# Patient Record
Sex: Female | Born: 1955
Health system: Southern US, Community
[De-identification: ages and names within clinical notes are randomized; demographics above are authoritative.]

## PROBLEM LIST (undated history)

## (undated) DIAGNOSIS — C801 Malignant (primary) neoplasm, unspecified: Secondary | ICD-10-CM

## (undated) DIAGNOSIS — M1990 Unspecified osteoarthritis, unspecified site: Secondary | ICD-10-CM

## (undated) DIAGNOSIS — Z3400 Encounter for supervision of normal first pregnancy, unspecified trimester: Secondary | ICD-10-CM

## (undated) DIAGNOSIS — Z8041 Family history of malignant neoplasm of ovary: Secondary | ICD-10-CM

## (undated) DIAGNOSIS — N189 Chronic kidney disease, unspecified: Secondary | ICD-10-CM

## (undated) DIAGNOSIS — E785 Hyperlipidemia, unspecified: Secondary | ICD-10-CM

## (undated) DIAGNOSIS — Z905 Acquired absence of kidney: Secondary | ICD-10-CM

## (undated) DIAGNOSIS — R002 Palpitations: Secondary | ICD-10-CM

## (undated) DIAGNOSIS — J45909 Unspecified asthma, uncomplicated: Secondary | ICD-10-CM

## (undated) DIAGNOSIS — Z8619 Personal history of other infectious and parasitic diseases: Secondary | ICD-10-CM

## (undated) DIAGNOSIS — Z8719 Personal history of other diseases of the digestive system: Secondary | ICD-10-CM

## (undated) DIAGNOSIS — T7840XA Allergy, unspecified, initial encounter: Secondary | ICD-10-CM

## (undated) DIAGNOSIS — E348 Other specified endocrine disorders: Secondary | ICD-10-CM

## (undated) DIAGNOSIS — M8580 Other specified disorders of bone density and structure, unspecified site: Secondary | ICD-10-CM

## (undated) DIAGNOSIS — Z803 Family history of malignant neoplasm of breast: Secondary | ICD-10-CM

## (undated) DIAGNOSIS — Z808 Family history of malignant neoplasm of other organs or systems: Secondary | ICD-10-CM

## (undated) DIAGNOSIS — H269 Unspecified cataract: Secondary | ICD-10-CM

## (undated) DIAGNOSIS — Z800 Family history of malignant neoplasm of digestive organs: Secondary | ICD-10-CM

## (undated) HISTORY — DX: Allergy, unspecified, initial encounter: T78.40XA

## (undated) HISTORY — DX: Personal history of other diseases of the digestive system: Z87.19

## (undated) HISTORY — DX: Unspecified cataract: H26.9

## (undated) HISTORY — DX: Family history of malignant neoplasm of other organs or systems: Z80.8

## (undated) HISTORY — DX: Malignant (primary) neoplasm, unspecified: C80.1

## (undated) HISTORY — DX: Hyperlipidemia, unspecified: E78.5

## (undated) HISTORY — DX: Unspecified osteoarthritis, unspecified site: M19.90

## (undated) HISTORY — DX: Encounter for supervision of normal first pregnancy, unspecified trimester: Z34.00

## (undated) HISTORY — DX: Family history of malignant neoplasm of ovary: Z80.41

## (undated) HISTORY — DX: Family history of malignant neoplasm of breast: Z80.3

## (undated) HISTORY — DX: Family history of malignant neoplasm of digestive organs: Z80.0

## (undated) HISTORY — DX: Other specified endocrine disorders: E34.8

## (undated) HISTORY — DX: Personal history of other infectious and parasitic diseases: Z86.19

## (undated) HISTORY — DX: Chronic kidney disease, unspecified: N18.9

## (undated) HISTORY — DX: Unspecified asthma, uncomplicated: J45.909

## (undated) HISTORY — DX: Other specified disorders of bone density and structure, unspecified site: M85.80

## (undated) HISTORY — DX: Acquired absence of kidney: Z90.5

---
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
## 1994-02-20 HISTORY — PX: ABDOMINAL HYSTERECTOMY: SHX81

## 1997-09-24 ENCOUNTER — Ambulatory Visit (HOSPITAL_COMMUNITY): Admission: RE | Admit: 1997-09-24 | Discharge: 1997-09-24 | Payer: Self-pay | Admitting: *Deleted

## 1999-02-21 HISTORY — PX: CHOLECYSTECTOMY: SHX55

## 1999-09-27 ENCOUNTER — Encounter: Payer: Self-pay | Admitting: Obstetrics and Gynecology

## 1999-09-27 ENCOUNTER — Encounter: Admission: RE | Admit: 1999-09-27 | Discharge: 1999-09-27 | Payer: Self-pay | Admitting: Obstetrics and Gynecology

## 1999-11-25 ENCOUNTER — Ambulatory Visit (HOSPITAL_COMMUNITY): Admission: RE | Admit: 1999-11-25 | Discharge: 1999-11-25 | Payer: Self-pay

## 1999-11-25 ENCOUNTER — Encounter (INDEPENDENT_AMBULATORY_CARE_PROVIDER_SITE_OTHER): Payer: Self-pay | Admitting: *Deleted

## 1999-12-21 ENCOUNTER — Encounter: Payer: Self-pay | Admitting: Urology

## 1999-12-27 ENCOUNTER — Encounter: Payer: Self-pay | Admitting: Urology

## 1999-12-27 ENCOUNTER — Ambulatory Visit (HOSPITAL_COMMUNITY): Admission: RE | Admit: 1999-12-27 | Discharge: 1999-12-27 | Payer: Self-pay | Admitting: Urology

## 2000-01-23 ENCOUNTER — Encounter: Payer: Self-pay | Admitting: Urology

## 2000-01-23 ENCOUNTER — Ambulatory Visit (HOSPITAL_COMMUNITY): Admission: RE | Admit: 2000-01-23 | Discharge: 2000-01-23 | Payer: Self-pay | Admitting: Urology

## 2000-02-21 HISTORY — PX: OTHER SURGICAL HISTORY: SHX169

## 2000-03-05 ENCOUNTER — Encounter: Payer: Self-pay | Admitting: Urology

## 2000-03-05 ENCOUNTER — Ambulatory Visit (HOSPITAL_COMMUNITY): Admission: RE | Admit: 2000-03-05 | Discharge: 2000-03-05 | Payer: Self-pay | Admitting: Urology

## 2000-04-24 ENCOUNTER — Other Ambulatory Visit: Admission: RE | Admit: 2000-04-24 | Discharge: 2000-04-24 | Payer: Self-pay | Admitting: Obstetrics and Gynecology

## 2002-07-29 ENCOUNTER — Other Ambulatory Visit: Admission: RE | Admit: 2002-07-29 | Discharge: 2002-07-29 | Payer: Self-pay | Admitting: Obstetrics and Gynecology

## 2004-02-21 HISTORY — PX: APPENDECTOMY: SHX54

## 2004-04-01 ENCOUNTER — Encounter (INDEPENDENT_AMBULATORY_CARE_PROVIDER_SITE_OTHER): Payer: Self-pay | Admitting: Specialist

## 2004-04-01 ENCOUNTER — Inpatient Hospital Stay (HOSPITAL_COMMUNITY): Admission: EM | Admit: 2004-04-01 | Discharge: 2004-04-05 | Payer: Self-pay | Admitting: Emergency Medicine

## 2005-05-29 ENCOUNTER — Ambulatory Visit: Payer: Self-pay | Admitting: Internal Medicine

## 2005-06-16 ENCOUNTER — Ambulatory Visit: Payer: Self-pay

## 2005-12-28 ENCOUNTER — Ambulatory Visit: Payer: Self-pay | Admitting: Internal Medicine

## 2005-12-28 LAB — CONVERTED CEMR LAB
ALT: 25 units/L (ref 0–40)
Albumin: 4.1 g/dL (ref 3.5–5.2)
BUN: 21 mg/dL (ref 6–23)
Basophils Absolute: 0 10*3/uL (ref 0.0–0.1)
Basophils Relative: 0.7 % (ref 0.0–1.0)
CO2: 29 meq/L (ref 19–32)
Calcium: 9.5 mg/dL (ref 8.4–10.5)
Chloride: 107 meq/L (ref 96–112)
Chol/HDL Ratio, serum: 3.3
Cholesterol: 173 mg/dL (ref 0–200)
Creatinine, Ser: 0.9 mg/dL (ref 0.4–1.2)
Eosinophil percent: 4.2 % (ref 0.0–5.0)
Free T4: 0.6 ng/dL — ABNORMAL LOW (ref 0.9–1.8)
Glomerular Filtration Rate, Af Am: 86 mL/min/{1.73_m2}
HDL: 52.7 mg/dL (ref >39.0)
Monocytes Relative: 9 % (ref 3.0–11.0)
Neutro Abs: 3.9 10*3/uL (ref 1.4–7.7)
Potassium: 4.2 meq/L (ref 3.5–5.1)
RBC: 4.05 M/uL (ref 3.87–5.11)
Sodium: 142 meq/L (ref 135–145)
Total Protein: 6.9 g/dL (ref 6.0–8.3)
Triglyceride fasting, serum: 88 mg/dL (ref 0–149)

## 2006-02-05 ENCOUNTER — Ambulatory Visit: Payer: Self-pay | Admitting: Internal Medicine

## 2007-01-01 ENCOUNTER — Ambulatory Visit: Payer: Self-pay | Admitting: Internal Medicine

## 2007-09-26 ENCOUNTER — Telehealth (INDEPENDENT_AMBULATORY_CARE_PROVIDER_SITE_OTHER): Payer: Self-pay | Admitting: *Deleted

## 2007-10-23 ENCOUNTER — Telehealth (INDEPENDENT_AMBULATORY_CARE_PROVIDER_SITE_OTHER): Payer: Self-pay | Admitting: *Deleted

## 2007-11-15 ENCOUNTER — Encounter: Payer: Self-pay | Admitting: Internal Medicine

## 2007-12-31 ENCOUNTER — Encounter: Payer: Self-pay | Admitting: Internal Medicine

## 2008-01-02 ENCOUNTER — Encounter: Payer: Self-pay | Admitting: Internal Medicine

## 2008-01-28 ENCOUNTER — Ambulatory Visit: Payer: Self-pay | Admitting: Internal Medicine

## 2008-01-28 DIAGNOSIS — J309 Allergic rhinitis, unspecified: Secondary | ICD-10-CM | POA: Insufficient documentation

## 2008-01-28 DIAGNOSIS — J029 Acute pharyngitis, unspecified: Secondary | ICD-10-CM | POA: Insufficient documentation

## 2008-01-28 DIAGNOSIS — M79609 Pain in unspecified limb: Secondary | ICD-10-CM

## 2008-01-28 DIAGNOSIS — M8580 Other specified disorders of bone density and structure, unspecified site: Secondary | ICD-10-CM

## 2008-02-05 ENCOUNTER — Ambulatory Visit: Payer: Self-pay | Admitting: Internal Medicine

## 2008-02-10 ENCOUNTER — Encounter: Payer: Self-pay | Admitting: Internal Medicine

## 2008-02-10 LAB — CONVERTED CEMR LAB
ALT: 25 units/L (ref 0–35)
AST: 25 units/L (ref 0–37)
Albumin: 4 g/dL (ref 3.5–5.2)
Alkaline Phosphatase: 54 units/L (ref 39–117)
BUN: 15 mg/dL (ref 6–23)
Basophils Absolute: 0 10*3/uL (ref 0.0–0.1)
Chloride: 109 meq/L (ref 96–112)
Eosinophils Relative: 5.1 % — ABNORMAL HIGH (ref 0.0–5.0)
GFR calc Af Amer: 114 mL/min
GFR calc non Af Amer: 94 mL/min
Glucose, Bld: 98 mg/dL (ref 70–99)
HCT: 35.9 % — ABNORMAL LOW (ref 36.0–46.0)
Lymphocytes Relative: 42.8 % (ref 12.0–46.0)
MCHC: 35.1 g/dL (ref 30.0–36.0)
MCV: 99.1 fL (ref 78.0–100.0)
Monocytes Relative: 8.1 % (ref 3.0–12.0)
Platelets: 206 10*3/uL (ref 150–400)
RBC: 3.62 M/uL — ABNORMAL LOW (ref 3.87–5.11)
RDW: 12.6 % (ref 11.5–14.6)
Sodium: 145 meq/L (ref 135–145)
Total CHOL/HDL Ratio: 2.6
Total Protein: 6.8 g/dL (ref 6.0–8.3)

## 2008-03-18 ENCOUNTER — Ambulatory Visit: Payer: Self-pay | Admitting: Gastroenterology

## 2008-03-19 ENCOUNTER — Ambulatory Visit: Payer: Self-pay | Admitting: Sports Medicine

## 2008-03-19 DIAGNOSIS — M216X9 Other acquired deformities of unspecified foot: Secondary | ICD-10-CM

## 2008-03-19 DIAGNOSIS — M722 Plantar fascial fibromatosis: Secondary | ICD-10-CM

## 2008-04-01 ENCOUNTER — Ambulatory Visit: Payer: Self-pay | Admitting: Gastroenterology

## 2008-04-01 LAB — HM COLONOSCOPY

## 2008-06-18 ENCOUNTER — Telehealth (INDEPENDENT_AMBULATORY_CARE_PROVIDER_SITE_OTHER): Payer: Self-pay | Admitting: *Deleted

## 2008-12-21 LAB — HM MAMMOGRAPHY: HM Mammogram: NORMAL

## 2009-08-24 ENCOUNTER — Emergency Department (HOSPITAL_COMMUNITY): Admission: EM | Admit: 2009-08-24 | Discharge: 2009-08-24 | Payer: Self-pay | Admitting: Family Medicine

## 2009-08-30 ENCOUNTER — Telehealth: Payer: Self-pay | Admitting: *Deleted

## 2009-10-13 ENCOUNTER — Encounter: Payer: Self-pay | Admitting: Internal Medicine

## 2009-12-29 ENCOUNTER — Telehealth: Payer: Self-pay | Admitting: *Deleted

## 2009-12-29 ENCOUNTER — Ambulatory Visit: Payer: Self-pay | Admitting: Internal Medicine

## 2009-12-29 LAB — CONVERTED CEMR LAB
ALT: 22 units/L (ref 0–35)
AST: 22 units/L (ref 0–37)
Albumin: 4.2 g/dL (ref 3.5–5.2)
Alkaline Phosphatase: 62 units/L (ref 39–117)
BUN: 21 mg/dL (ref 6–23)
Basophils Relative: 0.5 % (ref 0.0–3.0)
Calcium: 9.9 mg/dL (ref 8.4–10.5)
Cholesterol: 170 mg/dL (ref 0–200)
GFR calc non Af Amer: 71.5 mL/min (ref >60)
HCT: 41 % (ref 36.0–46.0)
HDL: 66.2 mg/dL (ref >39.00)
Lymphs Abs: 2.7 10*3/uL (ref 0.7–4.0)
MCHC: 33.9 g/dL (ref 30.0–36.0)
Monocytes Relative: 6.7 % (ref 3.0–12.0)
Neutro Abs: 5.6 10*3/uL (ref 1.4–7.7)
RBC: 4.11 M/uL (ref 3.87–5.11)
RDW: 13.8 % (ref 11.5–14.6)
Specific Gravity, Urine: 1.02
Total Bilirubin: 0.4 mg/dL (ref 0.3–1.2)
Total Protein: 6.6 g/dL (ref 6.0–8.3)
Triglycerides: 82 mg/dL (ref 0.0–149.0)
Urobilinogen, UA: 0.2
VLDL: 16.4 mg/dL (ref 0.0–40.0)
WBC: 9.2 10*3/uL (ref 4.5–10.5)
pH: 5

## 2010-01-05 ENCOUNTER — Encounter: Payer: Self-pay | Admitting: Internal Medicine

## 2010-01-05 ENCOUNTER — Ambulatory Visit: Payer: Self-pay | Admitting: Internal Medicine

## 2010-01-05 DIAGNOSIS — R209 Unspecified disturbances of skin sensation: Secondary | ICD-10-CM

## 2010-01-05 DIAGNOSIS — R6882 Decreased libido: Secondary | ICD-10-CM

## 2010-01-12 LAB — CONVERTED CEMR LAB: Folate: >20 ng/mL

## 2010-02-03 ENCOUNTER — Encounter: Payer: Self-pay | Admitting: Internal Medicine

## 2010-03-04 ENCOUNTER — Ambulatory Visit
Admission: RE | Admit: 2010-03-04 | Discharge: 2010-03-04 | Payer: Self-pay | Source: Home / Self Care | Attending: Family Medicine | Admitting: Family Medicine

## 2010-03-24 NOTE — Assessment & Plan Note (Signed)
Summary: cpx/no pap/njr   Vital Signs:  Patient profile:   55 year old female Menstrual status:  hysterectomy Height:      64.25 inches Weight:      134 pounds BMI:     22.90 Pulse rate:   72 / minute BP sitting:   100 / 70  (right arm) Cuff size:   regular  Vitals Entered By: Romualdo Bolk, CMA (AAMA) (January 05, 2010 11:28 AM) CC: CPX     Menstrual Status hysterectomy   History of Present Illness: Maria Kane comes in for a preventive visit today. She has a list of concerns and questions. 1. She has decrease libido question about her hormone replacement. She had  complete      hysterectomy    age 17 .   and since that time has been on  low dose hrt   two times a week  for years  . when she tookhigher dose   got vertigo.  she's been tolerating Vivelle dot for a while.  Wondered if different formants would help libido. She denies any physical pain does have some depression that she treats with over-the-counter CME does not want to take prescription medicine for this. Does [place] exercise recently.  Bone healtth: Due for  bone  density in a few weeks.  Left foot:  has had some pain in her left lateral foot with numbness along. Outside for a while did see Dr. Darrick Penna in the past who gave her an insert but it isn't better. She did not go back for follow-up Solitary kidney:  assess funcxtion. Numbness in left hand palm at times. She takes a sublingual B vitamin and it gets better for a while. There is no weakness balance problems. Current vertigo. She asks if B12 shots would be helpful. Hx of pineal cyst : incidental finding on scan per Dr. Jule Ser. She asks if it could be causing a sleep problem. Review of her supplements include fish oil multivitamins CMA B12 to baby aspirins that she takes because she read it would be good for her and melatonin 18 mg at night.  Preventive Care Screening  Mammogram:    Date:  12/21/2008    Results:  normal   Last Tetanus Booster:  Date:  02/21/2003    Results:  Historical    Preventive Screening-Counseling & Management  Alcohol-Tobacco     Alcohol drinks/day: 1     Alcohol type: wine     Smoking Status: never  Caffeine-Diet-Exercise     Caffeine use/day: 2     Does Patient Exercise: yes     Type of exercise: Yoga. wts and cardio     Times/week: 4  Hep-HIV-STD-Contraception     Dental Visit-last 6 months yes     Sun Exposure-Excessive: no  Safety-Violence-Falls     Seat Belt Use: yes     Firearms in the Home: no firearms in the home     Smoke Detectors: yes     Fall Risk: no      Blood Transfusions:  no.    Current Medications (verified): 1)  Vivelle-Dot 0.025 Mg/24hr Pttw (Estradiol) 2)  Claritin 10 Mg Tabs (Loratadine)  Allergies (verified): No Known Drug Allergies  Past History:  Past medical, surgical, family and social histories (including risk factors) reviewed, and no changes noted (except as noted below).  Past Medical History: Allergic rhinitis   dogs and dust  tree ragweek.  Osteopenia    dexa 11/09 -1.4 spine , -1.2 hip  Primiparous Chicken pox as a child Last PAP 2008 Mammo 11/09 Single  kidney   Cyst on pineal gland  incidental finding.   CONSULTANTS  Kimb[facility] Joycie Peek  Past Surgical History: Reviewed history from 01/28/2008 and no changes required. Appendectomy 2006 Cholecystectomy2001 Hysterectomy   Total severe endometriosis  39 1996 Tonsillectomy1965 Left Kidney removed Congential UPJ obstruction 2002.   Past History:  Care Management: Gastroenterology: [facility] Dermatology: Yetta Barre Gynecology: Rosalio Macadamia Allergist : sharma   Family History: Reviewed history from 01/28/2008 and no changes required. Family History Breast cancer 1st degree relative <50-Mother Family History Diabetes 1st degree relative- Brother,sister, maternal aunts and grandfather Family History Hypertension-brother, sister and father Family History Lung cancer-  father Family History Psychiatric care- 1/2 brother- schizopherenia Family History of Stroke M 1st degree relative <50- father, maternal grandfather Family History of Cardiovascular disorder- father-pacemaker Family History of Osteoarthritis-mother and maternal aunts Family History of Polymyositis-sister Father: Lung cancer died 51 Mother:  MS   died at 30 from pneumonia Siblings:  10 sibs   see above   Social History: Reviewed history from 01/28/2008 and no changes required. Occupation: Ph.D- Location manager Married HH of 4   Never Smoked Alcohol use-yes  3  2 x per week.  Drug use-no Regular exercise-yes 2 adopted kids  2 dog s  originally from [place] Caffeine use/day:  2 Seat Belt Use:  yes Sun Exposure-Excessive:  no Dental Care w/in 6 mos.:  yes Fall Risk:  no Blood Transfusions:  no  Review of Systems  The patient denies anorexia, fever, weight loss, vision loss, decreased hearing, hoarseness, chest pain, syncope, dyspnea on exertion, peripheral edema, prolonged cough, headaches, abdominal pain, severe indigestion/heartburn, hematuria, muscle weakness, transient blindness, enlarged lymph nodes, and angioedema.         numbness   left hand    off an on through out life  palm area. better with sublinqual b  helps.   see HPI  Physical Exam  General:  Well-developed,well-nourished,in no acute distress; alert,appropriate and cooperative throughout examination Head:  normocephalic, atraumatic, and no abnormalities observed.   Eyes:  PERRL, EOMs full, conjunctiva clear  Ears:  R ear normal, L ear normal, and no external deformities.   Nose:  no external deformity, no external erythema, and no nasal discharge.   Mouth:  good dentition and pharynx pink and moist.   Neck:  No deformities, masses, or tenderness noted. Chest Wall:  No deformities, masses, or tenderness noted. Breasts:  No mass, nodules, thickening, tenderness, bulging, retraction, inflamation, nipple  discharge or skin changes noted.   Lungs:  Normal respiratory effort, chest expands symmetrically. Lungs are clear to auscultation, no crackles or wheezes.no dullness.   Heart:  Normal rate and regular rhythm. S1 and S2 normal without gallop, murmur, click, rub or other extra sounds.no lifts.   Abdomen:  Bowel sounds positive,abdomen soft and non-tender without masses, organomegaly or hernias noted.  well healed scars. Genitalia:  per gyne Msk:  no joint swelling, no joint warmth, and no joint deformities.   Pulses:  pulses intact without delay   Extremities:  no clubbing cyanosis or edema  Neurologic:  alert & oriented X3, strength normal in all extremities, and gait normal.  decreased vibratory sense along the left lateral foot and toe. No atrophy or weakness. Skin:  turgor normal, color normal, no ecchymoses, and no petechiae.   Cervical Nodes:  No lymphadenopathy noted Axillary Nodes:  No palpable lymphadenopathy Inguinal Nodes:  No significant adenopathy  Psych:  Oriented X3, normally interactive, good eye contact, not anxious appearing, and not depressed appearing.     Impression & Recommendations:  Problem # 1:  PREVENTIVE HEALTH CARE (ICD-V70.0) continue     healthy lifestyle .   counseled   pineal cysts not  significant   Problem # 2:  LIBIDO, DECREASED (ICD-799.81) disc  complexity of problem   and usually not a simple  fix.  and risk of testosterone  now that she  is menopausal .     consider increase aerobi exercise  and other   can get gyne to evaluated if wishes.   Problem # 3:  MENOPAUSE, SURGICAL AGE 67 (ICD-627.4) to stay on same dose  . gets vertigo with higher doses  Her updated medication list for this problem includes:    Vivelle-dot 0.025 Mg/24hr Pttw (Estradiol)  Problem # 4:  HYPERPOTASSEMIA (ICD-276.7) probably lab drawing effect  Orders: TLB-Potassium (K+) (84132-K) Venipuncture (13086) Specimen Handling (57846)  Problem # 5:  NUMBNESS (ICD-782.0) seems  to be a local phenomenon on the left although there is decrease vibratory sense on her feet is reasonable to check a b12 level and MMA MMA Orders: TLB-B12 + Folate Pnl ([id number]-K44/WNU) T- * Misc. Laboratory test [id number]) Venipuncture [phone]) Specimen Handling (34742)  Problem # 6:  OSTEOPENIA (ICD-733.90) counseled   Problem # 7:  SOLITARY KIDNEY RIGHT (ICD-753.0) normal renal function reviewed avoiding nephrotoxic agents.  Problem # 8:  counseling supplement used discussed at length   minimalk evidence based clinical outcomes  of  data or lack of data behind fish oil vitamins B12  melatonin  etc.   Discussed risk benefit  .    she can continue as she feels appropriate.  It is much better to get nutrients in foods and in pills.   Complete Medication List: 1)  Vivelle-dot 0.025 Mg/24hr Pttw (Estradiol) 2)  Claritin 10 Mg Tabs (Loratadine) 3)  Melatonin 3 Mg Tabs (Melatonin) .... ? dose 4)  B12  5)  Fish Oil  6)  Aspirin 81 Mg Chew (Aspirin) .... 2 per day 7)  Herbal For Depression   Patient Instructions: 1)  increase exercise ( cross train because of foot) . 2)  Consider seeing Dr Darrick Penna again  . 3)  kidney function.   is good. 4)  Get your bone density. 5)   check B12 level and MMA   and let ou know results.   Orders Added: 1)  TLB-B12 + Folate Pnl [[id number]-B12/FOL] 2)  TLB-Potassium (K+) [84132-K] 3)  T- * Misc. Laboratory test [99999] 4)  Venipuncture [36415] 5)  Specimen Handling [99000] 6)  Est. Patient 40-64 years [99396] 7)  Est. Patient Level III [99213]   counseling prolonged  regarding above problems and questions.    Appended Document: Orders Update    Clinical Lists Changes  Orders: Added new Service order of EKG w/ Interpretation (93000) - Signed

## 2010-03-24 NOTE — Assessment & Plan Note (Signed)
Summary: ?eye inf/red, puffy and runny/cjr   Vital Signs:  Patient profile:   55 year old female Menstrual status:  hysterectomy Temp:     97.4 degrees F oral BP sitting:   122 / 74  (left arm) Cuff size:   regular  Vitals Entered By: Sid Falcon LPN (March 04, 2010 11:49 AM)  History of Present Illness: 2 week hx of some intermittent swelling and mostly pruritic rash below L eyelid.  No blurred vision, eye drainage, vesicles, hx of herpes, or any other rashes.  She has not tried anything topical.   Had some nonpainful soft tissue  swelling several days ago and that has resolved.  Takes claritan one daily.  Also some swelling R upper lip past couple of days.  No tongue edema. No dyspnea.  No hx of angioedema.  No swelling of hands or feet.  Allergies (verified): No Known Drug Allergies  Past History:  Past Medical History: Last updated: 01/05/2010 Allergic rhinitis   dogs and dust  tree ragweek.  Osteopenia    dexa 11/09 -1.4 spine , -1.2 hip  Primiparous Chicken pox as a child Last PAP 2008 Mammo 11/09 Single  kidney   Cyst on pineal gland  incidental finding.   CONSULTANTS  Kimb[facility] Joycie Peek PMH reviewed for relevance  Review of Systems  The patient denies anorexia, fever, weight loss, vision loss, chest pain, dyspnea on exertion, peripheral edema, and headaches.    Physical Exam  General:  Well-developed,well-nourished,in no acute distress; alert,appropriate and cooperative throughout examination Head:  Normocephalic and atraumatic without obvious abnormalities. No apparent alopecia or balding. Eyes:  pupils equal, pupils round, pupils reactive to light, pupils react to accomodation, corneas and lenses clear, no injection, no iris abnormalities, and no retinal abnormalitiies.   Ears:  External ear exam shows no significant lesions or deformities.  Otoscopic examination reveals clear canals, tympanic membranes are intact bilaterally without  bulging, retraction, inflammation or discharge. Hearing is grossly normal bilaterally. Mouth:  Oral mucosa and oropharynx without lesions or exudates.  Teeth in good repair. Neck:  No deformities, masses, or tenderness noted. Lungs:  Normal respiratory effort, chest expands symmetrically. Lungs are clear to auscultation, no crackles or wheezes. Heart:  normal rate and regular rhythm.   Skin:  under L eye mildly scaly rash with mild erythema. covers area about 1 by 1 cm  Conjunctiva normal. No vesicles or pustules.   Impression & Recommendations:  Problem # 1:  SKIN RASH (ICD-782.1) appears more contact allergy related.  Triamcinolone cream with care to avoid eyes.  She will consider change to Allegra. Her updated medication list for this problem includes:    Triamcinolone Acetonide 0.1 % Crea (Triamcinolone acetonide) .Marland Kitchen... Apply to affected rash two times a day as needed  Complete Medication List: 1)  Vivelle-dot 0.025 Mg/24hr Pttw (Estradiol) 2)  Claritin 10 Mg Tabs (Loratadine) 3)  Melatonin 3 Mg Tabs (Melatonin) .... ? dose 4)  B12  5)  Fish Oil  6)  Aspirin 81 Mg Chew (Aspirin) .... 2 per day 7)  Herbal For Depression  8)  Triamcinolone Acetonide 0.1 % Crea (Triamcinolone acetonide) .... Apply to affected rash two times a day as needed  Patient Instructions: 1)  Touch base in 1-2 weeks if no better Prescriptions: TRIAMCINOLONE ACETONIDE 0.1 % CREA (TRIAMCINOLONE ACETONIDE) apply to affected rash two times a day as needed  #15 gm x 0   Entered and Authorized by:   Evelena Peat MD  Signed by:   Evelena Peat MD on 03/04/2010   Method used:   Electronically to        [facility] [id number] (retail)       [address] [place], [place]  [address]       Ph: [phone]       Fax: [phone]   RxID:   [id number]    Orders Added: 1)  Est. Patient Level III [16606]

## 2010-03-24 NOTE — Consult Note (Signed)
Summary: [facility]  [facility]   Imported By: Sherian Rein 10/27/2009 12:20:13  _____________________________________________________________________  External Attachment:    Type:   Image     Comment:   External Document

## 2010-03-24 NOTE — Progress Notes (Signed)
Summary: Wants a hormone level checked  Phone Note Call from Patient Call back at Home Phone [phone]   Caller: Patient Summary of Call: Pt is wanting a hormone panel done. Pt had a hysterectomy and is having problems. She thinks that her hormone needs to be adjusted. Pt had labs done today. Initial call taken by: Romualdo Bolk, CMA Duncan Dull),  December 29, 2009 10:39 AM  Follow-up for Phone Call        what are her signs   if hot flushes  etc  we can  check [facility] and estradiol level on to her labs and discuss at follow up .  Follow-up by: Madelin Headings MD,  December 29, 2009 2:06 PM  Additional Follow-up for Phone Call Additional follow up Details #1::        Spoke to pt and she is having increased hot flashed and no sex drive. Labs ordered.  Additional Follow-up by: Romualdo Bolk, CMA Duncan Dull),  December 29, 2009 2:41 PM

## 2010-03-24 NOTE — Progress Notes (Signed)
Summary: referral  Phone Note Call from Patient Call back at [phone]   Summary of Call: rEFERRAL TO ALLERGIST for seasonal & environmental, severe.  Does not want to come here first.   Initial call taken by: Rudy Jew, RN,  August 30, 2009 11:18 AM  Follow-up for Phone Call        ok to refer   to allergist   but she may be able to make her own appt.  see ed visit  also  Follow-up by: Madelin Headings MD,  August 30, 2009 5:08 PM  Additional Follow-up for Phone Call Additional follow up Details #1::        Referral sent to [facility]. Left message on machine about this. Additional Follow-up by: Romualdo Bolk, CMA Duncan Dull),  August 30, 2009 5:31 PM
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
## 2010-07-08 NOTE — Op Note (Signed)
Maria Kane, Maria Kane                 ACCOUNT NO.:  [id number]   MEDICAL RECORD NO.:  [id number]          PATIENT TYPE:  INP   LOCATION:  0102                         FACILITY[facility]  [facility]   PHYSICIAN:  Anselm Pancoast. Weatherly, M.D.DATE OF BIRTH:  [birth year]   DATE OF PROCEDURE:  04/01/2004  DATE OF DISCHARGE:                                 OPERATIVE REPORT   PREOPERATIVE DIAGNOSIS:  Probably ruptured appendicitis.   POSTOPERATIVE DIAGNOSIS:  Ruptured gangrenous appendicitis.   OPERATION:  Open appendectomy and laparotomy.   ANESTHESIA:  General.   SURGEON:  Anselm Pancoast. Zachery Dakins, M.D.   ASSISTANT:  Nurse.   HISTORY:  Gwynneth Fabio is a 55 year old female who was referred from [facility] today with the following history:  She said for about 3-4 days,  she has had been kind of vaguely feeling poor, just thought she was run  down.  Then yesterday started having more intense pain.  Awoke this morning  with generalized, very severe lower abdominal pain.  Her past history is  extensive in that she has had problems with extensive endometriosis and had  an abdominal hysterectomy and bilateral salpingo-oophorectomy.  She had a  cholecystectomy shortly before and then developed a left kidney obstruction  problem that required a nephrectomy.  She is sent over to the emergency room  and on examination, she was definitely tender, all in the lower right  abdomen, but really did not give a history of nausea and vomiting and then  shift into the right lower quadrant.  She has multiple scars from her  multiple abdominal surgeries, mostly laparoscopic but a midline incision.  I  take that it is necessary to obtain a CAT scan.  I was certainly concerned  this could be perforated diverticulitis.  We talked to the radiologist and  did a Gastrografin CAT scan, and it showed a gangrenous appendix, probably  ruptured, really in the pelvis anterior to the rectum.  She had been started  on Unasyn and  permission obtained for an open appendectomy.  I take with the  previous multiple surgeries and her position of the appendix, it would be  next to impossible to try to do her laparoscopically.   The patient has PAS stockings.  Positioned on the OR table.  Induction of  general anesthesia.  A Foley catheter was inserted, and then the abdomen was  prepped with Betadine surgical scrub and solution.  She did not have a mass  when we were prepping her.   The small midline incision was made in the old incision from her  hysterectomy, about half the size of her hysterectomy incision, and then  down through the two layers of muscles, opening very carefully into the  peritoneal cavity, there was a draining fluid within the abdomen.  Working  from the left side, I could see down into the pelvis, and she had a very  long, gangrenous appendix that looked like she had blown out the tip.  I  first kind of pulled the cecum up and then actually ligated the base of the  appendix  with a 2-0 Vicryl and then a purse-string of 3-0 silk and the stump  inverted.  Next, the mesentery was then divided  between [facility].  These were  ligated with either 2-0 Vicryl and 3-0 silk sutures with good hemostasis.  After the gangrenous appendix removed, I then cultured the fluid aerobically  and anaerobically, and then with about four liters of irrigated fluid, just  washed and washed until all of the returns are clear.  I then reinserted and  looked at the cecal appendix base, and there was good hemostasis.  Since  this is a generalized peritonitis and not really a localized abscess, I did  not place any drains.  There was a little omentum down under the incision,  and then I closed the peritoneum and posterior rectus with a running 2-0 and  0 PDS, and then the anterior fascia, I closed with interrupted sutures of 0  Prolene with knots inverted.  I loosely approximated the skin with staples.  The patient tolerated the  procedure nicely.  We have continued her on the  Unasyn and Flagyl, awaiting results of the culture.  I am going to try to  use no NG tube and keep her n.p.o.  I will plan on removing the Foley in the  morning.  Sponge and needle counts were correct.  Estimated blood loss was  minimal.  Cultures are pending.      WJW/MEDQ  D:  04/01/2004  T:  04/01/2004  Job:  [id number]   cc:   Sharlet Salina, M.D.  [address]  [place]  [place] [address]  Fax: [phone]

## 2010-07-08 NOTE — Discharge Summary (Signed)
[name], Kane                 ACCOUNT NO.:  [id number]   MEDICAL RECORD NO.:  [id number]          PATIENT TYPE:  INP   LOCATION:  0472                         FACILITY:  [facility]   PHYSICIAN:  Anselm Pancoast. Weatherly, M.D.DATE OF BIRTH:  [birth year]   DATE OF ADMISSION:  04/01/2004  DATE OF DISCHARGE:  04/05/2004                                 DISCHARGE SUMMARY   DISCHARGE DIAGNOSIS:  Gangrenous appendicitis, probably an early  perforation.   OPERATION:  Open appendectomy.   HISTORY AND HOSPITAL COURSE:  Maria Kane is a 55 year old Caucasian female  who gives the following history:  She says for 2-3 days, she has been  feeling poorly, not really able to localize the pain in any particular  place, maybe some bowel irregularity, and then she had significantly  increasing pain on the morning of her surgery and was seen at the [facility] [place], which she described as having a very rigid lower abdomen,  very tender, very high fever, and an elevated white count.  I was called.  She was sent to the emergency room.  She really did not give a real good  history of epigastric pain shifting to the lower abdomen but had been  feeling poorly for a few days.  Her past history is significant in that she  had a previous hysterectomy and bilateral salpingo-oophorectomy for  endometriosis back in 1997.  She had a cholecystectomy by Dr. Orson Slick in 2002  and then a left nephrectomy in the spring of 2002 for chronic UVJ  obstruction.  Meds are trazodone p.r.n. sleep, estrogen replacement,  __________ 150 mg.  Dr. Marny Lowenstein had seen her in the office.  In the emergency  room here, she did have a fever of about 102.  White count at 22,000.  I  discussed with her about letting us do a CAT scan with rectal contrast  because I was really concerned that this was possibly a diverticulitis  perforation with her degree of tenderness and how low it was; however, the  CT looked like the appendix is basically  right anterior to the rectum where  the uterus used to be.  It did appear to be inflamed with a lot of fluid  around it.  I felt that most likely she had a ruptured appendicitis.  I  recommended that we do an open appendectomy through a lower midline  incision, and she was in agreement.  She was started on Unasyn and taken to  surgery.  The patient did have a lot of fluid in the peri-appendiceal area  that was cultured.  The appendix was definitely gangrenous with what looked  like to me it had ruptured at the tip, but the fluid did not grow bacteria,  and the path report is that of a markedly acutely inflamed suppurative  appendix.  She did nicely, and over about 24 hours later, her white count  was down to 11,200.  She did have a nasogastric tube initially, but this was  removed.  Then she was started on a liquid diet, advanced, and had a  lot of  GI-type complaints but at no time was she distended or in a partially  obstructed type of area.  I was ready to discharge her on the 14th on oral  Augmentin 875 b.i.d. for approximately 5 days and Darvocet for pain.  Her  urine and kidney function remained normal during this hospitalization.  She  will see [place] at the office for removal of the staples in 4-5 days.  She is in  remarkably improved condition at the time of discharge.      WJW/MEDQ  D:  04/19/2004  T:  04/19/2004  Job:  [id number]   cc:   Deboraha Sprang at [facility]

## 2010-07-08 NOTE — Op Note (Signed)
[place]  Patient:    MEKENNA, FINAU                          MRN: [id number] Proc. Date: 11/25/99 Adm. Date:  [id number] Disc. Date: [id number] Attending:  Meredith Leeds                           Operative Report  PREOPERATIVE DIAGNOSIS:  Symptomatic gallstones.  POSTOPERATIVE DIAGNOSIS:  Symptomatic gallstones.  OPERATION:  Laparoscopic cholecystectomy.  SURGEON:  Zigmund Daniel, M.D.  ASSISTANT:  Catalina Lunger, M.D.  ANESTHESIA:  General.  DESCRIPTION OF PROCEDURE:  After adequate monitoring and general anesthesia and routine preparation and draping of the abdomen, I made a short transverse incision just below the umbilicus at the site of a previous laparoscopy.  I dissected down through the scar and fat until I encountered fascia, and I opened it longitudinally and opened the peritoneum sharply and dilated the hole with my finger.  There were no adhesions in the region.  I put in a purse-string 0 Vicryl suture and secured a Hasson cannula and inflated the abdomen with CO2.  No abnormalities were evident on laparoscopy except for some adhesions from previous surgery in the pelvis and a few adhesions of fat to the undersurface of the liver.  There was a large stone evident in the fundus of the gallbladder with a small area of a waist of the gallbladder just below that, and the gallbladder was slightly distended.  The liver looked normal.  I put in three additional ports and retracted the fundus of the gallbladder upward and the infundibulum of the gallbladder laterally.  The biliary anatomy was quite evident, as the patient was very thin.  I dissected out the cystic duct and the cystic artery.  I securely clipped the cystic duct with four clips and cut between the two closest to the gallbladder, and I clipped the cystic artery with three clips and cut between the two closest to the gallbladder.  I then used the hook and  spatula cautery instruments to dissect the gallbladder from the liver, utilizing the cautery for hemostasis as well.  Hemostasis was excellent.  There was no evident leakage of bile.  I briefly irrigated the area and removed the irrigant.  I detached the gallbladder from the liver and removed it from the body through the umbilical incision then tied the purse-string suture.  It came out intact with a single palpable gallstone.  I then checked the area again for hemostasis and anesthetized all the incisions and removed the ports under direct vision and saw that there was no bleeding.  I released the CO2 from the abdomen and removed the last port and then closed the skin of all incisions with intracuticular 4-0 Vicryl and Steri-Strips.  The patient was stable through the operation. DD:  11/25/99 TD:  11/25/99 Job: [id number]

## 2010-07-08 NOTE — Op Note (Signed)
[facility]  Patient:    Maria Kane, Maria Kane                      MRN: [id number] Proc. Date: 01/23/00 Adm. Date:  [id number] Attending:  Thermon Leyland CC:         [name], M.D.  Stacie Glaze, M.D. [facility]   Operative Report  PREOPERATIVE DIAGNOSES: 1. Left recurrent flank pain. 2. Left hydronephrosis. 3. Ureteral pelvic junction obstruction.  POSTOPERATIVE DIAGNOSES: 1. Left recurrent flank pain. 2. Left hydronephrosis. 3. Ureteral pelvic junction obstruction.  PROCEDURE:  Cystoscopy, left retrograde pyelography, flexible ureteroscopy, double J stent placement.  SURGEON:  Dr. Isabel Caprice.  ANESTHESIA:  General.  INDICATIONS FOR PROCEDURE:  Maria Kane is a 55 year old female. She has had a fairly extensive evaluation because of some intermittent left flank discomfort. She has been noted to have a hydronephrosis of the left kidney. Initially, she was noted to have some mild renal atrophy and some parenchymal thinning although it did not look particularly severe. An intravenous pyelogram showed what appeared to be a dilated renal pelvis and caliceal system with findings consistent with UPJ obstruction. A nuclear Lasix renal scan did show evidence of obstruction. Differential perfusion suggested poor function of the left kidney with 80/20 split, right to left. We performed a special CT scan with the UPJ protocol and it was felt that she did have an accessory or secondary renal artery crossing right at the area of the ureteral pelvic junction. We recommended to the patient that she have retrograde pyelography and double J stent placement. We felt it would then be indicated to repeat a renal scan in 6-8 weeks to see if she had any recoverable function. in the interim, she had seen Dr. Aldean Ast for a second opinion who apparently gave fairly similar advice to the patient although I did not obtain any notes from his office. The  patient presents now for that evaluation and stent placement.  TECHNIQUE AND FINDINGS:  The patient was brought to the operating room where she had the successful induction of general endotracheal anesthesia. She was placed in lithotomy position and prepped and draped in the usual manner. Cystoscopy revealed an unremarkable urethra and bladder. Retrograde pyelogram on the left showed a fairly normal caliber ureter. Initially, we thought we saw a jet of contrast going through the ureteral pelvic junction but with some additional pressure that area did distend reasonably well. We did not see any obvious narrowing. The renal pelvis and caliceal system were fairly dilated. As we continued with the retrograde pyelogram, the renal pelvis and caliceal system really became markedly distended. Again at times, there was a suggestion of some mild extrinsic compression in that area but with additional contrast, we could not define a definite area of narrowing. I was able to place a guidewire up to the left renal pelvis without difficulty. I perfused a 12/14 French access sheath which was placed in the ureter without difficulty. A microflexible ureteroscope was then inserted and able to be put right through the ureteral pelvic junction into the renal pelvis. Upon withdrawing the flexible ureteroscope, we were able to carefully examine the ureteral pelvic junction area. I did not appreciate any obvious stenotic area and certainly there was not a tight UPJ. There did not appear to be any evidence of a high insertion. Its quite conceivable, however, that there could be some extrinsic compression from the vessel that would be difficult to appreciate on  ureteroscopy and certainly may change depending on the patients positioning. I did not see any other evidence of any other ureteral pathology. Over the guidewire, a 7 French 24 cm double J stent was placed without difficulty. This was confirmed to be in good  position with fluoroscopic imaging. The patient appeared to tolerated the procedure well with no obvious complications. She was brought to the recovery room in stable condition. DD:  01/23/00 TD:  01/23/00 Job: [id number]

## 2010-07-25 ENCOUNTER — Telehealth: Payer: Self-pay | Admitting: *Deleted

## 2010-07-25 MED ORDER — ESTRADIOL 0.025 MG/24HR TD PTTW: 1.0000 | MEDICATED_PATCH | TRANSDERMAL | Status: DC

## 2010-07-25 NOTE — Telephone Encounter (Signed)
Pt is changing all her GYN to Dr. Fabian Sharp and needs a [place] Patch  025 mg. calle to [facility] [phone]

## 2010-09-20 ENCOUNTER — Encounter: Payer: Self-pay | Admitting: Internal Medicine

## 2010-09-20 DIAGNOSIS — J309 Allergic rhinitis, unspecified: Secondary | ICD-10-CM | POA: Insufficient documentation

## 2010-09-21 ENCOUNTER — Encounter: Payer: Self-pay | Admitting: Internal Medicine

## 2010-09-21 ENCOUNTER — Ambulatory Visit (INDEPENDENT_AMBULATORY_CARE_PROVIDER_SITE_OTHER): Payer: Self-pay | Admitting: Internal Medicine

## 2010-09-21 VITALS — BP 100/60 | HR 66 | Wt 137.0 lb

## 2010-09-21 DIAGNOSIS — E8940 Asymptomatic postprocedural ovarian failure: Secondary | ICD-10-CM

## 2010-09-21 DIAGNOSIS — N959 Unspecified menopausal and perimenopausal disorder: Secondary | ICD-10-CM

## 2010-09-21 DIAGNOSIS — J309 Allergic rhinitis, unspecified: Secondary | ICD-10-CM

## 2010-09-21 DIAGNOSIS — Z8742 Personal history of other diseases of the female genital tract: Secondary | ICD-10-CM

## 2010-09-21 MED ORDER — ESTRADIOL 0.0375 MG/24HR TD PTTW: 1.0000 | MEDICATED_PATCH | TRANSDERMAL | Status: DC

## 2010-09-21 NOTE — Patient Instructions (Signed)
Continue lifestyle intervention healthy eating and exercise . Try increase dose of estrogen replacement. Call if  Needed about dosing.

## 2010-09-21 NOTE — Progress Notes (Signed)
  Subjective:    Patient ID: Maria Kane, female    DOB: 1955/07/17, 55 y.o.   MRN: [id number]  HPI Comes in today  For a gyne exam and med evaluation. Her GYne has retired and she is on HRT. For hx of surgical menopause from  Endometriosis rx. Still has some  Hot flushes   Come before change patches .   ?  If dosing should be changed.   Otherwise no major changes in health status . Still takes supplements that she thinks help her. Allergies stable  Review of Systems ROS:  GEN/ HEENTNo fever,  headaches vision problems hearing changes, CV/ PULM; No chest pain shortness of breath cough, syncope,edema  change in exercise tolerance. GI /GU: No adominal pain, vomiting, change in bowel habits. No blood in the stool. No significant GU symptoms. SKIN/HEME: ,no acute skin rashes suspicious lesions or bleeding. No lymphadenopathy, nodules, masses.  NEURO/ PSYCH:  No neurologic signs such as weakness numbness No depression anxiety. IMM/ Allergy: No unusual infections.  Allergy .   Ok  REST of 12 system review negative  Past history family history social history reviewed in the electronic medical record.     Objective:   Physical Exam Physical Exam: Vital signs reviewed WUJ:WJXB is a well-developed well-nourished alert cooperative  white female who appears her stated age in no acute distress.  HEENT: normocephalic  traumatic , Eyes: PERRL EOM's full, conjunctiva clear, Nares: paten,t no deformity discharge or tenderness., Ears: no deformity EAC's clear TMs with normal landmarks. Mouth: clear OP, no lesions, edema.  Moist mucous membranes. Dentition in adequate repair. NECK: supple without masses, thyromegaly or bruits. CHEST/PULM:  Clear to auscultation and percussion breath sounds equal no wheeze , rales or rhonchi. No chest wall deformities or tenderness. Breast: normal by inspection . No dimpling, discharge, masses, tenderness or discharge . LN: no cervical axillary inguinal adenopathy CV:  PMI is nondisplaced, S1 S2 no gallops, murmurs, rubs. Peripheral pulses are full without delay.No JVD .  ABDOMEN: Bowel sounds normal nontender  No guard or rebound, no hepato splenomegal no CVA tenderness.  Pelvic: NL ext GU, labia clear without lesions or rash . Vagina no lesions .Cervix:  UTERUS: absent no masses .rectal no masses  Extremtities:  No clubbing cyanosis or edema, no acute joint swelling or redness no focal atrophy NEURO:  Oriented x3, cranial nerves 3-12 appear to be intact, no obvious focal weakness,gait within normal limits no abnormal reflexes or asymmetrical SKIN: No acute rashes normal turgor, color, no bruising or petechiae. PSYCH: Oriented, good eye contact, no obvious depression anxiety, cognition and judgment appear normal.     Assessment & Plan:  HRT  With break through hot flushes and sx .  Hx of endometriosis and ? Surgical menopause  Well women exam is normal UTD on PV parameters  Counseled.    Risk benefit of medication discussed.   Agree that increasing dosing is reasonable and not a sig increase in risk  Will follow .   Total visit > 50% spent counseling and coordinating care

## 2010-09-28 DIAGNOSIS — N959 Unspecified menopausal and perimenopausal disorder: Secondary | ICD-10-CM | POA: Insufficient documentation

## 2010-09-28 DIAGNOSIS — Z8742 Personal history of other diseases of the female genital tract: Secondary | ICD-10-CM | POA: Insufficient documentation

## 2010-09-28 DIAGNOSIS — N952 Postmenopausal atrophic vaginitis: Secondary | ICD-10-CM | POA: Insufficient documentation

## 2010-10-18 ENCOUNTER — Telehealth: Payer: Self-pay | Admitting: Internal Medicine

## 2010-10-18 NOTE — Telephone Encounter (Signed)
ERROR

## 2010-11-16 ENCOUNTER — Ambulatory Visit: Payer: Self-pay

## 2010-11-21 ENCOUNTER — Ambulatory Visit: Payer: Self-pay

## 2010-11-21 ENCOUNTER — Ambulatory Visit (INDEPENDENT_AMBULATORY_CARE_PROVIDER_SITE_OTHER): Payer: 59 | Admitting: *Deleted

## 2010-11-21 DIAGNOSIS — J309 Allergic rhinitis, unspecified: Secondary | ICD-10-CM

## 2010-11-21 MED ORDER — NON FORMULARY
0.2000 mg | Freq: Once | Status: AC
  Administered 2010-11-21: 0.2 mg via INTRAMUSCULAR

## 2010-11-24 ENCOUNTER — Ambulatory Visit: Payer: 59

## 2010-11-24 DIAGNOSIS — J309 Allergic rhinitis, unspecified: Secondary | ICD-10-CM

## 2010-11-24 MED ORDER — NON FORMULARY
0.2000 mL | Status: DC
  Administered 2010-12-09 – 2011-01-31 (×7): 0.2 mL via SUBCUTANEOUS
  Administered 2011-02-02: 0.3 mL via SUBCUTANEOUS

## 2010-11-24 MED ORDER — NON FORMULARY
0.2000 mL | Status: DC
  Administered 2010-12-09 – 2011-01-31 (×7): 0.2 mL via SUBCUTANEOUS
  Administered 2011-02-02: 1 mL via SUBCUTANEOUS

## 2010-11-29 ENCOUNTER — Ambulatory Visit (INDEPENDENT_AMBULATORY_CARE_PROVIDER_SITE_OTHER): Payer: 59 | Admitting: *Deleted

## 2010-11-29 DIAGNOSIS — J309 Allergic rhinitis, unspecified: Secondary | ICD-10-CM

## 2010-11-29 MED ORDER — NON FORMULARY
0.3000 mL | Freq: Once | Status: AC
  Administered 2010-11-29: 0.3 mL via INTRAMUSCULAR

## 2010-12-05 ENCOUNTER — Ambulatory Visit: Payer: 59 | Admitting: *Deleted

## 2010-12-09 ENCOUNTER — Ambulatory Visit: Payer: 59 | Admitting: *Deleted

## 2010-12-09 DIAGNOSIS — J309 Allergic rhinitis, unspecified: Secondary | ICD-10-CM

## 2010-12-16 ENCOUNTER — Ambulatory Visit (INDEPENDENT_AMBULATORY_CARE_PROVIDER_SITE_OTHER): Payer: 59 | Admitting: *Deleted

## 2010-12-16 DIAGNOSIS — J309 Allergic rhinitis, unspecified: Secondary | ICD-10-CM

## 2010-12-28 ENCOUNTER — Ambulatory Visit (INDEPENDENT_AMBULATORY_CARE_PROVIDER_SITE_OTHER): Payer: 59 | Admitting: *Deleted

## 2010-12-28 DIAGNOSIS — J309 Allergic rhinitis, unspecified: Secondary | ICD-10-CM

## 2010-12-30 ENCOUNTER — Ambulatory Visit (INDEPENDENT_AMBULATORY_CARE_PROVIDER_SITE_OTHER): Payer: 59

## 2010-12-30 DIAGNOSIS — J309 Allergic rhinitis, unspecified: Secondary | ICD-10-CM

## 2011-01-04 ENCOUNTER — Ambulatory Visit (INDEPENDENT_AMBULATORY_CARE_PROVIDER_SITE_OTHER): Payer: 59

## 2011-01-04 DIAGNOSIS — J309 Allergic rhinitis, unspecified: Secondary | ICD-10-CM

## 2011-01-06 ENCOUNTER — Ambulatory Visit: Payer: 59 | Admitting: *Deleted

## 2011-01-06 DIAGNOSIS — J309 Allergic rhinitis, unspecified: Secondary | ICD-10-CM

## 2011-01-06 MED ORDER — NON FORMULARY
0.2000 mL | Freq: Once | Status: AC
  Administered 2011-01-06: 0.2 mL via INTRAMUSCULAR

## 2011-01-10 ENCOUNTER — Ambulatory Visit (INDEPENDENT_AMBULATORY_CARE_PROVIDER_SITE_OTHER): Payer: 59 | Admitting: *Deleted

## 2011-01-10 DIAGNOSIS — J309 Allergic rhinitis, unspecified: Secondary | ICD-10-CM

## 2011-01-10 MED ORDER — NON FORMULARY
0.3000 mL | Freq: Once | Status: AC
  Administered 2011-01-10: 0.3 mL via INTRAMUSCULAR

## 2011-01-13 ENCOUNTER — Ambulatory Visit: Payer: 59

## 2011-01-17 ENCOUNTER — Ambulatory Visit: Payer: 59

## 2011-01-19 ENCOUNTER — Ambulatory Visit: Payer: 59 | Admitting: *Deleted

## 2011-01-19 DIAGNOSIS — J309 Allergic rhinitis, unspecified: Secondary | ICD-10-CM

## 2011-01-19 MED ORDER — NON FORMULARY: 0.3000 mL | Freq: Once | Status: DC

## 2011-01-27 ENCOUNTER — Telehealth: Payer: Self-pay | Admitting: Internal Medicine

## 2011-01-27 NOTE — Telephone Encounter (Signed)
Patient scheduled her screening mammogram. But now she is having a weird pain at the bottom of her left breast. No lump. Patient is requesting to have a diagnostic mammogram. Patient is requesting a return call from the nurse. Her screening mammogram is at [place]. Thanks.

## 2011-01-27 NOTE — Telephone Encounter (Signed)
This was in Debbie's box - was unanswered. Please read and advise.

## 2011-01-30 NOTE — Telephone Encounter (Signed)
Spoke to pt- around thanksgiving breast tenderness with elbow pain. Pain radiation up and down her left arm. No hot spots or dimpling. Just tenderness under her left breast. Pt was lifting weights. Now has a dullness under left arm.

## 2011-01-30 NOTE — Telephone Encounter (Signed)
I did not examine her for this problem . Would need office visit to decide on what path to take.  Can see her the week of x mas or can try to see her as a work in on [employer] day but she  May have to wait . Agree this could be MS cause and not breast.

## 2011-01-31 ENCOUNTER — Ambulatory Visit (INDEPENDENT_AMBULATORY_CARE_PROVIDER_SITE_OTHER): Payer: 59 | Admitting: *Deleted

## 2011-01-31 DIAGNOSIS — J309 Allergic rhinitis, unspecified: Secondary | ICD-10-CM

## 2011-01-31 NOTE — Telephone Encounter (Signed)
Pt aware that we are going to work her in at 9:40

## 2011-02-01 ENCOUNTER — Encounter: Payer: Self-pay | Admitting: Internal Medicine

## 2011-02-01 ENCOUNTER — Ambulatory Visit (INDEPENDENT_AMBULATORY_CARE_PROVIDER_SITE_OTHER): Payer: 59 | Admitting: Internal Medicine

## 2011-02-01 VITALS — BP 120/80 | HR 86 | Wt 142.0 lb

## 2011-02-01 DIAGNOSIS — M79602 Pain in left arm: Secondary | ICD-10-CM | POA: Insufficient documentation

## 2011-02-01 DIAGNOSIS — M79609 Pain in unspecified limb: Secondary | ICD-10-CM

## 2011-02-01 DIAGNOSIS — Q602 Renal agenesis, unspecified: Secondary | ICD-10-CM

## 2011-02-01 DIAGNOSIS — E8940 Asymptomatic postprocedural ovarian failure: Secondary | ICD-10-CM

## 2011-02-01 DIAGNOSIS — J309 Allergic rhinitis, unspecified: Secondary | ICD-10-CM

## 2011-02-01 DIAGNOSIS — Q605 Renal hypoplasia, unspecified: Secondary | ICD-10-CM

## 2011-02-01 DIAGNOSIS — Z905 Acquired absence of kidney: Secondary | ICD-10-CM | POA: Insufficient documentation

## 2011-02-01 DIAGNOSIS — N63 Unspecified lump in unspecified breast: Secondary | ICD-10-CM

## 2011-02-01 DIAGNOSIS — N644 Mastodynia: Secondary | ICD-10-CM

## 2011-02-01 NOTE — Progress Notes (Signed)
  Subjective:    Patient ID: Maria Kane, female    DOB: 24-Jan-1956, 55 y.o.   MRN: [id number]  HPI Patient comes in today for SDA  For acute problem evaluation. See phone notes. Onset at Thanksgiving of left breast pain that was not radiating it got some better she noticed some thickening in the area and then it came back more recently had breast pain to touch and radiating down the left arm. The radiation is now gone although she has had some pain in her left elbow. No injury but she is left handed there is no numbness or weakness at present and no unusual rashes. No history of breast biopsy. No significant neck pain.   Review of Systems ROS:  GEN/ HEENTNo fever, significant weight changes sweats headaches vision problems hearing changes, CV/ PULM; No chest pain shortness of breath cough, syncope,edema  change in exercise tolerance. GI /GU: No adominal pain, vomiting, change in bowel habits. No blood in the stool. No significant GU symptoms. SKIN/HEME: ,no acute skin rashes suspicious lesions or bleeding. No lymphadenopathy, nodules, masses.  NEURO/ PSYCH:  No neurologic signs such as weakness numbness No depression anxiety. IMM/ Allergy: No unusual infections.  Allergy .   Getting shots twice a week from Dr. Lyla Son direction however getting this at the [facility] site. This is because of distance of travel to her allergist office REST of 12 system review negative     Objective:   Physical Exam WDWN in nad  Neck: Supple without adenopathy or masses or bruits Chest:  Clear to A&P without wheezes rales or rhonchi CV:  S1-S2 no gallops or murmurs peripheral perfusion is normal BREast: left no dimpling or color change  Tender area lump vs  thickening 6 pm below areola axilla is clear and right breast is normal. Abdomen:  Sof,t normal bowel sounds without hepatosplenomegaly, no guarding rebound or masses no CVA tenderness LN: no cervical axillary inguinal adenopathy NEURO: oriented x 3  CN 3-12 appear intact. No focal muscle weakness  Good grip strengthor atrophy. DTRs symmetrical. Gait WNL.  Grossly non focal. No tremor or abnormal movement. Left arm looks normal possibly tenderness over the epicondyles no swelling noted Skin shows no unusual rash.      Assessment & Plan:  New onset left breast pain with thickening versus nodule Left arm pain although pain since resolved sounds more radicular versus musculoskeletal.  She has a solitary kidney so it is cautious with renal toxic meds  Doss options for pain she might take Tylenol we'll follow consider other evaluation if pain is returning in the meantime get diagnostic mammogram and ultrasound to characterize area of concern. Hormone replacement therapy for surgical menopause when younger  .  He is a perimenopausal age.  Allergies  directed by Dr. [place] Callas on getting desensitization shots at the [facility] site. This is done because of travel distance  from her home and is difficult for her to come here twice a week.

## 2011-02-01 NOTE — Assessment & Plan Note (Signed)
Feels more like a thickening that could be a lump center diagnostic mammogram and ultrasound.

## 2011-02-01 NOTE — Patient Instructions (Addendum)
Will arrange imaging  of breast.  Fu if  persistent or progressive arm pain etc .

## 2011-02-02 ENCOUNTER — Ambulatory Visit (INDEPENDENT_AMBULATORY_CARE_PROVIDER_SITE_OTHER): Payer: 59 | Admitting: *Deleted

## 2011-02-02 DIAGNOSIS — J309 Allergic rhinitis, unspecified: Secondary | ICD-10-CM

## 2011-02-03 ENCOUNTER — Telehealth: Payer: Self-pay | Admitting: *Deleted

## 2011-02-03 NOTE — Telephone Encounter (Signed)
Called pt to advise per nursing supervisor she would need to seek her injections from [facility] office going forward from this date 02-03-11, advised that pt could pick up her medication at her conveince, pt requested that we cancel all her appts for our office,I advised we will cancel all upcoming appts and that she will have to call [facility] to get her shots there from here on out.

## 2011-02-07 ENCOUNTER — Ambulatory Visit: Payer: 59

## 2011-02-09 ENCOUNTER — Ambulatory Visit: Payer: 59

## 2011-02-13 ENCOUNTER — Encounter: Payer: Self-pay | Admitting: Internal Medicine

## 2011-02-13 ENCOUNTER — Ambulatory Visit: Payer: 59

## 2011-02-15 ENCOUNTER — Ambulatory Visit: Payer: 59

## 2011-02-20 ENCOUNTER — Ambulatory Visit: Payer: 59

## 2011-02-23 ENCOUNTER — Ambulatory Visit: Payer: 59

## 2011-02-28 ENCOUNTER — Ambulatory Visit: Payer: 59

## 2011-03-02 ENCOUNTER — Telehealth: Payer: Self-pay | Admitting: Internal Medicine

## 2011-03-02 ENCOUNTER — Ambulatory Visit: Payer: 59

## 2011-03-02 NOTE — Telephone Encounter (Signed)
Pt has written Dr Fabian Sharp a letter concerning the reason she is requesting to switch PCP to Dr Beverely Low at [facility]. Pt is aware MD out of office until 03-13-2011.

## 2011-03-07 ENCOUNTER — Ambulatory Visit: Payer: 59

## 2011-03-09 ENCOUNTER — Ambulatory Visit: Payer: 59

## 2011-03-14 ENCOUNTER — Ambulatory Visit: Payer: 59

## 2011-03-16 ENCOUNTER — Telehealth: Payer: Self-pay | Admitting: Internal Medicine

## 2011-03-16 ENCOUNTER — Ambulatory Visit: Payer: 59

## 2011-03-16 NOTE — Telephone Encounter (Signed)
Patient is calling today, to check the status of her request to change from Dr. Fabian Sharp, to Dr. Beverely Low.  The original phone note was taken 03-02-11, but for some reason it was Closed instead of routed to Dr. Fabian Sharp for her approval.  Dr. Fabian Sharp, do you approve?

## 2011-03-16 NOTE — Telephone Encounter (Signed)
I approve of her request( though that was already done)  .I was out of the office for 6 days  .    Tell Patient that  I have sent her  letter of  concerns to our office manager to track  what occurred and see if any processes can be improved .

## 2011-03-20 NOTE — Telephone Encounter (Signed)
That's fine

## 2011-03-21 ENCOUNTER — Ambulatory Visit: Payer: 59

## 2011-03-23 ENCOUNTER — Ambulatory Visit: Payer: 59

## 2011-03-24 ENCOUNTER — Ambulatory Visit (INDEPENDENT_AMBULATORY_CARE_PROVIDER_SITE_OTHER): Payer: 59 | Admitting: Family Medicine

## 2011-03-24 ENCOUNTER — Encounter: Payer: Self-pay | Admitting: Family Medicine

## 2011-03-24 DIAGNOSIS — M25529 Pain in unspecified elbow: Secondary | ICD-10-CM

## 2011-03-24 DIAGNOSIS — M25569 Pain in unspecified knee: Secondary | ICD-10-CM | POA: Insufficient documentation

## 2011-03-24 MED ORDER — DICLOFENAC SODIUM 1 % TD GEL: 1.0000 "application " | Freq: Four times a day (QID) | TRANSDERMAL | Status: DC

## 2011-03-24 NOTE — Patient Instructions (Signed)
This appears to be lateral epicondylitis and knee arthritis Use the Voltaren gel for pain relief You can safely take tylenol ICE! Try and walk the difficult dog on the R side Call with any questions or concerns If no improvement in 2 weeks- call and we'll refer to ortho Hang in there!!!

## 2011-03-24 NOTE — Progress Notes (Signed)
  Subjective:    Patient ID: Maria Kane, female    DOB: [birth year], 56 y.o.   MRN: [id number]  HPI New.  Switching from Dr Fabian Sharp.  Arm pain- L elbow pain, 'excrutiating'.  sxs started a 'couple of months ago.  Tumeric powder will improve pain.  Will walk her large dog w/ leash in L hand.  L hand dominant.  Pain centers around lateral epicondyle.  No known injury.  Leg pain- L knee pain, longer than a 'couple of months'.  Nothing improves pain.  Avoids NSAIDs due to solitary kidney.  Knee pain is anterior.  No known injury   Review of Systems For ROS see HPI     Objective:   Physical Exam  Vitals reviewed. Constitutional: She appears well-developed and well-nourished. No distress.  Musculoskeletal: She exhibits no edema.       L arm: + TTP over L lateral epicondyle, pain w/ resisted pronation.  No edema.  Full flexion/extension suppination/pronation.  L knee: no obvious deformity or swelling.  + TTP along medial joint line.  Good flexion/extension w/ some crepitus.  (-) McMurray's            Assessment & Plan:

## 2011-04-04 NOTE — Assessment & Plan Note (Signed)
New.  Pain consistent w/ osteoarthritis.  Use Voltaren gel and tylenol for pain relief.  Talked about importance of remaining active.  If no relief in 2 weeks may require joint injxn.  Reviewed supportive care and red flags that should prompt return.  Pt expressed understanding and is in agreement w/ plan.

## 2011-04-04 NOTE — Assessment & Plan Note (Addendum)
New. Pt's pain consistent w/ lateral epicondylitis.  Due to solitary kidney will avoid systemic NSAIDs but will start Voltaren gel and tylenol prn.  Discussed importance of icing and limiting aggravating factors such as dog walking.  If no improvement in 2 weeks will need sports med appt.  Pt expressed understanding and is in agreement w/ plan.

## 2011-04-25 ENCOUNTER — Ambulatory Visit: Payer: 59 | Admitting: Family Medicine

## 2011-04-26 ENCOUNTER — Telehealth: Payer: Self-pay | Admitting: Family Medicine

## 2011-04-26 NOTE — Telephone Encounter (Signed)
Refill: Vivelle-Dot .025 mg patch. Apply one patch twice weekly as directed "need to schedule annual exam per md office" Qty 24. Last fill 12.7.12

## 2011-04-27 MED ORDER — ESTRADIOL 0.0375 MG/24HR TD PTTW: 1.0000 | MEDICATED_PATCH | TRANSDERMAL | Status: DC

## 2011-04-27 NOTE — Telephone Encounter (Signed)
rx sent to pharmacy by e-script  

## 2011-07-11 ENCOUNTER — Telehealth: Payer: Self-pay | Admitting: Family Medicine

## 2011-07-11 MED ORDER — ESTRADIOL 0.0375 MG/24HR TD PTTW: 1.0000 | MEDICATED_PATCH | TRANSDERMAL | Status: DC

## 2011-07-11 NOTE — Telephone Encounter (Signed)
rx sent to pharmacy by e-script  

## 2011-07-11 NOTE — Telephone Encounter (Signed)
Pt states she needs new rx for vivelle dot 0.025. She called the pharmacy and they told her to call us. [facility] pharmacy.

## 2011-07-21 ENCOUNTER — Other Ambulatory Visit: Payer: Self-pay | Admitting: *Deleted

## 2011-07-21 NOTE — Telephone Encounter (Signed)
Verbal order given to Pam to give pt 0.025mg /hr, left message to advise she needs to set up CPE asap Letter has been mailed to pt address noted in the chart to advise they are overdue for cpe/ov/labs and the pt needs to contact office to set up appt

## 2011-07-21 NOTE — Telephone Encounter (Signed)
We have been prescribing the 0.025 dose.  Should continue w/ this.  Needs to set up physical at her convenience

## 2011-07-21 NOTE — Telephone Encounter (Signed)
Please note prior note in chart

## 2011-07-21 NOTE — Telephone Encounter (Signed)
Received call from [facility] with [facility] outpatient pharmacy advising that the pt states that her retired MD changed her Vivelle Dot to 0.037mg /hr in march and that it was noted as 0.025mg /hr previously, pt called pharmacy concerning the medication being changed back to 0.025mg /hr per notes side effects from the 0.037mg /hr dosage, pt did not specify the side effects with the pharmacist, please advise if ok to change

## 2011-08-18 ENCOUNTER — Other Ambulatory Visit: Payer: Self-pay | Admitting: Family Medicine

## 2011-08-18 MED ORDER — ESTRADIOL 0.0375 MG/24HR TD PTTW: 1.0000 | MEDICATED_PATCH | TRANSDERMAL | Status: DC

## 2011-08-18 NOTE — Telephone Encounter (Signed)
rx sent to pharmacy by e-script Per upcoming CPE 10-17-11 and MD Beverely Low verbal instructions

## 2011-08-18 NOTE — Telephone Encounter (Signed)
refill vivelle-dot 0.025mg  patch #8, apply one patch to clean, dry skin twice weekly, last fill 5.31.13, last appt 2.4.13

## 2011-10-17 ENCOUNTER — Encounter: Payer: Self-pay | Admitting: Family Medicine

## 2011-10-17 ENCOUNTER — Ambulatory Visit (INDEPENDENT_AMBULATORY_CARE_PROVIDER_SITE_OTHER): Payer: 59 | Admitting: Family Medicine

## 2011-10-17 VITALS — BP 115/77 | HR 68 | Temp 97.8°F | Ht 64.5 in | Wt 140.2 lb

## 2011-10-17 DIAGNOSIS — M949 Disorder of cartilage, unspecified: Secondary | ICD-10-CM

## 2011-10-17 DIAGNOSIS — M899 Disorder of bone, unspecified: Secondary | ICD-10-CM

## 2011-10-17 DIAGNOSIS — Z0000 Encounter for general adult medical examination without abnormal findings: Secondary | ICD-10-CM | POA: Insufficient documentation

## 2011-10-17 DIAGNOSIS — E8940 Asymptomatic postprocedural ovarian failure: Secondary | ICD-10-CM

## 2011-10-17 DIAGNOSIS — M25529 Pain in unspecified elbow: Secondary | ICD-10-CM

## 2011-10-17 LAB — BASIC METABOLIC PANEL
BUN: 23 mg/dL (ref 6–23)
CO2: 25 mEq/L (ref 19–32)
Chloride: 105 mEq/L (ref 96–112)
Creatinine, Ser: 1 mg/dL (ref 0.4–1.2)
Glucose, Bld: 87 mg/dL (ref 70–99)

## 2011-10-17 LAB — HEPATIC FUNCTION PANEL
ALT: 20 U/L (ref 0–35)
Total Protein: 6.9 g/dL (ref 6.0–8.3)

## 2011-10-17 LAB — LIPID PANEL
Cholesterol: 175 mg/dL (ref 0–200)
LDL Cholesterol: 86 mg/dL (ref 0–99)

## 2011-10-17 LAB — CBC WITH DIFFERENTIAL/PLATELET
Basophils Relative: 0.7 % (ref 0.0–3.0)
Hemoglobin: 12.8 g/dL (ref 12.0–15.0)
Lymphocytes Relative: 35.2 % (ref 12.0–46.0)
Monocytes Relative: 5.6 % (ref 3.0–12.0)
Neutro Abs: 4 10*3/uL (ref 1.4–7.7)
RBC: 3.89 Mil/uL (ref 3.87–5.11)

## 2011-10-17 LAB — TSH: TSH: 1.94 u[IU]/mL (ref 0.35–5.50)

## 2011-10-17 MED ORDER — ESTRADIOL 0.025 MG/24HR TD PTTW: 1.0000 | MEDICATED_PATCH | TRANSDERMAL | Status: DC

## 2011-10-17 MED ORDER — DICLOFENAC SODIUM 1 % TD GEL: 2.0000 g | Freq: Four times a day (QID) | TRANSDERMAL | Status: DC

## 2011-10-17 NOTE — Patient Instructions (Addendum)
Follow up in 1 year or as needed We'll notify you of your lab results and make any changes if needed Consider the [facility] through [facility] for your husband Keep up the good work!  You look great! Call [facility] and re-establish w/ them Schedule your mammo and DEXA at the same time- the order is in [place]!!!

## 2011-10-17 NOTE — Assessment & Plan Note (Signed)
Check Vit D.  Order for DEXA entered.

## 2011-10-17 NOTE — Assessment & Plan Note (Signed)
Ongoing.  sxs consistent w/ lateral epicondylitis.  Ice, counter force strap, voltaren gel (due to solitary kidney and avoidance of systemic NSAIDs due to renal toxicity).  Reviewed supportive care and red flags that should prompt return.  Pt expressed understanding and is in agreement w/ plan.

## 2011-10-17 NOTE — Progress Notes (Signed)
  Subjective:    Patient ID: Maria Kane, female    DOB: 15-Jul-1955, 56 y.o.   MRN: [id number]  HPI CPE- UTD on mammo, overdue on DEXA.  UTD on colonoscopy.  HRT- would like to decrease dose to 0.025mg .  Would like to resume care w/ GYN [facility]).  R elbow pain- pt is L handed, stopped lifting weights last year due to pain.  Reports pain is intermittent and at times 'feels like i can barely lift my arm'.  Denies numbness, intermittent weakness due to pain.  Pain localized between mid forearm and just above elbow.  Has not tried OTC pain meds due to solitary kidney.  Pt does walk large dog and that side and 'he's a puller'.   Review of Systems Patient reports no vision/ hearing changes, adenopathy,fever, weight change,  persistant/recurrent hoarseness , swallowing issues, chest pain, palpitations, edema, persistant/recurrent cough, hemoptysis, dyspnea (rest/exertional/paroxysmal nocturnal), gastrointestinal bleeding (melena, rectal bleeding), abdominal pain, significant heartburn, bowel changes, GU symptoms (dysuria, hematuria, incontinence), Gyn symptoms (abnormal  bleeding, pain),  syncope, focal weakness, memory loss, numbness & tingling, skin/hair/nail changes, abnormal bruising or bleeding, anxiety, or depression.     Objective:   Physical Exam  General Appearance:    Alert, cooperative, no distress, appears stated age  Head:    Normocephalic, without obvious abnormality, atraumatic  Eyes:    PERRL, conjunctiva/corneas clear, EOM's intact, fundi    benign, both eyes  Ears:    Normal TM's and external ear canals, both ears  Nose:   Nares normal, septum midline, mucosa normal, no drainage    or sinus tenderness  Throat:   Lips, mucosa, and tongue normal; teeth and gums normal  Neck:   Supple, symmetrical, trachea midline, no adenopathy;    Thyroid: no enlargement/tenderness/nodules  Back:     Symmetric, no curvature, ROM normal, no CVA tenderness  Lungs:     Clear to  auscultation bilaterally, respirations unlabored  Chest Wall:    No tenderness or deformity   Heart:    Regular rate and rhythm, S1 and S2 normal, no murmur, rub   or gallop  Breast Exam:    No tenderness, masses, or nipple abnormality  Abdomen:     Soft, non-tender, bowel sounds active all four quadrants,    no masses, no organomegaly  Genitalia:    External genitalia normal, cervix normal in appearance, no CMT, uterus in normal size and position, adnexa w/out mass or tenderness, mucosa pink and moist, no lesions or discharge present  Rectal:    Normal external appearance  Extremities:   Extremities normal, atraumatic, no cyanosis or edema.  + TTP over R lateral epicondyle, pain w/ resisted pronation.  Pulses:   2+ and symmetric all extremities  Skin:   Skin color, texture, turgor normal, no rashes or lesions  Lymph nodes:   Cervical, supraclavicular, and axillary nodes normal  Neurologic:   CNII-XII intact, normal strength, sensation and reflexes    throughout          Assessment & Plan:

## 2011-10-17 NOTE — Assessment & Plan Note (Signed)
Pt's PE WNL.  UTD on mammo and colonoscopy.  Order for DEXA entered.  Check labs.  Anticipatory guidance provided.

## 2011-10-17 NOTE — Assessment & Plan Note (Signed)
Decrease Vivelle Dot to 0.025 patch and script sent.  Pt plans on re-establishing w/ GYN.

## 2011-10-18 ENCOUNTER — Encounter: Payer: Self-pay | Admitting: *Deleted

## 2011-10-19 LAB — VITAMIN D 1,25 DIHYDROXY
Vitamin D2 1, 25 (OH)2: <8 pg/mL
Vitamin D3 1, 25 (OH)2: 63 pg/mL

## 2011-10-20 ENCOUNTER — Encounter: Payer: Self-pay | Admitting: *Deleted

## 2011-10-31 ENCOUNTER — Inpatient Hospital Stay: Admission: RE | Admit: 2011-10-31 | Payer: 59 | Source: Ambulatory Visit

## 2011-11-03 ENCOUNTER — Other Ambulatory Visit: Payer: Self-pay | Admitting: *Deleted

## 2011-11-03 MED ORDER — ESTRADIOL 0.025 MG/24HR TD PTTW: 1.0000 | MEDICATED_PATCH | TRANSDERMAL | Status: DC

## 2011-11-03 NOTE — Telephone Encounter (Signed)
Pt usually gets 90 day supply, but only got 30 day supply at last fill.  90 day supply sent.

## 2012-02-05 ENCOUNTER — Telehealth: Payer: Self-pay | Admitting: Family Medicine

## 2012-02-05 DIAGNOSIS — Z1231 Encounter for screening mammogram for malignant neoplasm of breast: Secondary | ICD-10-CM

## 2012-02-05 NOTE — Telephone Encounter (Signed)
Bone density was ordered in August and pt has an open order for mammo in the chart from when she was seeing Dr Fabian Sharp

## 2012-02-05 NOTE — Telephone Encounter (Addendum)
Left message to call office. Orders have been placed.

## 2012-02-05 NOTE — Telephone Encounter (Signed)
Patient is scheduled for her mammogram and bone density scan tomorrow. She would like orders placed for these. Call mobile # when completed.

## 2012-02-08 ENCOUNTER — Encounter: Payer: Self-pay | Admitting: Internal Medicine

## 2012-02-08 ENCOUNTER — Ambulatory Visit: Payer: 59 | Admitting: Internal Medicine

## 2012-02-08 ENCOUNTER — Ambulatory Visit (INDEPENDENT_AMBULATORY_CARE_PROVIDER_SITE_OTHER): Payer: 59 | Admitting: Internal Medicine

## 2012-02-08 VITALS — BP 114/78 | HR 62 | Temp 97.0°F | Resp 18 | Ht 64.0 in | Wt 141.0 lb

## 2012-02-08 DIAGNOSIS — G4700 Insomnia, unspecified: Secondary | ICD-10-CM

## 2012-02-08 DIAGNOSIS — Z808 Family history of malignant neoplasm of other organs or systems: Secondary | ICD-10-CM

## 2012-02-08 DIAGNOSIS — Z803 Family history of malignant neoplasm of breast: Secondary | ICD-10-CM

## 2012-02-08 MED ORDER — ESTRADIOL 0.025 MG/24HR TD PTTW: 1.0000 | MEDICATED_PATCH | TRANSDERMAL | Status: DC

## 2012-02-08 MED ORDER — LORAZEPAM 0.5 MG PO TABS: ORAL_TABLET | ORAL | Status: DC

## 2012-02-08 NOTE — Patient Instructions (Addendum)
See me in 8 weeks  

## 2012-02-08 NOTE — Progress Notes (Signed)
Subjective:    Patient ID: Maria Kane, female    DOB: 1955/06/25, 56 y.o.   MRN: [id number]  HPI  New pt here for first visit to establish care.  Former care Dr. Beverely Low.  PMH allergic rhinitis on immunotherapy, osteopenia,  Peri-menopause.  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  She reports she has a solitary kidney S/P L nephrectomy due to UPJ reflux.  She is also S/P Hyst BSO due to enodometriosis.  FH pos for breast cancer in her mother at age 24.   She has been on very low dose vivelle dot .025 since her oopherectomy.  She does not report any other family members with breast cancer.  She has never had a BRCA test.   She is concerned over insomnia.  She has a very stressful job and has two teenagers at home.  She can fall asleep but wakes in the middle of the night and can't shut her mind down. She reports she is intolerant of ambien which she has tried in the past   No Known Allergies Past Medical History  Diagnosis Date  . Allergic rhinitis     dogs, dust, tree and ragweed  . Osteopenia     dexa 11/09 -1.4 spine, -1.2 hip  . Primiparous   . History of chickenpox   . Single kidney     left removed cong UPJ obstruction  . Pineal gland cyst     incidental finding   Past Surgical History  Procedure Date  . Appendectomy 2006  . Cholecystectomy 2001  . Abdominal hysterectomy 1996    age 75 total severe endometriosis  . Tonsillectomy 1965  . Left kidney 2002    removed congential UPJ obstruction    History   Social History  . Marital Status: Married    Spouse Name: N/A    Number of Children: N/A  . Years of Education: N/A   Occupational History  . Not on file.   Social History Main Topics  . Smoking status: Never Smoker   . Smokeless tobacco: Not on file  . Alcohol Use: 0.0 oz/week    2-3 Glasses of wine per week  . Drug Use: No  . Sexually Active: Yes    Birth Control/ Protection: Surgical   Other Topics Concern  . Not on file   Social History  Narrative   1/2 brother- schizophereniaOccupation: Ph.D- Diplomatic Services operational officer PsychologistMarriedHH of 4Regular exercise- yes2 adopted kids 2 dogsOriginally from [place]   Family History  Problem Relation Age of Onset  . Breast cancer Mother   . Osteoarthritis Mother   . Multiple sclerosis Mother   . Hypertension Father   . Lung cancer Father   . Stroke Father   . Heart disease Father     pacemaker  . Diabetes Sister   . Hypertension Sister   . Polymyositis Sister   . Diabetes Brother   . Hypertension Brother    Patient Active Problem List  Diagnosis  . HYPERPOTASSEMIA  . ALLERGIC RHINITIS  . PLANTAR FASCIITIS, LEFT  . Pain in Soft Tissues of Limb  . OSTEOPENIA  . CAVUS DEFORMITY OF FOOT, ACQUIRED  . NUMBNESS  . LIBIDO, DECREASED  . Allergic rhinitis  . Menopausal and perimenopausal disorder  . Premature surgical menopause on hormone replacement therapy  . Personal history of endometriosis  . Breast lump on left side at 6 o'clock position  . Breast pain in female  . Single kidney  . Left arm pain  .  Knee pain  . Elbow pain  . Physical exam, annual   Current Outpatient Prescriptions on File Prior to Visit  Medication Sig Dispense Refill  . aspirin 81 MG chewable tablet Chew 81 mg by mouth daily.        Marland Kitchen estradiol (VIVELLE-DOT) 0.025 MG/24HR Place 1 patch onto the skin 2 (two) times a week.  24 patch  0  . fish oil-omega-3 fatty acids 1000 MG capsule Take 2 g by mouth daily.        . Melatonin 3 MG CAPS Take by mouth.        . S-Adenosylmethionine (SAM-E PO) Take by mouth. 1600 mg daily        Current Facility-Administered Medications on File Prior to Visit  Medication Dose Route Frequency Provider Last Rate Last Dose  . NON FORMULARY 0.2 mL  0.2 mL Subcutaneous 2 times weekly Sheliah Hatch, MD   0.3 mL at 02/02/11 1515  . NON FORMULARY 0.2 mL  0.2 mL Subcutaneous 2 times weekly Sheliah Hatch, MD   1 mL at 02/02/11 1514  . NON FORMULARY 0.2 mL  0.2 mL Subcutaneous 2  times weekly Sheliah Hatch, MD   0.3 mL at 02/02/11 1504  . NON FORMULARY 0.3 mL  0.3 mL Injection Once Sheliah Hatch, MD      . NON FORMULARY 0.3 mL  0.3 mL Injection Once Sheliah Hatch, MD      . NON FORMULARY 0.3 mL  0.3 mL Injection Once Sheliah Hatch, MD          Review of Systems See HPI    Objective:   Physical Exam Physical Exam  Nursing note and vitals reviewed.  Constitutional: She is oriented to person, place, and time. She appears well-developed and well-nourished.  HENT:  Head: Normocephalic and atraumatic.  Cardiovascular: Normal rate and regular rhythm. Exam reveals no gallop and no friction rub.  No murmur heard.  Pulmonary/Chest: Breath sounds normal. She has no wheezes. She has no rales.  Neurological: She is alert and oriented to person, place, and time.  Skin: Skin is warm and dry.  Psychiatric: She has a normal mood and affect. Her behavior is normal.          Assessment & Plan:     Pt had to leave early to pick up her daughter.    INsomnia  OK to try low dose Ativan 0.5 mg 1/2 or one tablet 3 times a week prn .  Likely multifactorial.    FH of breast ca in mother.  Pt had mm at [facility] done 02/06/2012 which reveals no abnormal findings or changes from prior exams.  Long discussion about benefits of BRCA test.  She wishes to check with insurance before testing.  She is on a very low dose of Vivelle dot.  Will likely taper off estrogen within this year/.  See scanned report  HIstory  of enodmetriosis  Solitary kidney

## 2012-02-19 ENCOUNTER — Encounter: Payer: Self-pay | Admitting: *Deleted

## 2012-02-28 ENCOUNTER — Telehealth: Payer: Self-pay | Admitting: *Deleted

## 2012-02-28 NOTE — Telephone Encounter (Signed)
Maria Kane  This is Ok with me once pts are on there maintenance dose with no problems.  Call allergy clinic [facility] and have them tell you the dose and inquire how long has she been on maintenance dose??  Be sure pt knows to bring her Epi pen with her - she should have two pens - we can keep one here if she likes.

## 2012-02-28 NOTE — Telephone Encounter (Signed)
Pt wants [place] to administer allergy shots. I will be willing to do a Rn visit for this on days that pts are not scheduled. Pt states that [facility] would send [place] the medication.

## 2012-02-28 NOTE — Telephone Encounter (Signed)
Called Pt to see if procedure was completed. Pt states that she did get test done but is currently changing PCP so the results will go to them.

## 2012-03-03 ENCOUNTER — Encounter: Payer: Self-pay | Admitting: Internal Medicine

## 2012-03-03 ENCOUNTER — Telehealth: Payer: Self-pay | Admitting: Internal Medicine

## 2012-03-03 NOTE — Telephone Encounter (Signed)
Maria Kane   Call pt and let her know that her bone density tests shows osteopenia (just starting to thin).  She should take calcium 1200-1500 mg and vitamin D 800-10000 units daily.

## 2012-03-04 NOTE — Telephone Encounter (Signed)
Called and left message regarding allergy testing

## 2012-03-11 ENCOUNTER — Encounter: Payer: Self-pay | Admitting: *Deleted

## 2012-04-15 ENCOUNTER — Other Ambulatory Visit: Payer: Self-pay | Admitting: Internal Medicine

## 2012-04-15 NOTE — Telephone Encounter (Signed)
Will call in pending approval 

## 2012-04-16 NOTE — Telephone Encounter (Signed)
Ativan called in to [facility] pharmacy

## 2012-07-25 ENCOUNTER — Encounter: Payer: Self-pay | Admitting: Internal Medicine

## 2012-07-25 ENCOUNTER — Other Ambulatory Visit: Payer: Self-pay | Admitting: Internal Medicine

## 2012-07-25 ENCOUNTER — Ambulatory Visit (INDEPENDENT_AMBULATORY_CARE_PROVIDER_SITE_OTHER): Payer: 59 | Admitting: Internal Medicine

## 2012-07-25 VITALS — BP 108/69 | HR 72 | Temp 97.8°F | Resp 18 | Wt 143.0 lb

## 2012-07-25 DIAGNOSIS — K649 Unspecified hemorrhoids: Secondary | ICD-10-CM

## 2012-07-25 DIAGNOSIS — G4700 Insomnia, unspecified: Secondary | ICD-10-CM

## 2012-07-25 DIAGNOSIS — N644 Mastodynia: Secondary | ICD-10-CM

## 2012-07-25 MED ORDER — NYSTATIN 100000 UNIT/GM EX CREA: TOPICAL_CREAM | CUTANEOUS | Status: DC

## 2012-07-25 MED ORDER — HYDROCORTISONE ACETATE 25 MG RE SUPP: RECTAL | Status: DC

## 2012-07-25 NOTE — Patient Instructions (Addendum)
Call [facility] and schedule a diagnostic mammogram  Use prescription rectal suppository for 6 days then can buy OTC rectal suppository of choice

## 2012-07-25 NOTE — Progress Notes (Signed)
Subjective:    Patient ID: Maria Kane, female    DOB: [birth year], 57 y.o.   MRN: [id number]  HPI  Maria Kane is here for acute visit    She notes 1 week of left sided chest wall pain near her breast.    Describes pain as a "deep feeling"   No nipple discharge no mass felt.  Mother and cousin have breast cancer.   She has been on low dose Vivelle dot 0.025  Since her hyst and BSO when she was in her 30's  She has been lifting 30-40 lb weights when she exercises  She also has  Hemorrhoids that itch.  She thinks the suppositories she uses are expired No bleeding  Insomnia  Ativan helping   No Known Allergies Past Medical History  Diagnosis Date  . Allergic rhinitis     dogs, dust, tree and ragweed  . Osteopenia     dexa 11/09 -1.4 spine, -1.2 hip  . Primiparous   . History of chickenpox   . Single kidney     left removed cong UPJ obstruction  . Pineal gland cyst     incidental finding   Past Surgical History  Procedure Laterality Date  . Appendectomy  2006  . Cholecystectomy  2001  . Abdominal hysterectomy  1996    age 23 total severe endometriosis  . Tonsillectomy  1965  . Left kidney  2002    removed congential UPJ obstruction    History   Social History  . Marital Status: Married    Spouse Name: N/A    Number of Children: N/A  . Years of Education: N/A   Occupational History  . Not on file.   Social History Main Topics  . Smoking status: Never Smoker   . Smokeless tobacco: Not on file  . Alcohol Use: 0.0 oz/week    2-3 Glasses of wine per week  . Drug Use: No  . Sexually Active: Yes    Birth Control/ Protection: Surgical   Other Topics Concern  . Not on file   Social History Narrative   1/2 brother- schizopherenia   Occupation: Ph.D- Location manager   Married   HH of 4   Regular exercise- yes   2 adopted kids 2 dogs   Originally from [place]   Family History  Problem Relation Age of Onset  . Breast cancer Mother   . Osteoarthritis  Mother   . Multiple sclerosis Mother   . Hypertension Father   . Lung cancer Father   . Stroke Father   . Heart disease Father     pacemaker  . Diabetes Sister   . Hypertension Sister   . Polymyositis Sister   . Diabetes Brother   . Hypertension Brother    Patient Active Problem List   Diagnosis Date Noted  . Breast pain, left 07/25/2012  . Hemorrhoid 07/25/2012  . Insomnia 02/08/2012  . Family history of breast cancer in mother 02/08/2012  . Family history of melanoma 02/08/2012  . Physical exam, annual 10/17/2011  . Knee pain 03/24/2011  . Elbow pain 03/24/2011  . Breast lump on left side at 6 o'clock position 02/01/2011  . Breast pain in female 02/01/2011  . Left arm pain 02/01/2011  . Single kidney   . Menopausal and perimenopausal disorder 09/28/2010  . Premature surgical menopause on hormone replacement therapy 09/28/2010  . Personal history of endometriosis 09/28/2010  . Allergic rhinitis   . HYPERPOTASSEMIA 01/05/2010  . NUMBNESS 01/05/2010  .  LIBIDO, DECREASED 01/05/2010  . PLANTAR FASCIITIS, LEFT 03/19/2008  . CAVUS DEFORMITY OF FOOT, ACQUIRED 03/19/2008  . ALLERGIC RHINITIS 01/28/2008  . Pain in Soft Tissues of Limb 01/28/2008  . OSTEOPENIA 01/28/2008   Current Outpatient Prescriptions on File Prior to Visit  Medication Sig Dispense Refill  . estradiol (VIVELLE-DOT) 0.025 MG/24HR Place 1 patch onto the skin 2 (two) times a week.  24 patch  0  . LORazepam (ATIVAN) 0.5 MG tablet TAKE 1 TABLET BY MOUTH AT BEDTIME AS NEEDED  10 tablet  1  . Melatonin 3 MG CAPS Take by mouth.        . Multiple Vitamins-Minerals (MULTIVITAMIN WITH MINERALS) tablet Take 1 tablet by mouth daily.      . S-Adenosylmethionine (SAM-E PO) Take by mouth. 1600 mg daily        Current Facility-Administered Medications on File Prior to Visit  Medication Dose Route Frequency Provider Last Rate Last Dose  . NON FORMULARY 0.2 mL  0.2 mL Subcutaneous 2 times weekly Sheliah Hatch, MD   0.3  mL at 02/02/11 1515  . NON FORMULARY 0.2 mL  0.2 mL Subcutaneous 2 times weekly Sheliah Hatch, MD   1 mL at 02/02/11 1514  . NON FORMULARY 0.2 mL  0.2 mL Subcutaneous 2 times weekly Sheliah Hatch, MD   0.3 mL at 02/02/11 1504  . NON FORMULARY 0.3 mL  0.3 mL Injection Once Sheliah Hatch, MD      . NON FORMULARY 0.3 mL  0.3 mL Injection Once Sheliah Hatch, MD      . NON FORMULARY 0.3 mL  0.3 mL Injection Once Sheliah Hatch, MD          Review of Systems See HPI    Objective:   Physical Exam Physical Exam  Nursing note and vitals reviewed.  Constitutional: She is oriented to person, place, and time. She appears well-developed and well-nourished.  HENT:  Head: Normocephalic and atraumatic.  Cardiovascular: Normal rate and regular rhythm. Exam reveals no gallop and no friction rub.  No murmur heard.  Pulmonary/Chest: Breath sounds normal. She has no wheezes. She has no rales.   Breast  Careful exam of both breasts reveal no nipple discharge no discrete mass and no axillary adenopathy bilaterally Rectal  Small external hemorrhoidal tag  .  No mass guaiac neg Neurological: She is alert and oriented to person, place, and time.  Skin: Skin is warm and dry.  Psychiatric: She has a normal mood and affect. Her behavior is normal.             Assessment & Plan:  L breast pain  Will get [facility] MM [facility].  Pt wishes to schedule this herself.  May be due muscle strain of chest wall and can take advil for the next week.  ADvised to reduce arm weights by 50%  Hemmorhoids   Will RX Anusol suppositories and nystatin creme externally.  Insomnia continue occasional Ativan use  I spent 25 mins with this pt

## 2012-08-06 ENCOUNTER — Telehealth: Payer: Self-pay | Admitting: *Deleted

## 2012-08-06 NOTE — Telephone Encounter (Signed)
Spoke with pt and advised pt that due to her family history it is Dr Lonell Face recommendation that she have procedure. Explained that it was ultimately up to pt whether or not to have test done. Pt states that she will reschedule

## 2012-08-06 NOTE — Telephone Encounter (Signed)
Has a question about a mammogram with ultrasound. Patient says her breast pain went away 4 days ago; she wants to know if she still needs to have this test done. She prefers not to unless Dr Kathie Rhodes thinks it is necessary.

## 2012-08-07 ENCOUNTER — Telehealth: Payer: Self-pay | Admitting: *Deleted

## 2012-08-07 ENCOUNTER — Other Ambulatory Visit: Payer: Self-pay | Admitting: Internal Medicine

## 2012-08-07 NOTE — Telephone Encounter (Signed)
Maria Kane  Let pt and pharmacy know that she needs her diagnostic mammogram completed before I will refill her vivelle dot  I do not see where mm has been rescheduled

## 2012-08-07 NOTE — Telephone Encounter (Signed)
Pharmacy is requesting  3 month supply of vivelle

## 2012-08-20 ENCOUNTER — Other Ambulatory Visit: Payer: Self-pay | Admitting: Internal Medicine

## 2012-08-20 MED ORDER — ESTRADIOL 0.025 MG/24HR TD PTTW: 1.0000 | MEDICATED_PATCH | TRANSDERMAL | Status: DC

## 2012-08-20 NOTE — Telephone Encounter (Signed)
Spoke with this pt and she reports that she began exercising again and pain returned when she stopped pain left I again stressed the importance of dx mm and that Dr Constance Goltz is hesitant to give a rx for hormones until mm pt wanted me to left Dr Constance Goltz know about the exercise and her pain

## 2012-08-20 NOTE — Telephone Encounter (Signed)
See me in office for EKG  Will reorder one month supply

## 2012-08-21 ENCOUNTER — Telehealth: Payer: Self-pay | Admitting: *Deleted

## 2012-08-26 ENCOUNTER — Encounter: Payer: Self-pay | Admitting: Internal Medicine

## 2012-08-26 ENCOUNTER — Ambulatory Visit (INDEPENDENT_AMBULATORY_CARE_PROVIDER_SITE_OTHER): Payer: 59 | Admitting: Internal Medicine

## 2012-08-26 VITALS — BP 109/67 | HR 87 | Temp 97.4°F | Resp 18 | Wt 142.0 lb

## 2012-08-26 DIAGNOSIS — N644 Mastodynia: Secondary | ICD-10-CM

## 2012-08-26 DIAGNOSIS — J3489 Other specified disorders of nose and nasal sinuses: Secondary | ICD-10-CM

## 2012-08-26 NOTE — Patient Instructions (Addendum)
See me as needed  Keep CPE

## 2012-08-26 NOTE — Progress Notes (Signed)
Subjective:    Patient ID: Maria Kane Hidden, female    DOB: 12/27/1955, 57 y.o.   MRN: [id number]  HPI  Maria Kane is here for follow up.  She had L breast pain at my last visit in June and I scheduled a diagnostic mm at [facility] but she did not go stating she could not afford the test.  Pain went away when she stopped lifting weights.  She lifts anywhere from 20-40 lbs upper arm weights.    When she resumed weight lifting she had L sided breast pain again.  No radiation to left arm or jaw,  No SOB, No diaphoresis, no N/V  Pt has history of L nephrectomy secondary to UPJ stenosis with hydronephrosis.  She reports she had a small pimple on her nose that she squeezed and then placed a facial mask several times.  She has noticed the mask making this area worse and she had a healing ulcer on her nose  No Known Allergies Past Medical History  Diagnosis Date  . Allergic rhinitis     dogs, dust, tree and ragweed  . Osteopenia     dexa 11/09 -1.4 spine, -1.2 hip  . Primiparous   . History of chickenpox   . Single kidney     left removed cong UPJ obstruction  . Pineal gland cyst     incidental finding   Past Surgical History  Procedure Laterality Date  . Appendectomy  2006  . Cholecystectomy  2001  . Abdominal hysterectomy  1996    age 60 total severe endometriosis  . Tonsillectomy  1965  . Left kidney  2002    removed congential UPJ obstruction    History   Social History  . Marital Status: Married    Spouse Name: N/A    Number of Children: N/A  . Years of Education: N/A   Occupational History  . Not on file.   Social History Main Topics  . Smoking status: Never Smoker   . Smokeless tobacco: Not on file  . Alcohol Use: 0.0 oz/week    2-3 Glasses of wine per week  . Drug Use: No  . Sexually Active: Yes    Birth Control/ Protection: Surgical   Other Topics Concern  . Not on file   Social History Narrative   1/2 brother- schizopherenia   Occupation: Ph.D- Physicist, medical   Married   HH of 4   Regular exercise- yes   2 adopted kids 2 dogs   Originally from [place]   Family History  Problem Relation Age of Onset  . Breast cancer Mother   . Osteoarthritis Mother   . Multiple sclerosis Mother   . Hypertension Father   . Lung cancer Father   . Stroke Father   . Heart disease Father     pacemaker  . Diabetes Sister   . Hypertension Sister   . Polymyositis Sister   . Diabetes Brother   . Hypertension Brother    Patient Active Problem List   Diagnosis Date Noted  . Breast pain, left 07/25/2012  . Hemorrhoid 07/25/2012  . Insomnia 02/08/2012  . Family history of breast cancer in mother 02/08/2012  . Family history of melanoma 02/08/2012  . Physical exam, annual 10/17/2011  . Knee pain 03/24/2011  . Elbow pain 03/24/2011  . Breast lump on left side at 6 o'clock position 02/01/2011  . Breast pain in female 02/01/2011  . Left arm pain 02/01/2011  . Single kidney   . Menopausal  and perimenopausal disorder 09/28/2010  . Premature surgical menopause on hormone replacement therapy 09/28/2010  . Personal history of endometriosis 09/28/2010  . Allergic rhinitis   . HYPERPOTASSEMIA 01/05/2010  . NUMBNESS 01/05/2010  . LIBIDO, DECREASED 01/05/2010  . PLANTAR FASCIITIS, LEFT 03/19/2008  . CAVUS DEFORMITY OF FOOT, ACQUIRED 03/19/2008  . ALLERGIC RHINITIS 01/28/2008  . Pain in Soft Tissues of Limb 01/28/2008  . OSTEOPENIA 01/28/2008   Current Outpatient Prescriptions on File Prior to Visit  Medication Sig Dispense Refill  . estradiol (VIVELLE-DOT) 0.025 MG/24HR Place 1 patch onto the skin 2 (two) times a week.  8 patch  0  . hydrocortisone (ANUSOL-HC) 25 MG suppository Use one rectal suppository after each BM daily for 6 days  6 suppository  0  . LORazepam (ATIVAN) 0.5 MG tablet TAKE 1 TABLET BY MOUTH AT BEDTIME AS NEEDED  10 tablet  1  . Multiple Vitamins-Minerals (MULTIVITAMIN WITH MINERALS) tablet Take 1 tablet by mouth daily.      Marland Kitchen  nystatin cream (MYCOSTATIN) Apply externally to rectum after each BM once a day  30 g  0  . S-Adenosylmethionine (SAM-E PO) Take by mouth. 1600 mg daily       . Melatonin 3 MG CAPS Take by mouth.         Current Facility-Administered Medications on File Prior to Visit  Medication Dose Route Frequency Provider Last Rate Last Dose  . NON FORMULARY 0.2 mL  0.2 mL Subcutaneous 2 times weekly Sheliah Hatch, MD   0.3 mL at 02/02/11 1515  . NON FORMULARY 0.2 mL  0.2 mL Subcutaneous 2 times weekly Sheliah Hatch, MD   1 mL at 02/02/11 1514  . NON FORMULARY 0.2 mL  0.2 mL Subcutaneous 2 times weekly Sheliah Hatch, MD   0.3 mL at 02/02/11 1504  . NON FORMULARY 0.3 mL  0.3 mL Injection Once Sheliah Hatch, MD      . NON FORMULARY 0.3 mL  0.3 mL Injection Once Sheliah Hatch, MD      . NON FORMULARY 0.3 mL  0.3 mL Injection Once Sheliah Hatch, MD          Review of Systems See HPI    Objective:   Physical Exam Physical Exam  Nursing note and vitals reviewed.  Constitutional: She is oriented to person, place, and time. She appears well-developed and well-nourished.  HENT:  Head: Normocephalic and atraumatic. FAce she has a scaling dry ulerated area with scab formation on tip of nose .  No signs of cellulitis Cardiovascular: Normal rate and regular rhythm. Exam reveals no gallop and no friction rub.  No murmur heard.  Pulmonary/Chest: Breath sounds normal. She has no wheezes. She has no rales.  Neurological: She is alert and oriented to person, place, and time.  Skin: Skin is warm and dry.  Psychiatric: She has a normal mood and affect. Her behavior is normal.        Assessment & Plan:  L sided chest/breast pain:  EKG today  No acute changes SR  TWI limited ot V1.  Pain clinically likely to MS in nature.  ADvised to no lift greater that 20# upper body.    Nasal lesion  Healing.  Advised to not thoch with hands.  OTC antibiotic creme of choice.  See me if any  worsening

## 2012-09-03 ENCOUNTER — Telehealth: Payer: Self-pay | Admitting: *Deleted

## 2012-09-03 NOTE — Telephone Encounter (Signed)
LVM message regarding having allergy shots administered here in our office. Pt will need to have one more shot at clinic and then will be on maintenance dose. Pt also will need to sign a release form at their office in order for [place] to receive her records and paperwork

## 2012-10-08 NOTE — Telephone Encounter (Signed)
error 

## 2012-10-09 ENCOUNTER — Other Ambulatory Visit: Payer: Self-pay | Admitting: *Deleted

## 2012-10-09 MED ORDER — LORAZEPAM 0.5 MG PO TABS: ORAL_TABLET | ORAL | Status: DC

## 2012-10-09 NOTE — Telephone Encounter (Signed)
Will call in pending approval 

## 2012-12-16 ENCOUNTER — Encounter: Payer: Self-pay | Admitting: *Deleted

## 2012-12-20 ENCOUNTER — Ambulatory Visit (INDEPENDENT_AMBULATORY_CARE_PROVIDER_SITE_OTHER): Payer: 59 | Admitting: *Deleted

## 2012-12-20 DIAGNOSIS — Z23 Encounter for immunization: Secondary | ICD-10-CM

## 2012-12-20 NOTE — Progress Notes (Signed)
Patient ID: Maria Kane, female   DOB: [birth year], 57 y.o.   MRN: [id number] Maria Kane is here today for her annual flu vaccination. She is feeling well with no fevers. VIS given.

## 2012-12-30 ENCOUNTER — Telehealth: Payer: Self-pay | Admitting: *Deleted

## 2012-12-30 NOTE — Telephone Encounter (Signed)
Pt requested a sooner appt for daughter

## 2013-02-26 ENCOUNTER — Other Ambulatory Visit: Payer: Self-pay | Admitting: *Deleted

## 2013-02-26 MED ORDER — LORAZEPAM 0.5 MG PO TABS: ORAL_TABLET | ORAL | Status: DC

## 2013-02-26 NOTE — Telephone Encounter (Signed)
Refill request. Will call in pending approval 

## 2013-02-26 NOTE — Telephone Encounter (Signed)
Ok to phone in.

## 2013-02-27 NOTE — Telephone Encounter (Signed)
Ativan called in to [place]

## 2013-03-21 ENCOUNTER — Telehealth: Payer: Self-pay | Admitting: *Deleted

## 2013-03-26 ENCOUNTER — Ambulatory Visit: Payer: 59

## 2013-04-24 NOTE — Telephone Encounter (Signed)
Called to get daughters appt rescheduled

## 2013-06-11 ENCOUNTER — Other Ambulatory Visit: Payer: Self-pay | Admitting: Internal Medicine

## 2013-06-11 NOTE — Telephone Encounter (Signed)
Will call in pending approval

## 2013-06-11 NOTE — Telephone Encounter (Signed)
Xanax called in 

## 2013-09-11 ENCOUNTER — Encounter: Payer: Self-pay | Admitting: Internal Medicine

## 2013-09-11 ENCOUNTER — Ambulatory Visit (INDEPENDENT_AMBULATORY_CARE_PROVIDER_SITE_OTHER): Payer: 59 | Admitting: Internal Medicine

## 2013-09-11 VITALS — BP 130/71 | HR 66 | Temp 97.1°F | Resp 17 | Ht 64.0 in | Wt 154.0 lb

## 2013-09-11 DIAGNOSIS — R0789 Other chest pain: Secondary | ICD-10-CM

## 2013-09-11 DIAGNOSIS — R002 Palpitations: Secondary | ICD-10-CM

## 2013-09-11 DIAGNOSIS — Z139 Encounter for screening, unspecified: Secondary | ICD-10-CM

## 2013-09-11 NOTE — Patient Instructions (Signed)
Give pt number to Dr. Glynn Octave office    s

## 2013-09-15 NOTE — Progress Notes (Signed)
   Subjective:    Patient ID: Maria Kane, female    DOB: 1955/07/08, 58 y.o.   MRN: [id number]  HPI  Maria Kane is here for acute visit.  She notes exertional chest pressure  With palpitations when exercising outside.  Three weeks ago she felt palpitations and chest pressure when outside exercising and had to stop.  No radiation,  No N /V no diaphoresis  She tells me when she is exercising indoors she has no symptoms.  She has stopped exercising outdoors and has not had a recurrence of chest symptoms .  She is a non-smoker.  FH CVA in father when "older"  She does not wish me to examine her hemmorrhoid  No blood in stool or change in color of stool  She is leaving for [place] in a few days for several weeks with her adopted daughters .    Review of Systems See HPI    Objective:   Physical Exam Physical Exam  Nursing note and vitals reviewed.  Constitutional: She is oriented to person, place, and time. She appears well-developed and well-nourished.  HENT:  Head: Normocephalic and atraumatic.  Cardiovascular: Normal rate and regular rhythm. Exam reveals no gallop and no friction rub.  No murmur heard.  Pulmonary/Chest: Breath sounds normal. She has no wheezes. She has no rales.  Neurological: She is alert and oriented to person, place, and time.  Skin: Skin is warm and dry.  Psychiatric: She has a normal mood and affect. Her behavior is normal.         Assessment & Plan:  Chest pressure with palpitations  EKG today not acute changes /  Artefact present.    She has low risk factors for CAD but did have exertional symptoms.  I offered further work -up with stress testing but pt declines.   She is leaving for 2 weesk to [place].  Advised to see me if any return of symptoms.  She voices understanding  Hemmorrhoid

## 2013-10-07 ENCOUNTER — Telehealth: Payer: Self-pay | Admitting: *Deleted

## 2013-10-07 NOTE — Telephone Encounter (Signed)
Call Peck at 10/07/2013 1:58 PM     Status: Signed        Maria Kane would like a referral to a cardiologist to check out her heart.    Pt was in office on 7/23 complaining of chest pain w/ palpitations .Refused Stress test .

## 2013-10-07 NOTE — Telephone Encounter (Signed)
Request sent to Dr.

## 2013-10-07 NOTE — Telephone Encounter (Signed)
Maria Kane would like a referral to a cardiologist to check out her heart.

## 2013-10-08 ENCOUNTER — Telehealth: Payer: Self-pay | Admitting: Internal Medicine

## 2013-10-08 ENCOUNTER — Other Ambulatory Visit: Payer: Self-pay | Admitting: Internal Medicine

## 2013-10-08 DIAGNOSIS — R0789 Other chest pain: Secondary | ICD-10-CM

## 2013-10-08 NOTE — Telephone Encounter (Signed)
Left message on pts voicemail that if she is having any chest pain or pressure she is to go to ER for evaluation  Also left message that she should take baby asa 81 mg daily  Will se tup referral to cardiology first available appt

## 2013-10-12 ENCOUNTER — Emergency Department (HOSPITAL_BASED_OUTPATIENT_CLINIC_OR_DEPARTMENT_OTHER): Payer: 59

## 2013-10-12 ENCOUNTER — Encounter (HOSPITAL_BASED_OUTPATIENT_CLINIC_OR_DEPARTMENT_OTHER): Payer: Self-pay | Admitting: Emergency Medicine

## 2013-10-12 ENCOUNTER — Inpatient Hospital Stay (HOSPITAL_BASED_OUTPATIENT_CLINIC_OR_DEPARTMENT_OTHER)
Admission: EM | Admit: 2013-10-12 | Discharge: 2013-10-13 | DRG: 313 | Disposition: A | Discharge status: Home / Self Care | Payer: 59 | Attending: Cardiology | Admitting: Cardiology

## 2013-10-12 DIAGNOSIS — Z9089 Acquired absence of other organs: Secondary | ICD-10-CM

## 2013-10-12 DIAGNOSIS — M899 Disorder of bone, unspecified: Secondary | ICD-10-CM | POA: Diagnosis present

## 2013-10-12 DIAGNOSIS — R0602 Shortness of breath: Secondary | ICD-10-CM | POA: Diagnosis present

## 2013-10-12 DIAGNOSIS — Z905 Acquired absence of kidney: Secondary | ICD-10-CM

## 2013-10-12 DIAGNOSIS — R002 Palpitations: Secondary | ICD-10-CM | POA: Diagnosis present

## 2013-10-12 DIAGNOSIS — R079 Chest pain, unspecified: Secondary | ICD-10-CM | POA: Diagnosis not present

## 2013-10-12 DIAGNOSIS — Z90710 Acquired absence of both cervix and uterus: Secondary | ICD-10-CM | POA: Diagnosis not present

## 2013-10-12 DIAGNOSIS — R5381 Other malaise: Secondary | ICD-10-CM | POA: Diagnosis present

## 2013-10-12 DIAGNOSIS — I200 Unstable angina: Secondary | ICD-10-CM

## 2013-10-12 DIAGNOSIS — R5383 Other fatigue: Secondary | ICD-10-CM

## 2013-10-12 DIAGNOSIS — M949 Disorder of cartilage, unspecified: Secondary | ICD-10-CM

## 2013-10-12 HISTORY — DX: Palpitations: R00.2

## 2013-10-12 LAB — CBC
HCT: 41.7 % (ref 36.0–46.0)
Hemoglobin: 14 g/dL (ref 12.0–15.0)
MCH: 32.7 pg (ref 26.0–34.0)
MCHC: 33.6 g/dL (ref 30.0–36.0)
MCV: 97.4 fL (ref 78.0–100.0)
Platelets: 291 10*3/uL (ref 150–400)
RBC: 4.28 MIL/uL (ref 3.87–5.11)
RDW: 12.5 % (ref 11.5–15.5)
WBC: 8.1 10*3/uL (ref 4.0–10.5)

## 2013-10-12 LAB — BASIC METABOLIC PANEL
Anion gap: 16 — ABNORMAL HIGH (ref 5–15)
BUN: 20 mg/dL (ref 6–23)
CO2: 25 mEq/L (ref 19–32)
Calcium: 10.6 mg/dL — ABNORMAL HIGH (ref 8.4–10.5)
Chloride: 102 mEq/L (ref 96–112)
Creatinine, Ser: 0.9 mg/dL (ref 0.50–1.10)
GFR calc Af Amer: 81 mL/min — ABNORMAL LOW (ref >90)
GFR calc non Af Amer: 70 mL/min — ABNORMAL LOW (ref >90)
GLUCOSE: 94 mg/dL (ref 70–99)
POTASSIUM: 4.3 meq/L (ref 3.7–5.3)
Sodium: 143 mEq/L (ref 137–147)

## 2013-10-12 LAB — PRO B NATRIURETIC PEPTIDE: Pro B Natriuretic peptide (BNP): 59.6 pg/mL (ref 0–125)

## 2013-10-12 LAB — D-DIMER, QUANTITATIVE (NOT AT ARMC)

## 2013-10-12 LAB — PROTIME-INR
INR: 0.95 (ref 0.00–1.49)
Prothrombin Time: 12.7 seconds (ref 11.6–15.2)

## 2013-10-12 LAB — TROPONIN I: Troponin I: <0.3 ng/mL (ref <0.30)

## 2013-10-12 MED ORDER — SODIUM CHLORIDE 0.9 % IV SOLN
250.0000 mL | INTRAVENOUS | Status: DC | PRN
Start: 2013-10-12 — End: 2013-10-13

## 2013-10-12 MED ORDER — SODIUM CHLORIDE 0.9 % IV SOLN
Freq: Once | INTRAVENOUS | Status: AC
  Administered 2013-10-12: 17:00:00 via INTRAVENOUS

## 2013-10-12 MED ORDER — ASPIRIN 325 MG PO TABS
325.0000 mg | ORAL_TABLET | ORAL | Status: AC
  Administered 2013-10-12: 325 mg via ORAL
  Filled 2013-10-12: qty 1

## 2013-10-12 MED ORDER — METOPROLOL TARTRATE 1 MG/ML IV SOLN: 5.0000 mg | Freq: Once | INTRAVENOUS | Status: DC

## 2013-10-12 MED ORDER — HEPARIN BOLUS VIA INFUSION
4000.0000 [IU] | Freq: Once | INTRAVENOUS | Status: AC
  Administered 2013-10-12: 4000 [IU] via INTRAVENOUS

## 2013-10-12 MED ORDER — NITROGLYCERIN 2 % TD OINT
0.5000 [in_us] | TOPICAL_OINTMENT | Freq: Four times a day (QID) | TRANSDERMAL | Status: DC
Start: 2013-10-12 — End: 2013-10-12

## 2013-10-12 MED ORDER — ASPIRIN 81 MG PO CHEW: 324.0000 mg | CHEWABLE_TABLET | Freq: Once | ORAL | Status: DC

## 2013-10-12 MED ORDER — ASPIRIN 300 MG RE SUPP: 300.0000 mg | RECTAL | Status: DC

## 2013-10-12 MED ORDER — NITROGLYCERIN 2 % TD OINT: 0.5000 [in_us] | TOPICAL_OINTMENT | Freq: Four times a day (QID) | TRANSDERMAL | Status: DC

## 2013-10-12 MED ORDER — ACETAMINOPHEN 325 MG PO TABS
650.0000 mg | ORAL_TABLET | ORAL | Status: DC | PRN
Start: 2013-10-12 — End: 2013-10-13

## 2013-10-12 MED ORDER — ATORVASTATIN CALCIUM 80 MG PO TABS
80.0000 mg | ORAL_TABLET | Freq: Every day | ORAL | Status: DC
  Filled 2013-10-12: qty 1

## 2013-10-12 MED ORDER — HEPARIN (PORCINE) IN NACL 100-0.45 UNIT/ML-% IJ SOLN
900.0000 [IU]/h | INTRAMUSCULAR | Status: DC
  Administered 2013-10-12: 800 [IU]/h via INTRAVENOUS
  Filled 2013-10-12 (×2): qty 250

## 2013-10-12 MED ORDER — HEPARIN SODIUM (PORCINE) 5000 UNIT/ML IJ SOLN: 60.0000 [IU]/kg | Freq: Once | INTRAMUSCULAR | Status: DC

## 2013-10-12 MED ORDER — EXERCISE FOR HEART AND HEALTH BOOK
Freq: Once | Status: DC
  Filled 2013-10-12: qty 1

## 2013-10-12 MED ORDER — ONDANSETRON HCL 4 MG/2ML IJ SOLN: 4.0000 mg | Freq: Four times a day (QID) | INTRAMUSCULAR | Status: DC | PRN

## 2013-10-12 MED ORDER — ACTIVE PARTNERSHIP FOR HEALTH OF YOUR HEART BOOK
Freq: Once | Status: DC
  Filled 2013-10-12: qty 1

## 2013-10-12 MED ORDER — ASPIRIN 81 MG PO CHEW
81.0000 mg | CHEWABLE_TABLET | ORAL | Status: AC
  Administered 2013-10-13: 81 mg via ORAL
  Filled 2013-10-12: qty 1

## 2013-10-12 MED ORDER — ASPIRIN EC 81 MG PO TBEC
81.0000 mg | DELAYED_RELEASE_TABLET | Freq: Every day | ORAL | Status: DC
  Filled 2013-10-12: qty 1

## 2013-10-12 MED ORDER — SODIUM CHLORIDE 0.9 % IJ SOLN
3.0000 mL | INTRAMUSCULAR | Status: DC | PRN
Start: 2013-10-12 — End: 2013-10-13
  Administered 2013-10-13: 3 mL via INTRAVENOUS

## 2013-10-12 MED ORDER — SODIUM CHLORIDE 0.9 % IV SOLN: 1.0000 mL/kg/h | INTRAVENOUS | Status: DC

## 2013-10-12 MED ORDER — SODIUM CHLORIDE 0.9 % IJ SOLN
3.0000 mL | Freq: Two times a day (BID) | INTRAMUSCULAR | Status: DC
  Administered 2013-10-13: 3 mL via INTRAVENOUS

## 2013-10-12 MED ORDER — METOPROLOL TARTRATE 25 MG PO TABS
25.0000 mg | ORAL_TABLET | Freq: Two times a day (BID) | ORAL | Status: DC
  Filled 2013-10-12 (×3): qty 1

## 2013-10-12 MED ORDER — NITROGLYCERIN 0.4 MG SL SUBL: 0.4000 mg | SUBLINGUAL_TABLET | SUBLINGUAL | Status: DC | PRN

## 2013-10-12 MED ORDER — ASPIRIN 81 MG PO CHEW: 324.0000 mg | CHEWABLE_TABLET | ORAL | Status: DC

## 2013-10-12 NOTE — ED Notes (Signed)
Pt placed on heart monitor.

## 2013-10-12 NOTE — H&P (Addendum)
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   She describes chest pressure and exertional fatigue that has been intermittent over the last 2 months. She notices the discomfort when active, and it resolves with rest. It is associated with shortness of breath and says she gets very winded walking up hills and with other activity. She feels this has gotten more frequent.  She has also noticed periods of palpitations which are usually brief - just lasting seconds - she sometimes terminates these with coughing. These are not related to her CP. The symptoms have become worse over time prompting her doctor to arrange cardiology evaluation this week with Dr. Meda Coffee.   She presented to [facility] today after she awoke from sleep with tachypalpitations associated with CP. She is currently pain free and without any symptoms.   ECG and 1st troponin are normal.   She is s/p hysterectomy and previous gallbladder removal. She is on low-dose HRT.   Review of Systems:     Cardiac Review of Systems: {Y] = yes [ ]  = no  Chest Pain [  y  ]  Resting SOB [   ] Exertional SOB  Blue.Reese  ]  Orthopnea [  ]   Pedal Edema [   ]    Palpitations [  Y] Syncope  [  ]   Presyncope [   ]  General Review of Systems: [Y] = yes [  ]=no Constitional: recent weight change [  ]; anorexia [  ]; fatigue Blue.Reese ]; nausea [  ]; night sweats [  ]; fever [  ]; or chills [  ];                                                                                                                                          Dental: poor dentition[  ];   Eye : blurred vision [  ]; diplopia [   ]; vision changes [  ];  Amaurosis fugax[  ]; Resp: cough [  ];  wheezing[  ];  hemoptysis[  ]; shortness of breath[  ]; paroxysmal nocturnal dyspnea[  ]; dyspnea on exertion[ y ]; or orthopnea[  ];  GI:  gallstones[  ], vomiting[  ];  dysphagia[  ];  melena[  ];  hematochezia [  ]; heartburn[  ];   Hx of  Colonoscopy[  ]; GU: kidney stones [  ]; hematuria[  ];   dysuria [  ];  nocturia[  ];  history of     obstruction [  ];                 Skin: rash, swelling[  ];, hair loss[  ];  peripheral edema[  ];  or itching[  ]; Musculosketetal: myalgias[  ];  joint swelling[  ];  joint erythema[  ];  joint pain[  ];  back pain[  ];  Heme/Lymph: bruising[  ];  bleeding[  ];  anemia[  ];  Neuro: TIA[  ];  headaches[  ];  stroke[  ];  vertigo[  ];  seizures[  ];   paresthesias[  ];  difficulty walking[  ];  Psych:depression[  ]; anxiety[  ];  Endocrine: diabetes[  ];  thyroid dysfunction[  ];  Other:  Past Medical History  Diagnosis Date  . Allergic rhinitis     dogs, dust, tree and ragweed  . Osteopenia     dexa 11/09 -1.4 spine, -1.2 hip  . Primiparous   . History of chickenpox   . Single kidney     left removed cong UPJ obstruction  . Pineal gland cyst     incidental finding    Medications Prior to Admission  Medication Dose Route Frequency Provider Last Rate Last Dose  . NON FORMULARY 0.2 mL  0.2 mL Subcutaneous Once per day on Mon Thu Midge Minium, MD   0.3 mL at 02/02/11 1515  . NON FORMULARY 0.2 mL  0.2 mL Subcutaneous Once per day on Mon Thu Midge Minium, MD   1 mL at 02/02/11 1514  . NON FORMULARY 0.2 mL  0.2 mL Subcutaneous Once per day on Mon Thu Midge Minium, MD   0.3 mL at 02/02/11 1504  . NON FORMULARY 0.3 mL  0.3 mL Injection Once Midge Minium, MD      . NON FORMULARY 0.3 mL  0.3 mL Injection Once Midge Minium, MD      . NON FORMULARY 0.3 mL  0.3 mL Injection Once Midge Minium, MD       Medications Prior to Admission  Medication Sig Dispense Refill  . estradiol (VIVELLE-DOT) 0.025 MG/24HR Place 1 patch onto the skin 2 (two) times a week.  8 patch  0  . hydrocortisone (ANUSOL-HC) 25 MG suppository Use one rectal suppository after each BM daily for 6 days  6 suppository  0  . LORazepam  (ATIVAN) 0.5 MG tablet TAKE 1 TABLET BY MOUTH TWICE A WEEK FOR INSOMNIA  10 tablet  1  . Melatonin 3 MG CAPS Take by mouth.        . Multiple Vitamins-Minerals (MULTIVITAMIN WITH MINERALS) tablet Take 1 tablet by mouth daily.      Marland Kitchen nystatin cream (MYCOSTATIN) Apply externally to rectum after each BM once a day  30 g  0  . S-Adenosylmethionine (SAM-E PO) Take by mouth. 1600 mg daily          No Known Allergies  History   Social History  . Marital Status: Married    Spouse Name: N/A    Number of Children: N/A  . Years of Education: N/A   Occupational History  . Not on file.   Social History Main Topics  . Smoking status: Never Smoker   . Smokeless tobacco: Not on file  . Alcohol Use: 0.0 oz/week    2-3 Glasses of wine per week  . Drug Use: No  . Sexual Activity: Yes    Birth Control/ Protection: Surgical   Other Topics Concern  . Not on file   Social History Narrative   1/2 brother- schizopherenia   Occupation: Ph.D- Publishing rights manager   Married   [place] of 4   Regular exercise- yes   2 adopted kids 2 dogs   Originally from  [place]    Family History  Problem Relation Age of Onset  . Breast cancer Mother   . Osteoarthritis Mother   . Multiple sclerosis Mother   . Hypertension Father   . Lung cancer Father   . Stroke Father   . Heart disease Father     pacemaker  . Diabetes Sister   . Hypertension Sister   . Polymyositis Sister   . Diabetes Brother   . Hypertension Brother     PHYSICAL EXAM: Filed Vitals:   10/12/13 1653  BP: 131/69  Pulse: 59  Temp:   Resp: 20   General:  Well appearing. No respiratory difficulty HEENT: normal Neck: supple. no JVD. Carotids 2+ bilat; no bruits. No lymphadenopathy or thryomegaly appreciated. Cor: PMI nondisplaced. Regular rate & rhythm. No rubs, gallops or murmurs. Lungs: clear Abdomen: soft, nontender, nondistended. No hepatosplenomegaly. No bruits or masses. Good bowel sounds. Extremities: no cyanosis,  clubbing, rash, edema Neuro: alert & oriented x 3, cranial nerves grossly intact. moves all 4 extremities w/o difficulty. Affect pleasant.  ECG: SR .No ST-T wave abnormalities.    Results for orders placed during the hospital encounter of 10/12/13 (from the past 24 hour(s))  CBC     Status: None   Collection Time    10/12/13  2:30 PM      Result Value Ref Range   WBC 8.1  4.0 - 10.5 K/uL   RBC 4.28  3.87 - 5.11 MIL/uL   Hemoglobin 14.0  12.0 - 15.0 g/dL   HCT 41.7  36.0 - 46.0 %   MCV 97.4  78.0 - 100.0 fL   MCH 32.7  26.0 - 34.0 pg   MCHC 33.6  30.0 - 36.0 g/dL   RDW 12.5  11.5 - 15.5 %   Platelets 291  150 - 400 K/uL  PRO B NATRIURETIC PEPTIDE     Status: None   Collection Time    10/12/13  2:30 PM      Result Value Ref Range   Pro B Natriuretic peptide (BNP) 59.6  0 - 125 pg/mL  TROPONIN I     Status: None   Collection Time    10/12/13  2:30 PM      Result Value Ref Range   Troponin I <0.30  <0.30 ng/mL  BASIC METABOLIC PANEL     Status: Abnormal   Collection Time    10/12/13  2:30 PM      Result Value Ref Range   Sodium 143  137 - 147 mEq/L   Potassium 4.3  3.7 - 5.3 mEq/L   Chloride 102  96 - 112 mEq/L   CO2 25  19 - 32 mEq/L   Glucose, Bld 94  70 - 99 mg/dL   BUN 20  6 - 23 mg/dL   Creatinine, Ser 0.90  0.50 - 1.10 mg/dL   Calcium 10.6 (*) 8.4 - 10.5 mg/dL   GFR calc non Af Amer 70 (*) >90 mL/min   GFR calc Af Amer 81 (*) >90 mL/min   Anion gap 16 (*) 5 - 15  PROTIME-INR     Status: None   Collection Time    10/12/13  2:30 PM      Result Value Ref Range   Prothrombin Time 12.7  11.6 - 15.2 seconds   INR 0.95  0.00 - 1.49   Dg Chest 2 View  10/12/2013   CLINICAL DATA:  Irregular heartbeat and shortness of breath for 1 month. Chest pressure  EXAM: CHEST  2 VIEW  COMPARISON:  None.  FINDINGS: The heart size and mediastinal contours are within normal limits. Both lungs are clear. The visualized skeletal structures are unremarkable.  IMPRESSION: No active  cardiopulmonary disease.   Electronically Signed   By: Kerby Moors M.D.   On: 10/12/2013 13:44     ASSESSMENT: 1. Unstable angina  2. Single kidney 3. Palpitations  PLAN/DISCUSSION:  Despite lack of cardiac RFs, her exertional symptoms are concerning for unstable angina. However she also has episodic palpitations which may be SVT but these don't seem directly related to her exertional symptoms. Now pain free. ECG and CEs are normal. Will admit to tele. Place on heparin, ASA, NTG, b-blocker and statin. We discussed cath versus stress testing and have decided to proceed with cath. If cath negative, would d/c with event monitor.  Check d-dimer. Hold estrogen.   Benay Spice 7:21 PM

## 2013-10-12 NOTE — Progress Notes (Signed)
ANTICOAGULATION CONSULT NOTE - Initial Consult  Pharmacy Consult for Heparin Indication: chest pain/ACS  No Known Allergies  Patient Measurements: Height: 5\' 4"  (162.6 cm) Weight: 145 lb (65.772 kg) IBW/kg (Calculated) : 54.7 Heparin Dosing Weight: 65.7 kg  Vital Signs: Temp: 98.9 F (37.2 C) (08/23 1312) Temp src: Oral (08/23 1548) BP: 142/85 mmHg (08/23 1312) Pulse Rate: 82 (08/23 1312)  Labs:  Recent Labs  10/12/13 1430  HGB 14.0  HCT 41.7  PLT 291  CREATININE 0.90  TROPONINI <0.30    Estimated Creatinine Clearance: 65.1 ml/min (by C-G formula based on Cr of 0.9).   Medical History: Past Medical History  Diagnosis Date  . Allergic rhinitis     dogs, dust, tree and ragweed  . Osteopenia     dexa 11/09 -1.4 spine, -1.2 hip  . Primiparous   . History of chickenpox   . Single kidney     left removed cong UPJ obstruction  . Pineal gland cyst     incidental finding   Assessment: 68 YOF in [facility] ED with ACS to start IV heparin therapy. Patient was not on anticoagulants prior to admission. CBC within normal limits. Initial troponin negative. Patient has good renal function.   Goal of Therapy:  Heparin level 0.3-0.7 units/ml Monitor platelets by anticoagulation protocol: Yes   Plan:  1. Heparin bolus of 4000 units.  2. Heparin drip at 800 units/hr.  3. Heparin level in 6 hours.  4. Daily heparin level and CBC while on therapy.   Sloan Leiter, PharmD, BCPS Clinical Pharmacist [phone] 10/12/2013,4:03 PM

## 2013-10-12 NOTE — ED Notes (Signed)
Pt here for intermittent palpitations and chest tightness with activity as well as sob with activity.  Pt reports symptoms began a month ago.  She has seen her PCP and is scheduled to see cardiology on Thursday.  Pt states that she has felt like her heart was skipping beats (relieved by coughing) and also sob and chest tightness with exertion.  Pt reports that she has "no energy" and also feels anxiety.  She states that her symptoms have gotten worse over the past 2 days.  No CP at this time or palpitations.  Pt reports mild sob and anxiety

## 2013-10-12 NOTE — ED Provider Notes (Signed)
CSN: [id number]     Arrival date & time 10/12/13  1304 History   First MD Initiated Contact with Patient 10/12/13 1357     Chief Complaint  Patient presents with  . Chest Pain     (Consider location/radiation/quality/duration/timing/severity/associated sxs/prior Treatment) Patient is a 58 y.o. female presenting with chest pain. The history is provided by the patient.  Chest Pain Pain location:  Substernal area Pain quality: pressure   Associated symptoms: fatigue, palpitations and shortness of breath   Associated symptoms: no abdominal pain, no cough, no fever, no nausea and not vomiting   Associated symptoms comment:  She describes chest pressure that has been intermittent over the last 2 months. She notices the discomfort when active, and it resolves with rest. It is associated with shortness of breath and involvement of the left arm. She has also noticed periods of palpitations which feel like she is skipping beats. The symptoms have become worse over time prompting her doctor to arrange cardiology evaluation this week with Dr. Meda Coffee. She presents today because she had symptoms that woke her from sleep last night, which is the first time this has occurred. She is currently pain free and without any symptoms.    Past Medical History  Diagnosis Date  . Allergic rhinitis     dogs, dust, tree and ragweed  . Osteopenia     dexa 11/09 -1.4 spine, -1.2 hip  . Primiparous   . History of chickenpox   . Single kidney     left removed cong UPJ obstruction  . Pineal gland cyst     incidental finding   Past Surgical History  Procedure Laterality Date  . Appendectomy  2006  . Cholecystectomy  2001  . Abdominal hysterectomy  1996    age 63 total severe endometriosis  . Tonsillectomy  1965  . Left kidney  2002    removed congential UPJ obstruction    Family History  Problem Relation Age of Onset  . Breast cancer Mother   . Osteoarthritis Mother   . Multiple sclerosis Mother   .  Hypertension Father   . Lung cancer Father   . Stroke Father   . Heart disease Father     pacemaker  . Diabetes Sister   . Hypertension Sister   . Polymyositis Sister   . Diabetes Brother   . Hypertension Brother    History  Substance Use Topics  . Smoking status: Never Smoker   . Smokeless tobacco: Not on file  . Alcohol Use: 0.0 oz/week    2-3 Glasses of wine per week   OB History   Grav Para Term Preterm Abortions TAB SAB Ect Mult Living                 Review of Systems  Constitutional: Positive for fatigue. Negative for fever and chills.  HENT: Negative.   Eyes: Negative for visual disturbance.  Respiratory: Positive for shortness of breath. Negative for cough.   Cardiovascular: Positive for chest pain and palpitations.  Gastrointestinal: Negative.  Negative for nausea, vomiting and abdominal pain.  Musculoskeletal: Negative.   Skin: Negative.   Neurological: Negative.   Hematological: Does not bruise/bleed easily.  Psychiatric/Behavioral: Negative for confusion.      Allergies  Review of patient's allergies indicates no known allergies.  Home Medications   Prior to Admission medications   Medication Sig Start Date End Date Taking? Authorizing Provider  estradiol (VIVELLE-DOT) 0.025 MG/24HR Place 1 patch onto the skin 2 (two) times  a week. 08/20/12   Lanice Shirts, MD  hydrocortisone (ANUSOL-HC) 25 MG suppository Use one rectal suppository after each BM daily for 6 days 07/25/12   Lanice Shirts, MD  LORazepam (ATIVAN) 0.5 MG tablet TAKE 1 TABLET BY MOUTH TWICE A WEEK FOR INSOMNIA 06/11/13   Lanice Shirts, MD  Melatonin 3 MG CAPS Take by mouth.      Historical Provider, MD  Multiple Vitamins-Minerals (MULTIVITAMIN WITH MINERALS) tablet Take 1 tablet by mouth daily.    Historical Provider, MD  nystatin cream (MYCOSTATIN) Apply externally to rectum after each BM once a day 07/25/12   Lanice Shirts, MD  S-Adenosylmethionine (SAM-E PO) Take by  mouth. 1600 mg daily     Historical Provider, MD   BP 131/69  Pulse 68  Temp(Src) 98.9 F (37.2 C) (Oral)  Resp 18  Ht 5\' 4"  (1.626 m)  Wt 145 lb (65.772 kg)  BMI 24.88 kg/m2  SpO2 100% Physical Exam  Constitutional: She is oriented to person, place, and time. She appears well-developed and well-nourished.  HENT:  Head: Normocephalic.  Neck: Normal range of motion. Neck supple.  Cardiovascular: Normal rate and regular rhythm.   No murmur heard. Pulmonary/Chest: Effort normal and breath sounds normal. She has no wheezes. She has no rales. She exhibits no tenderness.  Abdominal: Soft. Bowel sounds are normal. There is no tenderness. There is no rebound and no guarding.  Musculoskeletal: Normal range of motion. She exhibits no edema.  Neurological: She is alert and oriented to person, place, and time.  Skin: Skin is warm and dry. No rash noted.  Psychiatric: She has a normal mood and affect.    ED Course  Procedures (including critical care time) Labs Review Labs Reviewed  BASIC METABOLIC PANEL - Abnormal; Notable for the following:    Calcium 10.6 (*)    GFR calc non Af Amer 70 (*)    GFR calc Af Amer 81 (*)    Anion gap 16 (*)    All other components within normal limits  CBC  PRO B NATRIURETIC PEPTIDE  TROPONIN I   Results for orders placed during the hospital encounter of 10/12/13  CBC      Result Value Ref Range   WBC 8.1  4.0 - 10.5 K/uL   RBC 4.28  3.87 - 5.11 MIL/uL   Hemoglobin 14.0  12.0 - 15.0 g/dL   HCT 41.7  36.0 - 46.0 %   MCV 97.4  78.0 - 100.0 fL   MCH 32.7  26.0 - 34.0 pg   MCHC 33.6  30.0 - 36.0 g/dL   RDW 12.5  11.5 - 15.5 %   Platelets 291  150 - 400 K/uL  PRO B NATRIURETIC PEPTIDE      Result Value Ref Range   Pro B Natriuretic peptide (BNP) 59.6  0 - 125 pg/mL  TROPONIN I      Result Value Ref Range   Troponin I <0.30  <0.30 ng/mL  BASIC METABOLIC PANEL      Result Value Ref Range   Sodium 143  137 - 147 mEq/L   Potassium 4.3  3.7 - 5.3  mEq/L   Chloride 102  96 - 112 mEq/L   CO2 25  19 - 32 mEq/L   Glucose, Bld 94  70 - 99 mg/dL   BUN 20  6 - 23 mg/dL   Creatinine, Ser 0.90  0.50 - 1.10 mg/dL   Calcium 10.6 (*) 8.4 - 10.5  mg/dL   GFR calc non Af Amer 70 (*) >90 mL/min   GFR calc Af Amer 81 (*) >90 mL/min   Anion gap 16 (*) 5 - 15     Imaging Review Dg Chest 2 View  10/12/2013   CLINICAL DATA:  Irregular heartbeat and shortness of breath for 1 month. Chest pressure  EXAM: CHEST  2 VIEW  COMPARISON:  None.  FINDINGS: The heart size and mediastinal contours are within normal limits. Both lungs are clear. The visualized skeletal structures are unremarkable.  IMPRESSION: No active cardiopulmonary disease.   Electronically Signed   By: Kerby Moors M.D.   On: 10/12/2013 13:44     EKG Interpretation   Date/Time:  Sunday October 12 2013 13:18:18 EDT Ventricular Rate:  89 PR Interval:  134 QRS Duration: 86 QT Interval:  384 QTC Calculation: 467 R Axis:   73 Text Interpretation:  Normal sinus rhythm Normal ECG No significant change  since last tracing Confirmed by GOLDSTON  MD, SCOTT ([id number]) on 10/12/2013  1:48:20 PM      MDM   Final diagnoses:  Chest pain, unspecified chest pain type    She remains pain free for duration of ED visit. VSS. Neg EKG for acute changes and troponin is normal. Symptoms are concerning for ischemia and this was discussed with Dr. Sung Amabile of cardiology who accepts for admission to [facility]. Patient is on the monitor, heparin/nitro/lopressor ordered per Dr. Sung Amabile. Dr. Regenia Skeeter involved in patient care.    Dewaine Oats, PA-C 10/12/13 1617

## 2013-10-13 ENCOUNTER — Inpatient Hospital Stay (HOSPITAL_COMMUNITY): Payer: 59

## 2013-10-13 ENCOUNTER — Encounter (HOSPITAL_COMMUNITY): Payer: Self-pay | Admitting: Cardiology

## 2013-10-13 ENCOUNTER — Other Ambulatory Visit: Payer: Self-pay | Admitting: Physician Assistant

## 2013-10-13 ENCOUNTER — Encounter (HOSPITAL_COMMUNITY)
Admission: EM | Disposition: A | Discharge status: Home / Self Care | Payer: Self-pay | Source: Home / Self Care | Attending: Cardiology

## 2013-10-13 DIAGNOSIS — R002 Palpitations: Secondary | ICD-10-CM

## 2013-10-13 DIAGNOSIS — R079 Chest pain, unspecified: Principal | ICD-10-CM

## 2013-10-13 HISTORY — DX: Palpitations: R00.2

## 2013-10-13 LAB — LIPID PANEL
Cholesterol: 212 mg/dL — ABNORMAL HIGH (ref 0–200)
HDL: 62 mg/dL (ref >39)
LDL CALC: 107 mg/dL — AB (ref 0–99)
Total CHOL/HDL Ratio: 3.4 RATIO
Triglycerides: 214 mg/dL — ABNORMAL HIGH (ref <150)
VLDL: 43 mg/dL — ABNORMAL HIGH (ref 0–40)

## 2013-10-13 LAB — TROPONIN I: Troponin I: <0.3 ng/mL (ref <0.30)

## 2013-10-13 LAB — HEPARIN LEVEL (UNFRACTIONATED): Heparin Unfractionated: 0.25 IU/mL — ABNORMAL LOW (ref 0.30–0.70)

## 2013-10-13 SURGERY — LEFT HEART CATHETERIZATION WITH CORONARY ANGIOGRAM
Anesthesia: LOCAL

## 2013-10-13 MED ORDER — METOPROLOL TARTRATE 25 MG PO TABS: 25.0000 mg | ORAL_TABLET | Freq: Two times a day (BID) | ORAL | Status: DC

## 2013-10-13 MED ORDER — ASPIRIN 81 MG PO TBEC: 81.0000 mg | DELAYED_RELEASE_TABLET | Freq: Every day | ORAL | Status: DC

## 2013-10-13 MED ORDER — TECHNETIUM TC 99M SESTAMIBI - CARDIOLITE
30.0000 | Freq: Once | INTRAVENOUS | Status: AC | PRN
  Administered 2013-10-13: 30 via INTRAVENOUS

## 2013-10-13 MED ORDER — ASPIRIN 81 MG PO CHEW
CHEWABLE_TABLET | ORAL | Status: AC
  Filled 2013-10-13: qty 1

## 2013-10-13 MED ORDER — TECHNETIUM TC 99M SESTAMIBI - CARDIOLITE
10.0000 | Freq: Once | INTRAVENOUS | Status: AC | PRN
  Administered 2013-10-13: 11:00:00 10 via INTRAVENOUS

## 2013-10-13 NOTE — Discharge Instructions (Signed)
Angina Pectoris Angina pectoris is extreme discomfort in your chest, neck, or arm. Your doctor may call it just angina. It is caused by a lack of oxygen to your heart wall. It may feel like tightness or heavy pressure. It may feel like a crushing or squeezing pain. Some people say it feels like gas. It may go down your shoulders, back, and arms. Some people have symptoms other than pain. These include:  Tiredness.  Shortness of breath.  Cold sweats.  Feeling sick to your stomach (nausea). There are four types of angina:  Stable angina. This type often lasts the same amount of time each time it happens. Activity, stress, or excitement can bring it on. It often gets better after taking a medicine called nitroglycerin. This goes under your tongue.  Unstable angina. This type can happen when you are not active or even during sleep. It can suddenly get worse or happen more often. It may not get better after taking the special medicine. It can last up to 30 minutes.  Microvascular angina. This type is more common in women. It may be more severe or last longer than other types.  Prinzmetal angina. This type often happens when you are not active or in the early morning hours. HOME CARE   Only take medicines as told by your doctor.  Stay active or exercise more as told by your doctor.  Limit very hard activity as told by your doctor.  Limit heavy lifting as told by your doctor.  Keep a healthy weight.  Learn about and eat foods that are healthy for your heart.  Do not use any tobacco such as cigarettes, chewing tobacco, or e-cigarettes. GET HELP RIGHT AWAY IF:   You have chest, neck, deep shoulder, or arm pain or discomfort that lasts more than a few minutes.  You have chest, neck, deep shoulder, or arm pain or discomfort that goes away and comes back over and over again.  You have heavy sweating that seems to happen for no reason.  You have shortness of breath or trouble  breathing.  Your angina does not get better after a few minutes of rest.  Your angina does not get better after you take nitroglycerin medicine. These can all be symptoms of a heart attack. Get help right away. Call your local emergency service (911 in [place] Do not  drive yourself to the hospital. Do not  wait to for your symptoms to go away. MAKE SURE YOU:   Understand these instructions.  Will watch your condition.  Will get help right away if you are not doing well or get worse. Document Released: 07/26/2007 Document Revised: 02/11/2013 Document Reviewed: 06/10/2013 [facility] Patient Information 2015 [place], [place]. This information is not intended to replace advice given to you by your health care provider. Make sure you discuss any questions you have with your health care provider.  Chest Pain (Nonspecific) It is often hard to give a diagnosis for the cause of chest pain. There is always a chance that your pain could be related to something serious, such as a heart attack or a blood clot in the lungs. You need to follow up with your doctor. HOME CARE  If antibiotic medicine was given, take it as directed by your doctor. Finish the medicine even if you start to feel better.  For the next few days, avoid activities that bring on chest pain. Continue physical activities as told by your doctor.  Do not use any tobacco products.  This includes cigarettes, chewing tobacco, and e-cigarettes.  Avoid drinking alcohol.  Only take medicine as told by your doctor.  Follow your doctor's suggestions for more testing if your chest pain does not go away.  Keep all doctor visits you made. GET HELP IF:  Your chest pain does not go away, even after treatment.  You have a rash with blisters on your chest.  You have a fever. GET HELP RIGHT AWAY IF:   You have more pain or pain that spreads to your arm, neck, jaw, back, or belly (abdomen).  You have shortness of breath.  You cough more  than usual or cough up blood.  You have very bad back or belly pain.  You feel sick to your stomach (nauseous) or throw up (vomit).  You have very bad weakness.  You pass out (faint).  You have chills. This is an emergency. Do not wait to see if the problems will go away. Call your local emergency services (911 in [place] Do not drive yourself to the hospital. MAKE SURE YOU:   Understand these instructions.  Will watch your condition.  Will get help right away if you are not doing well or get worse. Document Released: 07/26/2007 Document Revised: 02/11/2013 Document Reviewed: 07/26/2007 [facility] Patient Information 2015 [place], [place]. This information is not intended to replace advice given to you by your health care provider. Make sure you discuss any questions you have with your health care provider.    Cardiac Diet This diet can help prevent heart disease and stroke. Many factors influence your heart health, including eating and exercise habits. Coronary risk rises a lot with abnormal blood fat (lipid) levels. Cardiac meal planning includes limiting unhealthy fats, increasing healthy fats, and making other small dietary changes. General guidelines are as follows:  Adjust calorie intake to reach and maintain desirable body weight.  Limit total fat intake to less than 30% of total calories. Saturated fat should be less than 7% of calories.  Saturated fats are found in animal products and in some vegetable products. Saturated vegetable fats are found in coconut oil, cocoa butter, palm oil, and palm kernel oil. Read labels carefully to avoid these products as much as possible. Use butter in moderation. Choose tub margarines and oils that have 2 grams of fat or less. Good cooking oils are canola and olive oils.  Practice low-fat cooking techniques. Do not fry food. Instead, broil, bake, boil, steam, grill, roast on a rack, stir-fry, or microwave it. Other fat reducing suggestions  include:  Remove the skin from poultry.  Remove all visible fat from meats.  Skim the fat off stews, soups, and gravies before serving them.  Steam vegetables in water or broth instead of sauting them in fat.  Avoid foods with trans fat (or hydrogenated oils), such as commercially fried foods and commercially baked goods. Commercial shortening and deep-frying fats will contain trans fat.  Increase intake of fruits, vegetables, whole grains, and legumes to replace foods high in fat.  Increase consumption of nuts, legumes, and seeds to at least 4 servings weekly. One serving of a legume equals  cup, and 1 serving of nuts or seeds equals  cup.  Choose whole grains more often. Have 3 servings per day (a serving is 1 ounce [oz]).  Eat 4 to 5 servings of vegetables per day. A serving of vegetables is 1 cup of raw leafy vegetables;  cup of raw or cooked cut-up vegetables;  cup of vegetable juice.  Eat  4 to 5 servings of fruit per day. A serving of fruit is 1 medium whole fruit;  cup of dried fruit;  cup of fresh, frozen, or canned fruit;  cup of 100% fruit juice.  Increase your intake of dietary fiber to 20 to 30 grams per day. Insoluble fiber may help lower your risk of heart disease and may help curb your appetite.  Soluble fiber binds cholesterol to be removed from the blood. Foods high in soluble fiber are dried beans, citrus fruits, oats, apples, bananas, broccoli, Brussels sprouts, and eggplant.  Try to include foods fortified with plant sterols or stanols, such as yogurt, breads, juices, or margarines. Choose several fortified foods to achieve a daily intake of 2 to 3 grams of plant sterols or stanols.  Foods with omega-3 fats can help reduce your risk of heart disease. Aim to have a 3.5 oz portion of fatty fish twice per week, such as salmon, mackerel, albacore tuna, sardines, lake trout, or herring. If you wish to take a fish oil supplement, choose one that contains 1 gram of  both DHA and EPA.  Limit processed meats to 2 servings (3 oz portion) weekly.  Limit the sodium in your diet to 1500 milligrams (mg) per day. If you have high blood pressure, talk to a registered dietitian about a DASH (Dietary Approaches to Stop Hypertension) eating plan.  Limit sweets and beverages with added sugar, such as soda, to no more than 5 servings per week. One serving is:   1 tablespoon sugar.  1 tablespoon jelly or jam.   cup sorbet.  1 cup lemonade.   cup regular soda. CHOOSING FOODS Starches  Allowed: Breads: All kinds (wheat, rye, raisin, white, oatmeal, [place], [place], and English muffin bread). Low-fat rolls: English muffins, frankfurter and hamburger buns, bagels, pita bread, tortillas (not fried). Pancakes, waffles, biscuits, and muffins made with recommended oil.  Avoid: Products made with saturated or trans fats, oils, or whole milk products. Butter rolls, cheese breads, croissants. Commercial doughnuts, muffins, sweet rolls, biscuits, waffles, pancakes, store-bought mixes. Crackers  Allowed: Low-fat crackers and snacks: Animal, graham, rye, saltine (with recommended oil, no lard), oyster, and matzo crackers. Bread sticks, melba toast, rusks, flatbread, pretzels, and light popcorn.  Avoid: High-fat crackers: cheese crackers, butter crackers, and those made with coconut, palm oil, or trans fat (hydrogenated oils). Buttered popcorn. Cereals  Allowed: Hot or cold whole-grain cereals.  Avoid: Cereals containing coconut, hydrogenated vegetable fat, or animal fat. Potatoes / Pasta / Rice  Allowed: All kinds of potatoes, rice, and pasta (such as macaroni, spaghetti, and noodles).  Avoid: Pasta or rice prepared with cream sauce or high-fat cheese. Chow mein noodles, [place] fries. Vegetables  Allowed: All vegetables and vegetable juices.  Avoid: Fried vegetables. Vegetables in cream, butter, or high-fat cheese sauces. Limit coconut. Fruit in cream or  custard. Protein  Allowed: Limit your intake of meat, seafood, and poultry to no more than 6 oz (cooked weight) per day. All lean, well-trimmed beef, veal, pork, and lamb. All chicken and [place] without skin. All fish and shellfish. Wild game: wild duck, rabbit, pheasant, and venison. Egg whites or low-cholesterol egg substitutes may be used as desired. Meatless dishes: recipes with dried beans, peas, lentils, and tofu (soybean curd). Seeds and nuts: all seeds and most nuts.  Avoid: Prime grade and other heavily marbled and fatty meats, such as short ribs, spare ribs, rib eye roast or steak, frankfurters, sausage, bacon, and high-fat luncheon meats, mutton. Caviar. Commercially fried fish.  Domestic duck, goose, venison sausage. Organ meats: liver, gizzard, heart, chitterlings, brains, kidney, sweetbreads. Dairy  Allowed: Low-fat cheeses: nonfat or low-fat cottage cheese (1% or 2% fat), cheeses made with part skim milk, such as mozzarella, farmers, string, or ricotta. (Cheeses should be labeled no more than 2 to 6 grams fat per oz.). Skim (or 1%) milk: liquid, powdered, or evaporated. Buttermilk made with low-fat milk. Drinks made with skim or low-fat milk or cocoa. Chocolate milk or cocoa made with skim or low-fat (1%) milk. Nonfat or low-fat yogurt.  Avoid: Whole milk cheeses, including colby, cheddar, [place], Monterey Jack, [place], [place], [place], American, Swiss, and blue. Creamed cottage cheese, cream cheese. Whole milk and whole milk products, including buttermilk or yogurt made from whole milk, drinks made from whole milk. Condensed milk, evaporated whole milk, and 2% milk. Soups and Combination Foods  Allowed: Low-fat low-sodium soups: broth, dehydrated soups, homemade broth, soups with the fat removed, homemade cream soups made with skim or low-fat milk. Low-fat spaghetti, lasagna, chili, and Spanish rice if low-fat ingredients and low-fat cooking techniques are used.  Avoid: Cream soups  made with whole milk, cream, or high-fat cheese. All other soups. Desserts and Sweets  Allowed: Sherbet, fruit ices, gelatins, meringues, and angel food cake. Homemade desserts with recommended fats, oils, and milk products. Jam, jelly, honey, marmalade, sugars, and syrups. Pure sugar candy, such as gum drops, hard candy, jelly beans, marshmallows, mints, and small amounts of dark chocolate.  Avoid: Commercially prepared cakes, pies, cookies, frosting, pudding, or mixes for these products. Desserts containing whole milk products, chocolate, coconut, lard, palm oil, or palm kernel oil. Ice cream or ice cream drinks. Candy that contains chocolate, coconut, butter, hydrogenated fat, or unknown ingredients. Buttered syrups. Fats and Oils  Allowed: Vegetable oils: safflower, sunflower, corn, soybean, cottonseed, sesame, canola, olive, or peanut. Non-hydrogenated margarines. Salad dressing or mayonnaise: homemade or commercial, made with a recommended oil. Low or nonfat salad dressing or mayonnaise.  Limit added fats and oils to 6 to 8 tsp per day (includes fats used in cooking, baking, salads, and spreads on bread). Remember to count the "hidden fats" in foods.  Avoid: Solid fats and shortenings: butter, lard, salt pork, bacon drippings. Gravy containing meat fat, shortening, or suet. Cocoa butter, coconut. Coconut oil, palm oil, palm kernel oil, or hydrogenated oils: these ingredients are often used in bakery products, nondairy creamers, whipped toppings, candy, and commercially fried foods. Read labels carefully. Salad dressings made of unknown oils, sour cream, or cheese, such as blue cheese and Roquefort. Cream, all kinds: half-and-half, light, heavy, or whipping. Sour cream or cream cheese (even if "light" or low-fat). Nondairy cream substitutes: coffee creamers and sour cream substitutes made with palm, palm kernel, hydrogenated oils, or coconut oil. Beverages  Allowed: Coffee (regular or  decaffeinated), tea. Diet carbonated beverages, mineral water. Alcohol: Check with your caregiver. Moderation is recommended.  Avoid: Whole milk, regular sodas, and juice drinks with added sugar. Condiments  Allowed: All seasonings and condiments. Cocoa powder. "Cream" sauces made with recommended ingredients.  Avoid: Carob powder made with hydrogenated fats. SAMPLE MENU Breakfast   cup orange juice   cup oatmeal  1 slice toast  1 tsp margarine  1 cup skim milk Lunch  [place] sandwich with 2 oz [place], 2 slices bread  Lettuce and tomato slices  Fresh fruit  Carrot sticks  Coffee or tea Snack  Fresh fruit or low-fat crackers Dinner  3 oz lean ground beef  1 baked potato  1 tsp margarine   cup asparagus  Lettuce salad  1 tbs non-creamy dressing   cup peach slices  1 cup skim milk Document Released: 11/16/2007 Document Revised: 08/08/2011 Document Reviewed: 04/08/2013 [facility] Patient Information 2015 [place], [place]. This information is not intended to replace advice given to you by your health care provider. Make sure you discuss any questions you have with your health care provider.

## 2013-10-13 NOTE — Progress Notes (Signed)
Exercise stress test completed without any symptom. EKG during peak exercise had mild ST depression in inferolateral leads, however asymptomatic. Result pending.  Hilbert Corrigan PA Pager: [phone]

## 2013-10-13 NOTE — Progress Notes (Signed)
ANTICOAGULATION CONSULT NOTE - Follow Up Consult  Pharmacy Consult for heparin Indication: USAP  Labs:  Recent Labs  10/12/13 1430 10/12/13 2005 10/13/13 0101 10/13/13 0310  HGB 14.0  --   --   --   HCT 41.7  --   --   --   PLT 291  --   --   --   LABPROT 12.7  --   --   --   INR 0.95  --   --   --   HEPARINUNFRC  --   --   --  0.25*  CREATININE 0.90  --   --   --   TROPONINI <0.30 <0.30 <0.30  --     Assessment: 58yo female subtherapeutic on heparin with initial dosing for USAP.  Goal of Therapy:  Heparin level 0.3-0.7 units/ml   Plan:  Will increase heparin gtt slightly to 900 units/hr and check level in [place], PharmD, BCPS  10/13/2013,4:19 AM

## 2013-10-13 NOTE — Progress Notes (Signed)
Subjective: No further pain, no SOB.  No palpitations.  Concerned about cardiac cath, would prefer another test.   Objective: Vital signs in last 24 hours: Temp:  [97.7 F (36.5 C)-98.9 F (37.2 C)] 97.7 F (36.5 C) (08/24 0523) Pulse Rate:  [56-82] 56 (08/24 0523) Resp:  [15-20] 15 (08/24 0523) BP: (118-142)/(55-85) 118/55 mmHg (08/24 0523) SpO2:  [98 %-100 %] 100 % (08/24 0523) Weight:  [145 lb (65.772 kg)] 145 lb (65.772 kg) (08/23 1312) Weight change:  Last BM Date: 10/11/13 Intake/Output from previous day: -1000 08/23 0701 - 08/24 0700 In: -  Out: 1000 [Urine:1000] Intake/Output this shift:    PE: General:Pleasant affect, NAD Skin:Warm and dry, brisk capillary refill HEENT:normocephalic, sclera clear, mucus membranes moist Neck:supple, no JVD, no bruits  Heart:S1S2 RRR without murmur, gallup, rub or click Lungs:clear without rales, rhonchi, or wheezes POE:UMPN, non tender, + BS, do not palpate liver spleen or masses Ext:no lower ext edema, 2+ pedal pulses, 2+ radial pulses Neuro:alert and oriented, MAE, follows commands, + facial symmetry  tele:  SR  EKG:  SBrady this AM no acute changes, only slower rate.  Lab Results:  Recent Labs  10/12/13 1430  WBC 8.1  HGB 14.0  HCT 41.7  PLT 291   BMET  Recent Labs  10/12/13 1430  NA 143  K 4.3  CL 102  CO2 25  GLUCOSE 94  BUN 20  CREATININE 0.90  CALCIUM 10.6*    Recent Labs  10/12/13 2005 10/13/13 0101  TROPONINI <0.30 <0.30    Lab Results  Component Value Date   CHOL 212* 10/13/2013   HDL 62 10/13/2013   LDLCALC 107* 10/13/2013   TRIG 214* 10/13/2013   CHOLHDL 3.4 10/13/2013   No results found for this basename: HGBA1C     Lab Results  Component Value Date   TSH 1.94 10/17/2011    Hepatic Function Panel No results found for this basename: PROT, ALBUMIN, AST, ALT, ALKPHOS, BILITOT, BILIDIR, IBILI,  in the last 72 hours  Recent Labs  10/13/13 0101  CHOL 212*   No results  found for this basename: PROTIME,  in the last 72 hours     Studies/Results: Dg Chest 2 View  10/12/2013   CLINICAL DATA:  Irregular heartbeat and shortness of breath for 1 month. Chest pressure  EXAM: CHEST  2 VIEW  COMPARISON:  None.  FINDINGS: The heart size and mediastinal contours are within normal limits. Both lungs are clear. The visualized skeletal structures are unremarkable.  IMPRESSION: No active cardiopulmonary disease.   Electronically Signed   By: Kerby Moors M.D.   On: 10/12/2013 13:44    Medications: I have reviewed the patient's current medications. Scheduled Meds: . active partnership for health of your heart book   Does not apply Once  . aspirin      . aspirin  324 mg Oral Once  . aspirin  324 mg Oral NOW   Or  . aspirin  300 mg Rectal NOW  . aspirin EC  81 mg Oral Daily  . atorvastatin  80 mg Oral q1800  . excerise for heart and health book   Does not apply Once  . metoprolol  5 mg Intravenous Once  . metoprolol tartrate  25 mg Oral BID  . nitroGLYCERIN  0.5 inch Topical 4 times per day  . sodium chloride  3 mL Intravenous Q12H   Continuous Infusions: . sodium chloride    .  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    Principal Problem:   Intermediate coronary syndrome- negative MI, on IV heparin, for cardiac cath today-but not sure if she wishes to undergo, has asked to talk with MD.  Active Problems:   Single kidney   Unstable angina   Heart palpitations- SR   Dyslipidemia-  Now on lipitor    LOS: 1 day   Time spent with pt. :15 minutes. [facility] R  Nurse Practitioner Certified Pager [phone] or after 5pm and on weekends call [phone] 10/13/2013, 8:06 AM   Patient  seen, examined. Available data reviewed. Agree with findings, assessment, and plan as outlined by Cecilie Kicks, NP. Pt CP-free at rest. Only has had sx's with physical exertion. Exam reveals alert,oriented woman in NAD. Lungs CTA, heart RRR without murmur, ext: no edema. Labs/EKG reviewed and within normal limits. She is adamant about not wanting invasive evaluation, nor does she want any test that uses iodinated contrast because of her single kidney. Discussed options and will plan on exercise Myoview today. If negative she is ok for discharge. Will stop heparin.  Sherren Mocha, M.D. 10/13/2013 10:06 AM

## 2013-10-13 NOTE — Progress Notes (Signed)
Myoview injected by nuc med tech, [place], [place] at side [place]

## 2013-10-13 NOTE — Discharge Summary (Signed)
Discharge Summary   Patient ID: Maria Kane,  MRN: [id number], DOB/AGE: Oct 12, 1955 58 y.o.  Admit date: 10/12/2013 Discharge date: 10/13/2013  Primary Care Provider: [facility] Primary Cardiologist: Dr. Meda Coffee  Discharge Diagnoses Principal Problem:   Chest pain Active Problems:   Single kidney   Heart palpitations   Allergies Allergies  Allergen Reactions  . 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 According to the patient, she has been having chest pressure and exertional fatigue has been intermittent for the past 2 months. Most of her symptom start with exertion and resolved with rest. She also noticed periods of palpitation which would last seconds at a time which terminates with coughing. Patient was originally scheduled to have a new patient evaluation by Dr. Meda Coffee in the clinic on 10/16/2013. However, she presented to the [facility] after awoken from sleep with tachycardia palpitations associated with chest pressure. EKG and serial troponin was normal.   Given her presentation, it was originally decided for cardiac catheterization and event monitor, however given her history of single kidney, patient was highly concerned of contrast nephropathy and was unwilling to go through with cardiac catheterization at this time. After discussing with Dr. Burt Knack, it was decided to do a treadmill stress test. Patient walked for 7 minutes on the treadmill without any chest discomfort or dizziness. She did have very mild ST depression in the inferolateral leads despite being asymptomatic at that time. Treadmill nuclear stress test demonstrated EF 78%, no wall motion abnormality, no sign of ischemia or infarction, ST depression in the inferolateral leads was felt to be nondiagnostic with normal perfusion.   Patient  was seen post stress test feeling well. She is deemed stable for discharge from cardiology perspective. I have scheduled a 30 day event monitor for her to pick up in the clinic on the following day. At first, I recommended the patient to have one month followup with Dr. Meda Coffee given her recent negative stress test, however patient states she believes her chest pain will calm back with exertion at home. I will keep her current scheduled followup on 10/16/2013 in case she has recurrent discomfort.   Discharge Vitals Blood pressure 112/57, pulse 74, temperature 97.4 F (36.3 C), temperature source Oral, resp. rate 16, height 5\' 4"  (1.626 m), weight 145 lb (65.772 kg), SpO2 99.00%.  Filed Weights   10/12/13 1312  Weight: 145 lb (65.772 kg)    Labs  CBC  Recent Labs  10/12/13 1430  WBC 8.1  HGB 14.0  HCT 41.7  MCV 97.4  PLT 195   Basic Metabolic Panel  Recent Labs  10/12/13 1430  NA 143  K 4.3  CL 102  CO2 25  GLUCOSE 94  BUN 20  CREATININE 0.90  CALCIUM 10.6*   Cardiac Enzymes  Recent Labs  10/12/13 1430 10/12/13 2005 10/13/13 0101  TROPONINI <0.30 <0.30 <0.30   D-Dimer  Recent Labs  10/12/13 2005  DDIMER <0.27   Fasting Lipid Panel  Recent Labs  10/13/13 0101  CHOL 212*  HDL 62  LDLCALC 107*  TRIG 214*  CHOLHDL 3.4    Disposition  Pt is being discharged home today in good condition.  Follow-up Plans & Appointments      Follow-up Information   Follow up with Dorothy Spark, MD On 10/16/2013. (10:00am)    Specialty:  Cardiology  Contact information:   [address] [place] [address] [phone]       Follow up with [facility] On 10/14/2013. (11:00am)    Specialty:  Cardiology   Contact information:   [address] [place] [address] [phone]      Discharge Medications    Medication List         aspirin 81 MG EC tablet  Take 1 tablet (81 mg total) by mouth  daily.     estradiol 0.025 MG/24HR  Commonly known as:  VIVELLE-DOT  Place 1 patch onto the skin 2 (two) times a week.     LORazepam 0.5 MG tablet  Commonly known as:  ATIVAN  Take 0.5 mg by mouth 2 (two) times a week.     metoprolol tartrate 25 MG tablet  Commonly known as:  LOPRESSOR  Take 1 tablet (25 mg total) by mouth 2 (two) times daily.     SAM-E PO  Take by mouth. 1600 mg daily          Duration of Discharge Encounter   Greater than 30 minutes including physician time.  Hilbert Corrigan PA-C Pager: [phone] 10/13/2013, 4:17 PM   Stress Myoview negative. Agree with plans as outlined above.   Sherren Mocha MD 10/13/2013 4:26 PM

## 2013-10-13 NOTE — Progress Notes (Addendum)
Treadmill started, PA-C at side.  Target heartrate 139

## 2013-10-14 ENCOUNTER — Encounter: Payer: Self-pay | Admitting: *Deleted

## 2013-10-14 DIAGNOSIS — R002 Palpitations: Secondary | ICD-10-CM

## 2013-10-14 NOTE — ED Provider Notes (Signed)
Medical screening examination/treatment/procedure(s) were performed by non-physician practitioner and as supervising physician I was immediately available for consultation/collaboration.   EKG Interpretation   Date/Time:  Sunday October 12 2013 13:18:18 EDT Ventricular Rate:  89 PR Interval:  134 QRS Duration: 86 QT Interval:  384 QTC Calculation: 467 R Axis:   73 Text Interpretation:  Normal sinus rhythm Normal ECG No significant change  since last tracing Confirmed by Cicero Noy  MD, Lether Tesch ([id number]) on 10/12/2013  1:48:20 PM        Ephraim Hamburger, MD 10/14/13 [phone]

## 2013-10-14 NOTE — Progress Notes (Unsigned)
Patient ID: Maria Kane, female   DOB: February 17, 1956, 58 y.o.   MRN: [id number] Patient decided not to start [employer] 30 day cardiac event monitor until 10/25/2013.  Monitor was sent home with patient.  She will contact [employer] 10/25/2013 to send baseline recording.

## 2013-10-14 NOTE — Progress Notes (Signed)
Patient ID: Maria Kane, female   DOB: [birth year], 59 y.o.   MRN: [id number] [employer] 30 day cardiac event monitor applied to patient.

## 2013-10-16 ENCOUNTER — Encounter: Payer: Self-pay | Admitting: *Deleted

## 2013-10-16 ENCOUNTER — Ambulatory Visit (INDEPENDENT_AMBULATORY_CARE_PROVIDER_SITE_OTHER): Payer: 59 | Admitting: Cardiology

## 2013-10-16 ENCOUNTER — Ambulatory Visit (INDEPENDENT_AMBULATORY_CARE_PROVIDER_SITE_OTHER)
Admission: RE | Admit: 2013-10-16 | Discharge: 2013-10-16 | Disposition: A | Discharge status: Home / Self Care | Payer: Self-pay | Source: Ambulatory Visit | Attending: Cardiology | Admitting: Cardiology

## 2013-10-16 ENCOUNTER — Encounter: Payer: Self-pay | Admitting: Cardiology

## 2013-10-16 VITALS — BP 124/70 | HR 80 | Ht 64.0 in | Wt 149.0 lb

## 2013-10-16 DIAGNOSIS — R079 Chest pain, unspecified: Secondary | ICD-10-CM

## 2013-10-16 DIAGNOSIS — R0609 Other forms of dyspnea: Secondary | ICD-10-CM

## 2013-10-16 DIAGNOSIS — R002 Palpitations: Secondary | ICD-10-CM

## 2013-10-16 DIAGNOSIS — R0600 Dyspnea, unspecified: Secondary | ICD-10-CM | POA: Insufficient documentation

## 2013-10-16 DIAGNOSIS — E785 Hyperlipidemia, unspecified: Secondary | ICD-10-CM | POA: Insufficient documentation

## 2013-10-16 DIAGNOSIS — R0989 Other specified symptoms and signs involving the circulatory and respiratory systems: Secondary | ICD-10-CM

## 2013-10-16 NOTE — Progress Notes (Signed)
Patient ID: Walterine Amodei, female   DOB: March 02, 1955, 58 y.o.   MRN: [id number]     Patient Name: Maria Kane Date of Encounter: 10/16/2013  Primary Care Provider:  Kelton Pillar, MD Primary Cardiologist:  Dorothy Spark   Problem List   Past Medical History  Diagnosis Date  . Allergic rhinitis     dogs, dust, tree and ragweed  . Osteopenia     dexa 11/09 -1.4 spine, -1.2 hip  . Primiparous   . History of chickenpox   . Single kidney     left removed cong UPJ obstruction  . Pineal gland cyst     incidental finding  . Heart palpitations 10/13/2013   Past Surgical History  Procedure Laterality Date  . Appendectomy  2006  . Cholecystectomy  2001  . Abdominal hysterectomy  1996    age 58 total severe endometriosis  . Tonsillectomy  1965  . Left kidney  2002    removed congential UPJ obstruction     Allergies  Allergies  Allergen Reactions  . 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 According to the patient, she has been having chest pressure and exertional fatigue has been intermittent for the past 2 months. Most of her symptom start with exertion and resolved with rest. She also noticed periods of palpitation which would last seconds at a time which terminates with coughing. Patient was originally scheduled to have a new patient evaluation by Dr. Meda Coffee in the clinic on 10/16/2013. However, she presented to the [facility] after awoken from sleep with tachycardia palpitations associated with chest pressure. EKG and serial troponin was normal.  Given her presentation, it was originally decided for cardiac catheterization and event monitor, however given her history of single kidney, patient was highly concerned of contrast nephropathy and was unwilling to go through with cardiac catheterization at this time.  After discussing with Dr. Burt Knack, it was decided to do a treadmill stress test. Patient walked for 7 minutes on the treadmill without any chest discomfort or dizziness. She did have very mild ST depression in the inferolateral leads despite being asymptomatic at that time. Treadmill nuclear stress test demonstrated EF 78%, no wall motion abnormality, no sign of ischemia or infarction, ST depression in the inferolateral leads was felt to be nondiagnostic with normal perfusion.  Patient was seen post stress test feeling well. She is deemed stable for discharge from cardiology perspective. I have scheduled a 30 day event monitor for her to pick up in the clinic on the following day. At first, I recommended the patient to have one month followup with Dr. Meda Coffee given her recent negative stress test, however patient states she believes her chest pain will calm back with exertion at home. I will keep her current scheduled followup on 10/16/2013 in case she has recurrent discomfort.   The patient is coming for posthospitalization followup. She states that since her discharge she tried to walk again and hasn't experienced any more symptoms. She denies any recurrent chest pain, shortness of breath or palpitation. She hasn't been taking metoprolol and would like to return loop recorder.  Home Medications  Prior to Admission medications   Medication Sig Start Date End Date Taking? Authorizing Provider  aspirin EC 81 MG EC tablet Take 1 tablet (81 mg total) by mouth daily. 10/13/13  Yes Almyra Deforest, PA  estradiol (VIVELLE-DOT) 0.025 MG/24HR Place 1 patch onto the skin 2 (two) times a week. 08/20/12  Yes Lanice Shirts, MD  LORazepam (ATIVAN) 0.5 MG tablet Take 0.5 mg by mouth 2 (two) times a week.   Yes Historical Provider, MD  S-Adenosylmethionine (SAM-E PO) Take by mouth. 1600 mg daily    Yes Historical Provider, MD  metoprolol tartrate (LOPRESSOR) 25 MG tablet Take 1 tablet (25 mg total) by mouth 2 (two) times  daily. 10/13/13   Almyra Deforest, PA    Family History  Family History  Problem Relation Age of Onset  . Breast cancer Mother   . Osteoarthritis Mother   . Multiple sclerosis Mother   . Hypertension Father   . Lung cancer Father   . Stroke Father   . Heart disease Father     pacemaker  . Diabetes Sister   . Hypertension Sister   . Polymyositis Sister   . Diabetes Brother   . Hypertension Brother     Social History  History   Social History  . Marital Status: Married    Spouse Name: N/A    Number of Children: N/A  . Years of Education: N/A   Occupational History  . Not on file.   Social History Main Topics  . Smoking status: Never Smoker   . Smokeless tobacco: Not on file  . Alcohol Use: 0.0 oz/week    2-3 Glasses of wine per week  . Drug Use: No  . Sexual Activity: Yes    Birth Control/ Protection: Surgical   Other Topics Concern  . Not on file   Social History Narrative   1/2 brother- schizopherenia   Occupation: Ph.D- Publishing rights manager   Married   [place] of 4   Regular exercise- yes   2 adopted kids 2 dogs   Originally from [place], as per HPI, otherwise negative General:  No chills, fever, night sweats or weight changes.  Cardiovascular:  No chest pain, dyspnea on exertion, edema, orthopnea, palpitations, paroxysmal nocturnal dyspnea. Dermatological: No rash, lesions/masses Respiratory: No cough, dyspnea Urologic: No hematuria, dysuria Abdominal:   No nausea, vomiting, diarrhea, bright red blood per rectum, melena, or hematemesis Neurologic:  No visual changes, wkns, changes in mental status. All other systems reviewed and are otherwise negative except as noted above.  Physical Exam  Blood pressure 124/70, pulse 80, height 5\' 4"  (1.626 m), weight 149 lb (67.586 kg), SpO2 99.00%.  General: Pleasant, NAD Psych: Normal affect. Neuro: Alert and oriented X 3. Moves all extremities spontaneously. HEENT: Normal  Neck: Supple  without bruits or JVD. Lungs:  Resp regular and unlabored, CTA. Heart: RRR no s3, s4, or murmurs. Abdomen: Soft, non-tender, non-distended, BS + x 4.  Extremities: No clubbing, cyanosis or edema. DP/PT/Radials 2+ and equal bilaterally.  Labs:  No results found for this basename: CKTOTAL, CKMB, TROPONINI,  in the last 72 hours Lab Results  Component Value Date   WBC 8.1 10/12/2013   HGB 14.0 10/12/2013   HCT 41.7 10/12/2013   MCV 97.4 10/12/2013   PLT 291 10/12/2013    Lab Results  Component Value Date   DDIMER <0.27 10/12/2013   No components found with this basename: POCBNP,     Component Value Date/Time   NA 143 10/12/2013 1430   K 4.3 10/12/2013 1430   CL 102 10/12/2013 1430   CO2 25 10/12/2013 1430   GLUCOSE 94 10/12/2013 1430   GLUCOSE 89 12/28/2005 0843  BUN 20 10/12/2013 1430   CREATININE 0.90 10/12/2013 1430   CALCIUM 10.6* 10/12/2013 1430   PROT 6.9 10/17/2011 1115   ALBUMIN 4.3 10/17/2011 1115   AST 22 10/17/2011 1115   ALT 20 10/17/2011 1115   ALKPHOS 56 10/17/2011 1115   BILITOT 0.7 10/17/2011 1115   GFRNONAA 70* 10/12/2013 1430   GFRAA 81* 10/12/2013 1430   Lab Results  Component Value Date   CHOL 212* 10/13/2013   HDL 62 10/13/2013   LDLCALC 107* 10/13/2013   TRIG 214* 10/13/2013    Accessory Clinical Findings  Echocardiogram - none  ECG - sinus bradycardia, otherwise normal EKG  Exercise nuclear stress test: IMPRESSION:  Normal stress myovue with no chest pain, ST depression in the  inferior lateral leads felt to be nondiagnostic and normal perfusion  with no ischemia or infarction. The gated ejection fraction was 78%  and no wall motion was normal.   Assessment & Plan  58 year old female who was recently admitted for an episode of   1. typical exertional chest pain, rule out for ACS, and underwent exercise nuclear stress test. There were 1 mm ST depressions during the peaks exercise. Perfusion images where read as normal but on the second review there is  possible distal LAD mild and small area of ischemia.  After review of her test results we decided to proceed with calcium score in our office for risk stratification considering risk factors that are patient being postmenopausal for the last 10 years, family history of CAD, and elevated triglycerides and LDL despite a very good diet and high level of exercise. Calcium score in our office today is very reassuring and is 0 and therefore we will try work further on her diet and repeat her lipids in 6 months. For now she is encouraged to continue exercising. She wants to follow with pulmonary and she has really bad seasonal allergies and feel that underlying lung problem might be a culprit for her symptoms.  2. Hypertension - well controlled  3. Hyperlipidemia - elevated LDL and triglycerides, management as about considering calcium score of 0 we will proceed with conservative management patient was advised to use red yeast rice for now.  4. Palpitations - resolved patient turning her loop recorder date she hasn't used.  Followup in 1 months to check on her symptoms.    Dorothy Spark, MD, [facility] 10/16/2013, 10:46 AM

## 2013-10-16 NOTE — Progress Notes (Signed)
Utilization review completed- retro 

## 2013-10-16 NOTE — Progress Notes (Signed)
Patient ID: Maria Kane, female   DOB: August 27, 1955, 58 y.o.   MRN: [id number] Patient returned [employer] monitor to Dr. Meda Coffee 10/16/2013 without starting monitor. Patient was going to start 10/25/2013 but decided against it.  [employer] will be notified monitor returned.

## 2013-10-16 NOTE — Patient Instructions (Signed)
Your physician recommends that you continue on your current medications as directed. Please refer to the Current Medication list given to you today.   YOUR MD HAS ORDERED FOR YOU TO HAVE A CT CARDIAC SCORING DONE TODAY.  Your physician recommends that you schedule a follow-up appointment in: IN ONE [place]

## 2013-11-12 ENCOUNTER — Ambulatory Visit: Payer: 59

## 2013-11-27 ENCOUNTER — Encounter: Payer: Self-pay | Admitting: Cardiology

## 2013-11-27 ENCOUNTER — Ambulatory Visit (INDEPENDENT_AMBULATORY_CARE_PROVIDER_SITE_OTHER): Payer: 59 | Admitting: Cardiology

## 2013-11-27 VITALS — BP 128/80 | HR 68 | Ht 64.0 in | Wt 151.0 lb

## 2013-11-27 DIAGNOSIS — R0609 Other forms of dyspnea: Secondary | ICD-10-CM | POA: Insufficient documentation

## 2013-11-27 DIAGNOSIS — R0600 Dyspnea, unspecified: Secondary | ICD-10-CM

## 2013-11-27 NOTE — Progress Notes (Signed)
Patient ID: Maria Kane, female   DOB: 1955-04-10, 58 y.o.   MRN: [id number]     Patient Name: Maria Kane Date of Encounter: 11/27/2013  Primary Care Provider:  Kelton Pillar, MD Primary Cardiologist:  Dorothy Spark   Problem List   Past Medical History  Diagnosis Date  . Allergic rhinitis     dogs, dust, tree and ragweed  . Osteopenia     dexa 11/09 -1.4 spine, -1.2 hip  . Primiparous   . History of chickenpox   . Single kidney     left removed cong UPJ obstruction  . Pineal gland cyst     incidental finding  . Heart palpitations 10/13/2013   Past Surgical History  Procedure Laterality Date  . Appendectomy  2006  . Cholecystectomy  2001  . Abdominal hysterectomy  1996    age 88 total severe endometriosis  . Tonsillectomy  1965  . Left kidney  2002    removed congential UPJ obstruction     Allergies  Allergies  Allergen Reactions  . 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 According to the patient, she has been having chest pressure and exertional fatigue has been intermittent for the past 2 months. Most of her symptom start with exertion and resolved with rest. She also noticed periods of palpitation which would last seconds at a time which terminates with coughing. Patient was originally scheduled to have a new patient evaluation by Dr. Meda Coffee in the clinic on 10/16/2013. However, she presented to the [facility] after awoken from sleep with tachycardia palpitations associated with chest pressure. EKG and serial troponin was normal.  Given her presentation, it was originally decided for cardiac catheterization and event monitor, however given her history of single kidney, patient was highly concerned of contrast nephropathy and was unwilling to go through with cardiac catheterization at this time.  After discussing with Dr. Burt Knack, it was decided to do a treadmill stress test. Patient walked for 7 minutes on the treadmill without any chest discomfort or dizziness. She did have very mild ST depression in the inferolateral leads despite being asymptomatic at that time. Treadmill nuclear stress test demonstrated EF 78%, no wall motion abnormality, no sign of ischemia or infarction, ST depression in the inferolateral leads was felt to be nondiagnostic with normal perfusion.  Patient was seen post stress test feeling well. She is deemed stable for discharge from cardiology perspective. I have scheduled a 30 day event monitor for her to pick up in the clinic on the following day. At first, I recommended the patient to have one month followup with Dr. Meda Coffee given her recent negative stress test, however patient states she believes her chest pain will calm back with exertion at home. I will keep her current scheduled followup on 10/16/2013 in case she has recurrent discomfort.   The patient is coming for posthospitalization followup. She states that since her discharge she tried to walk again and hasn't experienced any more symptoms. She denies any recurrent chest pain, shortness of breath or palpitation. She hasn't been taking metoprolol and would like to return loop recorder.  11/26/2013 - she continues to feel SOB, she has stopped using hormones (post hysterectomy at age 64) and is going through menopausal symptoms. She has gained a lot of weight and has no time to exercise.  Home Medications  Prior to Admission medications   Medication Sig Start Date End Date Taking? Authorizing Provider  aspirin EC 81 MG EC tablet Take 1 tablet (81 mg total) by mouth daily. 10/13/13  Yes Almyra Deforest, PA  estradiol (VIVELLE-DOT) 0.025 MG/24HR Place 1 patch onto the skin 2 (two) times a week. 08/20/12  Yes Lanice Shirts, MD  LORazepam (ATIVAN) 0.5 MG tablet Take 0.5 mg by mouth 2 (two) times a week.   Yes Historical  Provider, MD  S-Adenosylmethionine (SAM-E PO) Take by mouth. 1600 mg daily    Yes Historical Provider, MD  metoprolol tartrate (LOPRESSOR) 25 MG tablet Take 1 tablet (25 mg total) by mouth 2 (two) times daily. 10/13/13   Almyra Deforest, PA    Family History  Family History  Problem Relation Age of Onset  . Breast cancer Mother   . Osteoarthritis Mother   . Multiple sclerosis Mother   . Hypertension Father   . Lung cancer Father   . Stroke Father   . Heart disease Father     pacemaker  . Diabetes Sister   . Hypertension Sister   . Polymyositis Sister   . Diabetes Brother   . Hypertension Brother     Social History  History   Social History  . Marital Status: Married    Spouse Name: N/A    Number of Children: N/A  . Years of Education: N/A   Occupational History  . Not on file.   Social History Main Topics  . Smoking status: Never Smoker   . Smokeless tobacco: Not on file  . Alcohol Use: 0.0 oz/week    2-3 Glasses of wine per week  . Drug Use: No  . Sexual Activity: Yes    Birth Control/ Protection: Surgical   Other Topics Concern  . Not on file   Social History Narrative   1/2 brother- schizopherenia   Occupation: Ph.D- Publishing rights manager   Married   [place] of 4   Regular exercise- yes   2 adopted kids 2 dogs   Originally from [place], as per HPI, otherwise negative General:  No chills, fever, night sweats or weight changes.  Cardiovascular:  No chest pain, dyspnea on exertion, edema, orthopnea, palpitations, paroxysmal nocturnal dyspnea. Dermatological: No rash, lesions/masses Respiratory: No cough, dyspnea Urologic: No hematuria, dysuria Abdominal:   No nausea, vomiting, diarrhea, bright red blood per rectum, melena, or hematemesis Neurologic:  No visual changes, wkns, changes in mental status. All other systems reviewed and are otherwise negative except as noted above.  Physical Exam  There were no vitals taken for this visit.    General: Pleasant, NAD Psych: Normal affect. Neuro: Alert and oriented X 3. Moves all extremities spontaneously. HEENT: Normal  Neck: Supple without bruits or JVD. Lungs:  Resp regular and unlabored, CTA. Heart: RRR no s3, s4, or murmurs. Abdomen: Soft, non-tender, non-distended, BS + x 4.  Extremities: No clubbing, cyanosis or edema. DP/PT/Radials 2+ and equal bilaterally.  Labs:  No results found for this basename: CKTOTAL, CKMB, TROPONINI,  in the last 72 hours Lab Results  Component Value Date   WBC 8.1 10/12/2013   HGB 14.0 10/12/2013   HCT 41.7 10/12/2013   MCV 97.4 10/12/2013   PLT 291 10/12/2013    Lab Results  Component Value Date   DDIMER <0.27 10/12/2013   No components found with this basename: POCBNP,     Component Value Date/Time   NA 143 10/12/2013 1430  K 4.3 10/12/2013 1430   CL 102 10/12/2013 1430   CO2 25 10/12/2013 1430   GLUCOSE 94 10/12/2013 1430   GLUCOSE 89 12/28/2005 0843   BUN 20 10/12/2013 1430   CREATININE 0.90 10/12/2013 1430   CALCIUM 10.6* 10/12/2013 1430   PROT 6.9 10/17/2011 1115   ALBUMIN 4.3 10/17/2011 1115   AST 22 10/17/2011 1115   ALT 20 10/17/2011 1115   ALKPHOS 56 10/17/2011 1115   BILITOT 0.7 10/17/2011 1115   GFRNONAA 70* 10/12/2013 1430   GFRAA 81* 10/12/2013 1430   Lab Results  Component Value Date   CHOL 212* 10/13/2013   HDL 62 10/13/2013   LDLCALC 107* 10/13/2013   TRIG 214* 10/13/2013    Accessory Clinical Findings  Echocardiogram - none  ECG - sinus bradycardia, otherwise normal EKG  Exercise nuclear stress test: IMPRESSION:  Normal stress myovue with no chest pain, ST depression in the  inferior lateral leads felt to be nondiagnostic and normal perfusion  with no ischemia or infarction. The gated ejection fraction was 78%  and no wall motion was normal.   Assessment & Plan  58 year old female who was recently admitted for an episode of   1. typical exertional chest pain, rule out for ACS, and underwent exercise nuclear  stress test. There were 1 mm ST depressions during the peaks exercise. Perfusion images where read as normal but on the second review there is possible distal LAD mild and small area of ischemia.  After review of her test results we decided to proceed with calcium score in our office for risk stratification considering risk factors that are patient being postmenopausal for the last 10 years, family history of CAD, and elevated triglycerides and LDL despite a very good diet and high level of exercise. Calcium score in our office today is very reassuring and is 0 and therefore we will try work further on her diet and repeat her lipids in 6 months. For now she is encouraged to continue exercising. She wants to follow with pulmonary and she has really bad seasonal allergies and feel that underlying lung problem might be a culprit for her symptoms.  2. Hypertension - well controlled  3. Hyperlipidemia - elevated LDL and triglycerides, management as about considering calcium score of 0 we will proceed with conservative management patient was advised to use fish oil only. She is reluctant to take even red yeast rice as her sister has polymyositis and is was blamed on statins.  4. Palpitations - resolved patient turning her loop recorder date she hasn't used.  Followup in 1 year.    Dorothy Spark, MD, [facility] [place] 11/27/2013, 12:15 PM

## 2013-11-27 NOTE — Patient Instructions (Signed)
Your physician recommends that you continue on your current medications as directed. Please refer to the Current Medication list given to you today.   Your physician wants you to follow-up in: ONE YEAR WITH DR NELSON You will receive a reminder letter in the mail two months in advance. If you don't receive a letter, please call our office to schedule the follow-up appointment.  

## 2013-12-25 ENCOUNTER — Other Ambulatory Visit: Payer: Self-pay | Admitting: *Deleted

## 2013-12-25 ENCOUNTER — Encounter: Payer: Self-pay | Admitting: Pulmonary Disease

## 2013-12-25 ENCOUNTER — Ambulatory Visit (INDEPENDENT_AMBULATORY_CARE_PROVIDER_SITE_OTHER): Payer: 59 | Admitting: Pulmonary Disease

## 2013-12-25 VITALS — BP 128/78 | HR 87 | Temp 98.1°F | Ht 64.5 in | Wt 149.1 lb

## 2013-12-25 DIAGNOSIS — R0609 Other forms of dyspnea: Secondary | ICD-10-CM

## 2013-12-25 DIAGNOSIS — R911 Solitary pulmonary nodule: Secondary | ICD-10-CM | POA: Insufficient documentation

## 2013-12-25 MED ORDER — LORAZEPAM 0.5 MG PO TABS: 0.5000 mg | ORAL_TABLET | ORAL | Status: DC

## 2013-12-25 NOTE — Patient Instructions (Signed)
Breathing test in 2-3 weeks OK to take dayquil/ decongestant Take DELSYM cough syrup if worse Call for antibiotic if yellow green phlegm Treatment for allergies 55m FU CT scan for nodules in March 2016

## 2013-12-25 NOTE — Assessment & Plan Note (Addendum)
71m FU CT scan for nodules in March 2016 Given strong family history of lung cancer in her father, we decided on a 6 month follow-up CT

## 2013-12-25 NOTE — Progress Notes (Signed)
Subjective:    Patient ID: Maria Kane, female    DOB: 1955/07/17, 58 y.o.   MRN: [id number]  HPI  58 year old never smoker presents for evaluation of dyspnea on exertion since summer of 2015. She reports a feeling of chest tightness, especially when exercising, walking up hills. This improves when she stops exercising. She reports similar symptoms 5 years ago during cycling class which he attributed to indoor humidity. She underwent a cardiac workup by Dr. Luvenia Redden treadmill stress test, CT chest 09/2013 had a low calcium score. Incidentally this picked up a 5 mm nodule in the left lower lobe and another 4 mm nodule in the right mid lung. She reports seasonal allergies in the spring and fall, and takes allergy shots every 2 weeks by Dr. Donneta Romberg, allergy testing was positive for ragweed, trees and dogs. She reports URI that started with sneezing and a sore throat for the past 4 days, and a dry cough now. An oral decongestant has provided mild relief. She smoked for about 3 years as a teenager but never persisted, is a social drinker. She is self-employed as a Engineer, water for [employer]. Her father had lung cancer and siblings have allergic tendencies. She reports a 10 pound weight gain since onset of dyspnea. There is no family history of venous thromboembolism. Her sister was diagnosed with polymyositis attributed to statins  Past Medical History  Diagnosis Date  . Allergic rhinitis     dogs, dust, tree and ragweed  . Osteopenia     dexa 11/09 -1.4 spine, -1.2 hip  . Primiparous   . History of chickenpox   . Single kidney     left removed cong UPJ obstruction  . Pineal gland cyst     incidental finding  . Heart palpitations 10/13/2013    Past Surgical History  Procedure Laterality Date  . Appendectomy  2006  . Cholecystectomy  2001  . Abdominal hysterectomy  1996    age 44 total severe endometriosis  . Tonsillectomy  1965  . Left kidney  2002    removed  congential UPJ obstruction     Allergies  Allergen Reactions  . Clarithromycin Nausea And Vomiting    History   Social History  . Marital Status: Married    Spouse Name: N/A    Number of Children: N/A  . Years of Education: N/A   Occupational History  . Business Psychologist    Social History Main Topics  . Smoking status: Never Smoker   . Smokeless tobacco: Not on file  . Alcohol Use: 5.4 oz/week    9 Glasses of wine per week  . Drug Use: No  . Sexual Activity: Yes    Birth Control/ Protection: Surgical   Other Topics Concern  . Not on file   Social History Narrative   1/2 brother- schizopherenia   Occupation: Ph.D- Publishing rights manager   Married   [place] of 4   Regular exercise- yes   2 adopted kids 2 dogs   Originally from [place]   Family History  Problem Relation Age of Onset  . Breast cancer Mother   . Osteoarthritis Mother   . Multiple sclerosis Mother   . Hypertension Father   . Lung cancer Father   . Stroke Father   . Heart disease Father     pacemaker  . Diabetes Sister   . Hypertension Sister   . Polymyositis Sister   . Diabetes Brother   . Hypertension Brother  Review of Systems  Constitutional: Positive for unexpected weight change. Negative for fever.  HENT: Negative for congestion, dental problem, ear pain, nosebleeds, postnasal drip, rhinorrhea, sinus pressure, sneezing, sore throat and trouble swallowing.   Eyes: Negative for redness and itching.  Respiratory: Positive for shortness of breath. Negative for cough, chest tightness and wheezing.   Cardiovascular: Positive for palpitations. Negative for leg swelling.  Gastrointestinal: Negative for nausea and vomiting.  Genitourinary: Negative for dysuria.  Musculoskeletal: Negative for joint swelling.  Skin: Negative for rash.  Neurological: Negative for headaches.  Hematological: Does not bruise/bleed easily.  Psychiatric/Behavioral: Negative for dysphoric mood. The patient is  not nervous/anxious.        Objective:   Physical Exam  Gen. Pleasant, well-nourished, in no distress, normal affect ENT - no lesions, no post nasal drip Neck: No JVD, no thyromegaly, no carotid bruits Lungs: no use of accessory muscles, no dullness to percussion, clear without rales or rhonchi  Cardiovascular: Rhythm regular, heart sounds  normal, no murmurs or gallops, no peripheral edema Abdomen: soft and non-tender, no hepatosplenomegaly, BS normal. Musculoskeletal: No deformities, no cyanosis or clubbing Neuro:  alert, non focal       Assessment & Plan:

## 2013-12-25 NOTE — Assessment & Plan Note (Addendum)
Unclear cause -no evidence of interstitial lung disease on imaging, this could be adult onset asthma-exercise-induced or simply related to allergies. No reason to suspect VTE. Spirometry  in 2-3 weeks, will need post exercise testing if baseline nml OK to take dayquil/ decongestant Take DELSYM cough syrup if worse Call for antibiotic if yellow green phlegm Treatment for allergies

## 2013-12-26 ENCOUNTER — Telehealth: Payer: Self-pay | Admitting: Pulmonary Disease

## 2013-12-26 ENCOUNTER — Ambulatory Visit: Payer: 59 | Admitting: Cardiovascular Disease

## 2013-12-26 MED ORDER — CEFDINIR 300 MG PO CAPS: 300.0000 mg | ORAL_CAPSULE | Freq: Two times a day (BID) | ORAL | Status: DC

## 2013-12-26 NOTE — Telephone Encounter (Signed)
omnicef 300mg bid x 7d

## 2013-12-26 NOTE — Telephone Encounter (Signed)
Called and spoke to pt. Pt stated she saw RA yesterday, 12/25/13, and was told to call for abx when she has discolored mucus. Pt c/o prod cough with green mucus. Pt denies SOB, f/c/s or CP/tightness. Pt is using Delsym and Dayquil. Pt is going out of town on 11/7 and would like abx.   MW please advise.  Allergies  Allergen Reactions  . Clarithromycin Nausea And Vomiting

## 2013-12-26 NOTE — Telephone Encounter (Signed)
Called and spoke to pt. Informed pt of the recs per MW. Rx sent to preferred pharmacy. Pt verbalized understanding and denied any further questions or concerns at this time.   

## 2013-12-26 NOTE — Telephone Encounter (Signed)
Duplicate message. Please see other phone note from 11/6. Will sign off.

## 2014-01-08 ENCOUNTER — Ambulatory Visit (INDEPENDENT_AMBULATORY_CARE_PROVIDER_SITE_OTHER): Payer: 59 | Admitting: Pulmonary Disease

## 2014-01-08 ENCOUNTER — Encounter: Payer: Self-pay | Admitting: Pulmonary Disease

## 2014-01-08 VITALS — BP 110/58 | HR 88 | Ht 64.5 in | Wt 152.0 lb

## 2014-01-08 DIAGNOSIS — R0609 Other forms of dyspnea: Secondary | ICD-10-CM

## 2014-01-08 DIAGNOSIS — R911 Solitary pulmonary nodule: Secondary | ICD-10-CM

## 2014-01-08 NOTE — Progress Notes (Signed)
   Subjective:    Patient ID: Maria Kane, female    DOB: 03-Jul-1955, 58 y.o.   MRN: [id number]  HPI  58 year old never smoker presents for evaluation of dyspnea on exertion since summer of 2015. She reports a feeling of chest tightness, especially when exercising, walking up hills. This improves when she stops exercising. She reports similar symptoms 5 years ago during cycling class which he attributed to indoor humidity. She underwent a cardiac workup by Dr. Luvenia Redden treadmill stress test, CT chest 09/2013 had a low calcium score. Incidentally this picked up a 5 mm nodule in the left lower lobe and another 4 mm nodule in the right mid lung. She reports seasonal allergies in the spring and fall, and takes allergy shots every 2 weeks by Dr. Donneta Romberg, allergy testing was positive for ragweed, trees and dogs. She reports URI that started with sneezing and a sore throat for the past 4 days, and a dry cough now. An oral decongestant has provided mild relief. She smoked for about 3 years as a teenager but never persisted, is a social drinker. She is self-employed as a Engineer, water for [employer]. Her father had lung cancer and siblings have allergic tendencies. She reports a 10 pound weight gain since onset of dyspnea. There is no family history of venous thromboembolism. Her sister was diagnosed with polymyositis attributed to statins  > dayquil/delsym/ given omnicef Spirometry >> no airway obstruction Post exercise - HR increased to 140, but no obstruction D-dimer neg 09/2013   Review of Systems neg for any significant sore throat, dysphagia, itching, sneezing, nasal congestion or excess/ purulent secretions, fever, chills, sweats, unintended wt loss, pleuritic or exertional cp, hempoptysis, orthopnea pnd or change in chronic leg swelling. Also denies presyncope, palpitations, heartburn, abdominal pain, nausea, vomiting, diarrhea or change in bowel or urinary habits,  dysuria,hematuria, rash, arthralgias, visual complaints, headache, numbness weakness or ataxia.     Objective:   Physical Exam  Gen. Pleasant, well-nourished, in no distress ENT - no lesions, no post nasal drip Neck: No JVD, no thyromegaly, no carotid bruits Lungs: no use of accessory muscles, no dullness to percussion, clear without rales or rhonchi  Cardiovascular: Rhythm regular, heart sounds  normal, no murmurs or gallops, no peripheral edema Musculoskeletal: No deformities, no cyanosis or clubbing        Assessment & Plan:

## 2014-01-08 NOTE — Assessment & Plan Note (Signed)
17m FU CT in march 2016

## 2014-01-08 NOTE — Patient Instructions (Signed)
Ct chest in march 2016 Graduated exercise

## 2014-01-08 NOTE — Assessment & Plan Note (Signed)
Unclear cause -no evidence of interstitial lung disease on imaging, does not seem to be asthma-exercise-induced , could be deconditioning or simply related to allergies. No reason to suspect VTE. Graduated exercise for now

## 2014-01-29 ENCOUNTER — Other Ambulatory Visit: Payer: Self-pay | Admitting: *Deleted

## 2014-01-29 DIAGNOSIS — Z0000 Encounter for general adult medical examination without abnormal findings: Secondary | ICD-10-CM

## 2014-01-30 LAB — CBC WITH DIFFERENTIAL/PLATELET
Basophils Absolute: 0.1 10*3/uL (ref 0.0–0.1)
Basophils Relative: 1 % (ref 0–1)
EOS ABS: 0.3 10*3/uL (ref 0.0–0.7)
Eosinophils Relative: 4 % (ref 0–5)
HEMATOCRIT: 39.5 % (ref 36.0–46.0)
Hemoglobin: 13.4 g/dL (ref 12.0–15.0)
Lymphocytes Relative: 39 % (ref 12–46)
Lymphs Abs: 2.8 10*3/uL (ref 0.7–4.0)
MCH: 31.8 pg (ref 26.0–34.0)
MCHC: 33.9 g/dL (ref 30.0–36.0)
MCV: 93.6 fL (ref 78.0–100.0)
MONO ABS: 0.6 10*3/uL (ref 0.1–1.0)
MONOS PCT: 8 % (ref 3–12)
MPV: 11.4 fL (ref 9.4–12.4)
NEUTROS PCT: 48 % (ref 43–77)
Neutro Abs: 3.4 10*3/uL (ref 1.7–7.7)
Platelets: 289 10*3/uL (ref 150–400)
RBC: 4.22 MIL/uL (ref 3.87–5.11)
RDW: 13.6 % (ref 11.5–15.5)
WBC: 7.1 10*3/uL (ref 4.0–10.5)

## 2014-01-30 LAB — COMPLETE METABOLIC PANEL WITH GFR
ALT: 22 U/L (ref 0–35)
AST: 20 U/L (ref 0–37)
Albumin: 4.6 g/dL (ref 3.5–5.2)
Alkaline Phosphatase: 75 U/L (ref 39–117)
BILIRUBIN TOTAL: 0.4 mg/dL (ref 0.2–1.2)
BUN: 25 mg/dL — ABNORMAL HIGH (ref 6–23)
CO2: 27 mEq/L (ref 19–32)
Calcium: 9.9 mg/dL (ref 8.4–10.5)
Chloride: 105 mEq/L (ref 96–112)
Creat: 1.04 mg/dL (ref 0.50–1.10)
GFR, EST NON AFRICAN AMERICAN: 60 mL/min
GFR, Est African American: 69 mL/min
GLUCOSE: 94 mg/dL (ref 70–99)
Potassium: 5.1 mEq/L (ref 3.5–5.3)
Sodium: 143 mEq/L (ref 135–145)
Total Protein: 6.7 g/dL (ref 6.0–8.3)

## 2014-01-30 LAB — LIPID PANEL
Cholesterol: 201 mg/dL — ABNORMAL HIGH (ref 0–200)
HDL: 67 mg/dL (ref >39)
LDL Cholesterol: 110 mg/dL — ABNORMAL HIGH (ref 0–99)
TRIGLYCERIDES: 120 mg/dL (ref <150)
Total CHOL/HDL Ratio: 3 Ratio
VLDL: 24 mg/dL (ref 0–40)

## 2014-01-30 LAB — TSH: TSH: 2.581 u[IU]/mL (ref 0.350–4.500)

## 2014-01-31 LAB — VITAMIN D 25 HYDROXY (VIT D DEFICIENCY, FRACTURES): Vit D, 25-Hydroxy: 61 ng/mL (ref 30–100)

## 2014-02-04 ENCOUNTER — Encounter: Payer: Self-pay | Admitting: Internal Medicine

## 2014-02-04 ENCOUNTER — Ambulatory Visit (INDEPENDENT_AMBULATORY_CARE_PROVIDER_SITE_OTHER): Payer: 59 | Admitting: Internal Medicine

## 2014-02-04 VITALS — BP 122/71 | HR 74 | Resp 16 | Ht 64.5 in | Wt 152.0 lb

## 2014-02-04 DIAGNOSIS — R911 Solitary pulmonary nodule: Secondary | ICD-10-CM

## 2014-02-04 DIAGNOSIS — R0600 Dyspnea, unspecified: Secondary | ICD-10-CM

## 2014-02-04 DIAGNOSIS — Z0000 Encounter for general adult medical examination without abnormal findings: Secondary | ICD-10-CM

## 2014-02-04 DIAGNOSIS — Z23 Encounter for immunization: Secondary | ICD-10-CM

## 2014-02-04 LAB — POCT URINALYSIS DIPSTICK
BILIRUBIN UA: NEGATIVE
Blood, UA: NEGATIVE
GLUCOSE UA: NEGATIVE
KETONES UA: NEGATIVE
Leukocytes, UA: NEGATIVE
NITRITE UA: NEGATIVE
Protein, UA: NEGATIVE
Spec Grav, UA: 1.01
Urobilinogen, UA: 0.2
pH, UA: 6.5

## 2014-02-04 NOTE — Patient Instructions (Signed)
See me as needed 

## 2014-02-04 NOTE — Progress Notes (Signed)
Subjective:    Patient ID: Maria Kane, female    DOB: 12/02/1955, 58 y.o.   MRN: [id number]  HPI 11/19 pulmonary note  Dyspnea on exertion - Maria Noel, MD at 01/08/2014 2:43 PM     Status: Written Related Problem: Dyspnea on exertion   Expand All Collapse All   Unclear cause -no evidence of interstitial lung disease on imaging, does not seem to be asthma-exercise-induced , could be deconditioning or simply related to allergies. No reason to suspect VTE. Graduated exercise for now              Solitary pulmonary nodule - Maria Noel, MD at 01/08/2014 2:44 PM     Status: Written Related Problem: Solitary pulmonary nodule   Expand All Collapse All   59m FU CT in march 2016       TODAY  Maria Kane is here for CPE  HM:  Pt is a non-smoker,  UTD with vaccines,  S/P hysterectomy with BSO for endometriosis,  Colonoscopy 2010 (repeat 10 years) mm scheduled for 12/18   Maria Kane still describes DOE .  She has had extensive cardiac and pulmonary work up with no clear etiology found .  See above eval   She has lung nodule noted on CT done for CAC scoring.  DR. Elsworth Kane will be repeating this in March .  NO FH lung CA  Smoked  "a few cigarettes" in early adolescence.   She is tapering off HT and flushing at night    Allergies  Allergen Reactions  . Anesthetics, Amide     Has trouble waking up  . Clarithromycin Nausea And Vomiting   Past Medical History  Diagnosis Date  . Allergic rhinitis     dogs, dust, tree and ragweed  . Osteopenia     dexa 11/09 -1.4 spine, -1.2 hip  . Primiparous   . History of chickenpox   . Single kidney     left removed cong UPJ obstruction  . Pineal gland cyst     incidental finding  . Heart palpitations 10/13/2013   Past Surgical History  Procedure Laterality Date  . Appendectomy  2006  . Cholecystectomy  2001  . Abdominal hysterectomy  1996    age 62 total severe endometriosis  . Tonsillectomy  1965  . Left kidney  2002   removed congential UPJ obstruction    History   Social History  . Marital Status: Married    Spouse Name: N/A    Number of Children: N/A  . Years of Education: N/A   Occupational History  . Business Psychologist    Social History Main Topics  . Smoking status: Never Smoker   . Smokeless tobacco: Not on file  . Alcohol Use: 5.4 oz/week    9 Glasses of wine per week  . Drug Use: No  . Sexual Activity: Yes    Birth Control/ Protection: Surgical   Other Topics Concern  . Not on file   Social History Narrative   1/2 brother- schizopherenia   Occupation: Ph.D- Publishing rights manager   Married   [place] of 4   Regular exercise- yes   2 adopted kids 2 dogs   Originally from [place]   Family History  Problem Relation Age of Onset  . Breast cancer Mother   . Osteoarthritis Mother   . Multiple sclerosis Mother   . Hypertension Father   . Lung cancer Father   . Stroke Father   . Heart disease Father  pacemaker  . Diabetes Sister   . Hypertension Sister   . Polymyositis Sister   . Diabetes Brother   . Hypertension Brother    Patient Active Problem List   Diagnosis Date Noted  . Solitary pulmonary nodule 12/25/2013  . DOE (dyspnea on exertion) 11/27/2013  . Dyspnea on exertion 10/16/2013  . Palpitation 10/16/2013  . Hyperlipidemia 10/16/2013  . Heart palpitations 10/13/2013  . Chest pain 10/12/2013  . Breast pain, left 07/25/2012  . Hemorrhoid 07/25/2012  . Insomnia 02/08/2012  . Family history of breast cancer in mother 02/08/2012  . Family history of melanoma 02/08/2012  . Physical exam, annual 10/17/2011  . Knee pain 03/24/2011  . Elbow pain 03/24/2011  . Breast lump on left side at 6 o'clock position 02/01/2011  . Breast pain in female 02/01/2011  . Left arm pain 02/01/2011  . Single kidney   . Menopausal and perimenopausal disorder 09/28/2010  . Premature surgical menopause on hormone replacement therapy 09/28/2010  . Personal history of endometriosis  09/28/2010  . Allergic rhinitis   . HYPERPOTASSEMIA 01/05/2010  . NUMBNESS 01/05/2010  . LIBIDO, DECREASED 01/05/2010  . PLANTAR FASCIITIS, LEFT 03/19/2008  . CAVUS DEFORMITY OF FOOT, ACQUIRED 03/19/2008  . ALLERGIC RHINITIS 01/28/2008  . Pain in Soft Tissues of Limb 01/28/2008  . OSTEOPENIA 01/28/2008   Current Outpatient Prescriptions on File Prior to Visit  Medication Sig Dispense Refill  . aspirin EC 81 MG EC tablet Take 1 tablet (81 mg total) by mouth daily.    Marland Kitchen LORazepam (ATIVAN) 0.5 MG tablet Take 1 tablet (0.5 mg total) by mouth 2 (two) times a week. 10 tablet 1  . S-Adenosylmethionine (SAM-E PO) Take by mouth. 1600 mg daily      No current facility-administered medications on file prior to visit.       Review of Systems  Respiratory: Negative for cough, chest tightness, shortness of breath and wheezing.   Cardiovascular: Negative for chest pain, palpitations and leg swelling.  Gastrointestinal: Negative for abdominal pain.       Objective:   Physical Exam Physical Exam  Nursing note and vitals reviewed.  Constitutional: She is oriented to person, place, and time. She appears well-developed and well-nourished.  HENT:  Head: Normocephalic and atraumatic.  Right Ear: Tympanic membrane and ear canal normal. No drainage. Tympanic membrane is not injected and not erythematous.  Left Ear: Tympanic membrane and ear canal normal. No drainage. Tympanic membrane is not injected and not erythematous.  Nose: Nose normal. Right sinus exhibits no maxillary sinus tenderness and no frontal sinus tenderness. Left sinus exhibits no maxillary sinus tenderness and no frontal sinus tenderness.  Mouth/Throat: Oropharynx is clear and moist. No oral lesions. No oropharyngeal exudate.  Eyes: Conjunctivae and EOM are normal. Pupils are equal, round, and reactive to light.  Neck: Normal range of motion. Neck supple. No JVD present. Carotid bruit is not present. No mass and no thyromegaly  present.  Cardiovascular: Normal rate, regular rhythm, S1 normal, S2 normal and intact distal pulses. Exam reveals no gallop and no friction rub.  No murmur heard.  Pulses:  Carotid pulses are 2+ on the right side, and 2+ on the left side.  Dorsalis pedis pulses are 2+ on the right side, and 2+ on the left side.  No carotid bruit. No LE edema  Pulmonary/Chest: Breath sounds normal. She has no wheezes. She has no rales. She exhibits no tenderness.   Breast no discrete masses no nipple discharge no axillary adenopathy bilaterally  Abdominal: Soft. Bowel sounds are normal. She exhibits no distension and no mass. There is no hepatosplenomegaly. There is no tenderness. There is no CVA tenderness.  Rectal no mass guaiac neg.  Musculoskeletal: Normal range of motion.  No active synovitis to joints.  Lymphadenopathy:  She has no cervical adenopathy.  She has no axillary adenopathy.  Right: No inguinal and no supraclavicular adenopathy present.  Left: No inguinal and no supraclavicular adenopathy present.  Neurological: She is alert and oriented to person, place, and time. She has normal strength and normal reflexes. She displays no tremor. No cranial nerve deficit or sensory deficit. Coordination and gait normal.  Skin: Skin is warm and dry. No rash noted. No cyanosis. Nails show no clubbing.  Psychiatric: She has a normal mood and affect. Her speech is normal and behavior is normal. Cognition and memory are normal.           Assessment & Plan:  HM  TDap today  wil order  Zostavax     Pt advised mm,  She is a non-smoker S/P hysterectomy   Lung nodule managed by Dr.Alva  Pt is aware she will need repeat  CT in March 2016  DOE  Extensive work up unrevealing for etiology  Ok to exercise as tolerated  HT withdrawal flushing :  Ok to try OTC genistein and cooling pillow   She does not want RX meds

## 2014-02-05 ENCOUNTER — Encounter: Payer: Self-pay | Admitting: *Deleted

## 2014-02-06 LAB — HM MAMMOGRAPHY

## 2014-02-11 ENCOUNTER — Encounter: Payer: Self-pay | Admitting: *Deleted

## 2014-03-06 ENCOUNTER — Ambulatory Visit: Payer: 59

## 2014-03-20 ENCOUNTER — Ambulatory Visit: Payer: 59

## 2014-05-06 ENCOUNTER — Ambulatory Visit (HOSPITAL_BASED_OUTPATIENT_CLINIC_OR_DEPARTMENT_OTHER): Payer: 59

## 2014-05-08 ENCOUNTER — Other Ambulatory Visit: Payer: Self-pay | Admitting: Internal Medicine

## 2014-05-11 NOTE — Telephone Encounter (Signed)
RX called in .

## 2014-05-11 NOTE — Telephone Encounter (Signed)
Refill request

## 2014-06-05 ENCOUNTER — Telehealth: Payer: Self-pay | Admitting: Pulmonary Disease

## 2014-06-05 NOTE — Telephone Encounter (Signed)
Pt is on recall list for RA for March appt & states "after CT". Does CT need to be scheduled? Looks like it was cancelled//spm

## 2014-06-05 NOTE — Telephone Encounter (Signed)
Noted  

## 2014-06-05 NOTE — Telephone Encounter (Signed)
Called pt to schedule appt & she states does not want to schedule at this. She states RA did not know what was making her feel the way she was feeling & he talked to her about conditioning. She states she is working on this now & she will call us back when she is ready to schedule appt.

## 2014-06-05 NOTE — Telephone Encounter (Signed)
The CT was canceled by the pt. You can go ahead and schedule ROV. RA can speak to her about this at her visit.

## 2014-06-06 ENCOUNTER — Emergency Department
Admission: EM | Admit: 2014-06-06 | Discharge: 2014-06-06 | Disposition: A | Discharge status: Home / Self Care | Payer: 59 | Source: Home / Self Care | Attending: Family Medicine | Admitting: Family Medicine

## 2014-06-06 ENCOUNTER — Encounter: Payer: Self-pay | Admitting: Emergency Medicine

## 2014-06-06 DIAGNOSIS — S01511A Laceration without foreign body of lip, initial encounter: Secondary | ICD-10-CM | POA: Diagnosis not present

## 2014-06-06 NOTE — ED Provider Notes (Signed)
CSN: [id number]     Arrival date & time 06/06/14  1501 History   First MD Initiated Contact with Patient 06/06/14 1551     Chief Complaint  Patient presents with  . Lip Laceration      HPI Comments: While doing yard work two hours ago, a branch snapped back lacerating her lower lip.  No other injury.  She states that her last Tdap was within the last 6 years.  Patient is a 59 y.o. female presenting with skin laceration. The history is provided by the patient.  Laceration Location:  Mouth Mouth laceration location:  Lower outer lip Length (cm):  0.8 Depth:  Cutaneous Quality: stellate   Bleeding: controlled   Time since incident:  2 hours Injury mechanism: tree branck. Foreign body present:  No foreign bodies Relieved by:  Pressure Worsened by:  Nothing tried Tetanus status:  Up to date   Past Medical History  Diagnosis Date  . Allergic rhinitis     dogs, dust, tree and ragweed  . Osteopenia     dexa 11/09 -1.4 spine, -1.2 hip  . Primiparous   . History of chickenpox   . Single kidney     left removed cong UPJ obstruction  . Pineal gland cyst     incidental finding  . Heart palpitations 10/13/2013   Past Surgical History  Procedure Laterality Date  . Appendectomy  2006  . Cholecystectomy  2001  . Abdominal hysterectomy  1996    age 61 total severe endometriosis  . Tonsillectomy  1965  . Left kidney  2002    removed congential UPJ obstruction    Family History  Problem Relation Age of Onset  . Breast cancer Mother   . Osteoarthritis Mother   . Multiple sclerosis Mother   . Hypertension Father   . Lung cancer Father   . Stroke Father   . Heart disease Father     pacemaker  . Diabetes Sister   . Hypertension Sister   . Polymyositis Sister   . Diabetes Brother   . Hypertension Brother    History  Substance Use Topics  . Smoking status: Never Smoker   . Smokeless tobacco: Not on file  . Alcohol Use: 5.4 oz/week    9 Glasses of wine per week   OB  History    No data available     Review of Systems  All other systems reviewed and are negative.   Allergies  Anesthetics, amide and Clarithromycin  Home Medications   Prior to Admission medications   Medication Sig Start Date End Date Taking? Authorizing Provider  aspirin EC 81 MG EC tablet Take 1 tablet (81 mg total) by mouth daily. 10/13/13   Almyra Deforest, PA  LORazepam (ATIVAN) 0.5 MG tablet TAKE 1 TABLET BY MOUTH TWICE A WEEK 05/11/14   Lanice Shirts, MD  S-Adenosylmethionine (SAM-E PO) Take by mouth. 1600 mg daily     Historical Provider, MD   BP 132/79 mmHg  Pulse 74  Temp(Src) 97.8 F (36.6 C) (Tympanic)  Resp 16  Ht 5' 4.5" (1.638 m)  Wt 145 lb (65.772 kg)  BMI 24.51 kg/m2  SpO2 100% Physical Exam  Constitutional: She appears well-developed and well-nourished. No distress.  HENT:  Nose: Nose normal.  Mouth/Throat: Oropharynx is clear and moist.    Approximately 83mm superficial flap laceration on lower lip as noted on diagram.  No internal mouth injuries noted.    Eyes: Conjunctivae are normal. Pupils are equal, round,  and reactive to light.  Neck: Normal range of motion.  Nursing note and vitals reviewed.   ED Course  Procedures  Laceration Repair (Dermabond) Discussed benefits and risks of procedure and verbal consent obtained. Using sterile technique, cleansed wound with HibiClens followed by lavage with normal saline.  Wound carefully inspected for debris and foreign bodies; none found.  Wound edges carefully approximated in normal anatomic position and closed with Dermabond.  Wound precautions explained to patient.     MDM   1. Laceration of lip, initial encounter     Keep wound clean and dry.  Follow instructions on Dermabond information sheet.  Return for signs of infection:  Redness, increased swelling, pain, etc. Apply ice pack for 10 minutes minutes, every 2 to 3 hours until swelling decreases.       Kandra Nicolas, MD 06/13/14 [phone]

## 2014-06-06 NOTE — Discharge Instructions (Signed)
Keep wound clean and dry.  Follow instructions on Dermabond information sheet.  Return for signs of infection:  Redness, increased swelling, pain, etc. Apply ice pack for 10 minutes minutes, every 2 to 3 hours until swelling decreases.

## 2014-06-06 NOTE — ED Notes (Signed)
Reports tree branch snapped back while doing yard work and lacerated lower lip left of midline; states has had tetanus immunization within past 6 years; bleeding under control with ice pack in place.

## 2014-08-18 ENCOUNTER — Encounter: Payer: Self-pay | Admitting: Internal Medicine

## 2014-08-18 ENCOUNTER — Ambulatory Visit (INDEPENDENT_AMBULATORY_CARE_PROVIDER_SITE_OTHER): Payer: 59 | Admitting: Internal Medicine

## 2014-08-18 VITALS — BP 122/82 | HR 99 | Temp 98.6°F | Resp 16 | Ht 65.0 in | Wt 149.0 lb

## 2014-08-18 DIAGNOSIS — N952 Postmenopausal atrophic vaginitis: Secondary | ICD-10-CM | POA: Diagnosis not present

## 2014-08-18 DIAGNOSIS — K649 Unspecified hemorrhoids: Secondary | ICD-10-CM | POA: Diagnosis not present

## 2014-08-18 DIAGNOSIS — R0609 Other forms of dyspnea: Secondary | ICD-10-CM | POA: Diagnosis not present

## 2014-08-18 MED ORDER — HYDROCORTISONE ACETATE 25 MG RE SUPP: 25.0000 mg | Freq: Two times a day (BID) | RECTAL | Status: DC | PRN

## 2014-08-18 MED ORDER — ESTROGENS, CONJUGATED 0.625 MG/GM VA CREA: 1.0000 | TOPICAL_CREAM | Freq: Every day | VAGINAL | Status: DC

## 2014-08-18 NOTE — Patient Instructions (Signed)
We are sending in two creams. One is for the hemorrhoids that you should use twice a day for 2-3 weeks then as needed for hemorrhoids. Let us know if it is not working. It is called anu-sol or hydrocortisone.   The other cream goes in the vagina and is estrogen cream (premarin). Use 1 application daily for 2 weeks then 1 application 3 times a week. If you are doing well you can go down to 1-2 times per week. This will likely not help with the hot flashes but will help make the vaginal tissue healthy and happy again.

## 2014-08-18 NOTE — Progress Notes (Signed)
Pre visit review using our clinic review tool, if applicable. No additional management support is needed unless otherwise documented below in the visit note. 

## 2014-08-21 ENCOUNTER — Encounter: Payer: Self-pay | Admitting: Gastroenterology

## 2014-08-22 ENCOUNTER — Encounter: Payer: Self-pay | Admitting: Internal Medicine

## 2014-08-22 NOTE — Assessment & Plan Note (Signed)
Anusol suppositories for her hemorrhoids. If no response will refer to GI for evaluation for banding. Advised her that she should avoid constipation so as to avoid worsening.

## 2014-08-22 NOTE — Progress Notes (Signed)
   Subjective:    Patient ID: Maria Kane, female    DOB: [birth year], 59 y.o.   MRN: [id number]  HPI The patient is a new 59 YO female coming in with several acute complaints. She has a lot of trouble with hemorrhoids and has for some time. She has never sought care for them. Tried preparation H and had reaction to it with jitteriness (she thinks was phenylephrine or similar). She denies significant constipation or diarrhea. Some blood with wiping. Very troublesome and worsening lately.  Next problem is vaginal dryness and pain with intercourse. She had been on estrogen patch but had decided to stop. This was several months ago and the pain has been present since then. She is also having mild hot flashes since stopping the patch. She rates that as a mild problem. Was concerned about the small increase in breast cancer as her mother had a hormone sensitive breast cancer.   PMH, [place], social history reviewed and updated  Review of Systems  Constitutional: Negative for fever, activity change, appetite change and fatigue.  HENT: Negative.   Eyes: Negative.   Respiratory: Negative.   Cardiovascular: Negative.   Gastrointestinal: Positive for anal bleeding. Negative for nausea, abdominal pain, diarrhea, constipation, blood in stool and abdominal distention.  Genitourinary: Positive for dyspareunia. Negative for dysuria, frequency, flank pain, decreased urine volume, vaginal bleeding, vaginal discharge and vaginal pain.  Musculoskeletal: Negative.   Skin: Negative.   Neurological: Negative.   Psychiatric/Behavioral: Negative.       Objective:   Physical Exam  Constitutional: She is oriented to person, place, and time. She appears well-developed and well-nourished.  HENT:  Head: Normocephalic and atraumatic.  Eyes: EOM are normal.  Neck: Normal range of motion.  Cardiovascular: Normal rate and regular rhythm.   Pulmonary/Chest: Effort normal and breath sounds normal.  Abdominal: Soft.  Bowel sounds are normal.  Musculoskeletal: She exhibits no edema.  Neurological: She is alert and oriented to person, place, and time.  Skin: Skin is warm and dry.  Psychiatric: She has a normal mood and affect.   Filed Vitals:   08/18/14 1318  BP: 122/82  Pulse: 99  Temp: 98.6 F (37 C)  TempSrc: Oral  Resp: 16  Height: 5\' 5"  (1.651 m)  Weight: 149 lb (67.586 kg)  SpO2: 99%      Assessment & Plan:

## 2014-08-22 NOTE — Assessment & Plan Note (Signed)
Did well on hormone replacement with estrogen patch but did not want to continue. Will try estrogen cream vaginally daily for 2 weeks then 3 times a week then she can decrease to minimum of once a week as tolerated.

## 2014-08-22 NOTE — Assessment & Plan Note (Signed)
Is resolved since she stopped using preparation H and she attributes this to that medicine.

## 2014-09-24 ENCOUNTER — Encounter: Payer: Self-pay | Admitting: *Deleted

## 2014-11-11 ENCOUNTER — Telehealth: Payer: Self-pay | Admitting: Pulmonary Disease

## 2014-11-11 NOTE — Telephone Encounter (Signed)
Pt returned call  [phone]

## 2014-11-11 NOTE — Telephone Encounter (Signed)
The patient is aware her CT is Scheduled at [facility] on 12/02/2014 @ 11:30am

## 2014-11-11 NOTE — Telephone Encounter (Signed)
Spoke with the pt and advised the ct will be done without contrast  She verbalized understanding  This was supposed to be done in April 2016 but she had been "putting this off" Now she states that she would like to schedule  Order already placed  Will forward to [facility] so that are aware she is ready to schedule thanks

## 2014-11-11 NOTE — Telephone Encounter (Signed)
The CT Chest was order w/o CM  LMTCB

## 2014-11-24 ENCOUNTER — Ambulatory Visit (INDEPENDENT_AMBULATORY_CARE_PROVIDER_SITE_OTHER): Payer: 59 | Admitting: Internal Medicine

## 2014-11-24 ENCOUNTER — Encounter: Payer: Self-pay | Admitting: Internal Medicine

## 2014-11-24 ENCOUNTER — Telehealth: Payer: Self-pay | Admitting: Internal Medicine

## 2014-11-24 VITALS — BP 116/80 | HR 83 | Temp 97.7°F | Ht 65.0 in | Wt 153.8 lb

## 2014-11-24 DIAGNOSIS — Z23 Encounter for immunization: Secondary | ICD-10-CM | POA: Diagnosis not present

## 2014-11-24 DIAGNOSIS — M25561 Pain in right knee: Secondary | ICD-10-CM | POA: Diagnosis not present

## 2014-11-24 DIAGNOSIS — G4700 Insomnia, unspecified: Secondary | ICD-10-CM

## 2014-11-24 MED ORDER — LORAZEPAM 0.5 MG PO TABS: ORAL_TABLET | ORAL | Status: DC

## 2014-11-24 NOTE — Patient Instructions (Addendum)
You received the flu vaccine today.   Medications reviewed and updated.  No changes recommended at this time.  Your prescription(s) have been submitted to your pharmacy. Please take as directed and contact our office if you believe you are having problem(s) with the medication(s).  Please schedule a physical  - you are due for a physical after 02/04/15.

## 2014-11-24 NOTE — Progress Notes (Signed)
   Subjective:    Patient ID: Maria Kane, female    DOB: [birth year], 57 y.o.   MRN: [id number]  HPI She is here for an acute visit for right foot pain.  The pain started two weeks ago and was intermittent.  The pain started in the right third toe and radiated up to her thigh.  The pain was stabbing in nature and only lasted for one second.  She denies any cause or pattern for the pain.  She would have the pain with walking or sitting.  She denies any injuries or new activities that may have caused it.  She has not had the pain for the past four days and thinks it has resolved.  She denied any numbness, tingling, muscle cramping in the leg and has not had any back pain.    She exercises regularly - usually speed walks 45 minutes to 1 hr a day.    Insomnia:  She has insomnia related to hot flashes.  She follows with gyn.  She was on estrogen, but has been taken off.  She deals with the hot flashes and does not want any other medication for it. She has difficulty sleeping and on occasion takes ativan to help her sleep.  She typically only takes it 2-4 times a month.  She would like a refill.    Medications and allergies reviewed with patient and updated if appropriate.  Review of Systems  Cardiovascular: Negative for leg swelling.  Musculoskeletal: Negative for back pain and arthralgias.  Neurological: Negative for weakness and numbness.       Objective:   Physical Exam  Constitutional: She appears well-developed and well-nourished.  Musculoskeletal:  Normal range of motion of right ankle and toes, no edema or swelling, no skin changes, no tenderness in foot, ankle or calf.   Skin: No rash noted. No erythema.      Filed Vitals:   11/24/14 1308  BP: 116/80  Pulse: 83  Temp: 97.7 F (36.5 C)       Assessment & Plan:   Flu shot given today  Right foot/leg pain: Resolved and currently asymptomatic Exam is normal Call if returns  Insomnia, related to hot flashes Was on  estrogen, taken off by gyn Continue ativan only as needed - 2-4 times a month  Will schedule a PE for later this year or early next year

## 2014-11-24 NOTE — Telephone Encounter (Signed)
Called pharm and indicated that sig was correct. Lorazepam 1-2 tablet per month.

## 2014-11-24 NOTE — Telephone Encounter (Signed)
[place] called needing clarification on directions for LORazepam (ATIVAN) 0.5 MG tablet [[id number]

## 2014-11-24 NOTE — Progress Notes (Signed)
Pre visit review using our clinic review tool, if applicable. No additional management support is needed unless otherwise documented below in the visit note. 

## 2014-12-02 ENCOUNTER — Other Ambulatory Visit (HOSPITAL_BASED_OUTPATIENT_CLINIC_OR_DEPARTMENT_OTHER): Payer: 59

## 2014-12-11 ENCOUNTER — Ambulatory Visit (HOSPITAL_BASED_OUTPATIENT_CLINIC_OR_DEPARTMENT_OTHER)
Admission: RE | Admit: 2014-12-11 | Discharge: 2014-12-11 | Disposition: A | Discharge status: Home / Self Care | Payer: 59 | Source: Ambulatory Visit | Attending: Pulmonary Disease | Admitting: Pulmonary Disease

## 2014-12-11 DIAGNOSIS — R911 Solitary pulmonary nodule: Secondary | ICD-10-CM | POA: Diagnosis present

## 2014-12-15 ENCOUNTER — Telehealth: Payer: Self-pay | Admitting: *Deleted

## 2014-12-15 NOTE — Telephone Encounter (Signed)
-----   Message from Rigoberto Noel, MD sent at 12/14/2014  4:36 PM EDT ----- Regarding: RE: follow up Next available FU with TP ok ----- Message -----    From: Glean Hess, CMA    Sent: 12/14/2014   2:58 PM      To: Rigoberto Noel, MD Subject: follow up                                      Pt last seen 12/2013; OV note states "4 months after CT Scan".  CT scan results show benign.  Do you need pt to come back for F/U?  If so, when does she need to come back?

## 2014-12-15 NOTE — Telephone Encounter (Signed)
Pt needs follow up appt with TP Left message on pt's vm advising pt to call to schedule f/u appt.

## 2014-12-16 NOTE — Telephone Encounter (Signed)
lmtcb for pt.  

## 2014-12-18 NOTE — Telephone Encounter (Signed)
lmomtcb x1 

## 2014-12-21 NOTE — Telephone Encounter (Signed)
lmomtcb for pt 

## 2014-12-21 NOTE — Telephone Encounter (Signed)
Unable to reach letter mailed to patient. If patient calls back, can schedule follow up appointment with Rexene Edison, NP. Nothing further needed. Closing encounter

## 2015-02-08 ENCOUNTER — Encounter: Payer: 59 | Admitting: Internal Medicine

## 2015-02-17 ENCOUNTER — Encounter: Payer: Self-pay | Admitting: Internal Medicine

## 2015-02-17 ENCOUNTER — Ambulatory Visit (INDEPENDENT_AMBULATORY_CARE_PROVIDER_SITE_OTHER): Payer: 59 | Admitting: Internal Medicine

## 2015-02-17 ENCOUNTER — Other Ambulatory Visit (INDEPENDENT_AMBULATORY_CARE_PROVIDER_SITE_OTHER): Payer: 59

## 2015-02-17 VITALS — BP 130/86 | HR 69 | Temp 98.5°F | Resp 16 | Wt 155.0 lb

## 2015-02-17 DIAGNOSIS — R7989 Other specified abnormal findings of blood chemistry: Secondary | ICD-10-CM

## 2015-02-17 DIAGNOSIS — E2740 Unspecified adrenocortical insufficiency: Secondary | ICD-10-CM | POA: Diagnosis not present

## 2015-02-17 DIAGNOSIS — Z23 Encounter for immunization: Secondary | ICD-10-CM

## 2015-02-17 DIAGNOSIS — Z0000 Encounter for general adult medical examination without abnormal findings: Secondary | ICD-10-CM

## 2015-02-17 LAB — LIPID PANEL
CHOLESTEROL: 207 mg/dL — AB (ref 0–200)
HDL: 66.3 mg/dL (ref >39.00)
NonHDL: 140.34
Total CHOL/HDL Ratio: 3
Triglycerides: 219 mg/dL — ABNORMAL HIGH (ref 0.0–149.0)
VLDL: 43.8 mg/dL — ABNORMAL HIGH (ref 0.0–40.0)

## 2015-02-17 LAB — COMPREHENSIVE METABOLIC PANEL
ALK PHOS: 89 U/L (ref 39–117)
ALT: 28 U/L (ref 0–35)
AST: 23 U/L (ref 0–37)
Albumin: 4.5 g/dL (ref 3.5–5.2)
BILIRUBIN TOTAL: 0.4 mg/dL (ref 0.2–1.2)
BUN: 16 mg/dL (ref 6–23)
CHLORIDE: 105 meq/L (ref 96–112)
CO2: 26 meq/L (ref 19–32)
Calcium: 10.2 mg/dL (ref 8.4–10.5)
Creatinine, Ser: 0.96 mg/dL (ref 0.40–1.20)
GFR: 63.46 mL/min (ref >60.00)
GLUCOSE: 102 mg/dL — AB (ref 70–99)
POTASSIUM: 4.9 meq/L (ref 3.5–5.1)
SODIUM: 141 meq/L (ref 135–145)
Total Protein: 7.4 g/dL (ref 6.0–8.3)

## 2015-02-17 LAB — CBC WITH DIFFERENTIAL/PLATELET
BASOS PCT: 0.8 % (ref 0.0–3.0)
Basophils Absolute: 0.1 10*3/uL (ref 0.0–0.1)
EOS PCT: 4.4 % (ref 0.0–5.0)
Eosinophils Absolute: 0.3 10*3/uL (ref 0.0–0.7)
HCT: 42.7 % (ref 36.0–46.0)
Hemoglobin: 14.2 g/dL (ref 12.0–15.0)
LYMPHS ABS: 2.7 10*3/uL (ref 0.7–4.0)
Lymphocytes Relative: 36.1 % (ref 12.0–46.0)
MCHC: 33.1 g/dL (ref 30.0–36.0)
MCV: 97 fl (ref 78.0–100.0)
MONO ABS: 0.6 10*3/uL (ref 0.1–1.0)
MONOS PCT: 7.5 % (ref 3.0–12.0)
NEUTROS ABS: 3.8 10*3/uL (ref 1.4–7.7)
NEUTROS PCT: 51.2 % (ref 43.0–77.0)
PLATELETS: 263 10*3/uL (ref 150.0–400.0)
RBC: 4.41 Mil/uL (ref 3.87–5.11)
RDW: 13.9 % (ref 11.5–15.5)
WBC: 7.5 10*3/uL (ref 4.0–10.5)

## 2015-02-17 LAB — LDL CHOLESTEROL, DIRECT: LDL DIRECT: 114 mg/dL

## 2015-02-17 LAB — CORTISOL: CORTISOL PLASMA: 10 ug/dL

## 2015-02-17 LAB — HEPATITIS C ANTIBODY: HCV Ab: NEGATIVE

## 2015-02-17 LAB — TSH: TSH: 3.29 u[IU]/mL (ref 0.35–4.50)

## 2015-02-17 MED ORDER — TRAZODONE HCL 50 MG PO TABS: 50.0000 mg | ORAL_TABLET | Freq: Every evening | ORAL | Status: DC | PRN

## 2015-02-17 NOTE — Patient Instructions (Signed)
We have reviewed your prior records including labs and tests today.  Test(s) ordered today. Your results will be released to [place] (or called to you) after review, usually within 72hours after test completion. If any changes need to be made, you will be notified at that same time.  All other Health Maintenance issues reviewed.   All recommended immunizations and age-appropriate screenings are up-to-date.  Shingles vaccine administered today.   Medications reviewed and updated.  Changes include trying trazodone for your sleep. .  Your prescription(s) have been submitted to your pharmacy. Please take as directed and contact our office if you believe you are having problem(s) with the medication(s).  A referral for endocine  Health Maintenance, Female Adopting a healthy lifestyle and getting preventive care can go a long way to promote health and wellness. Talk with your health care provider about what schedule of regular examinations is right for you. This is a good chance for you to check in with your provider about disease prevention and staying healthy. In between checkups, there are plenty of things you can do on your own. Experts have done a lot of research about which lifestyle changes and preventive measures are most likely to keep you healthy. Ask your health care provider for more information. WEIGHT AND DIET  Eat a healthy diet  Be sure to include plenty of vegetables, fruits, low-fat dairy products, and lean protein.  Do not eat a lot of foods high in solid fats, added sugars, or salt.  Get regular exercise. This is one of the most important things you can do for your health.  Most adults should exercise for at least 150 minutes each week. The exercise should increase your heart rate and make you sweat (moderate-intensity exercise).  Most adults should also do strengthening exercises at least twice a week. This is in addition to the moderate-intensity exercise.  Maintain a  healthy weight  Body mass index (BMI) is a measurement that can be used to identify possible weight problems. It estimates body fat based on height and weight. Your health care provider can help determine your BMI and help you achieve or maintain a healthy weight.  For females 50 years of age and older:   A BMI below 18.5 is considered underweight.  A BMI of 18.5 to 24.9 is normal.  A BMI of 25 to 29.9 is considered overweight.  A BMI of 30 and above is considered obese.  Watch levels of cholesterol and blood lipids  You should start having your blood tested for lipids and cholesterol at 57 years of age, then have this test every 5 years.  You may need to have your cholesterol levels checked more often if:  Your lipid or cholesterol levels are high.  You are older than 57 years of age.  You are at high risk for heart disease.  CANCER SCREENING   Lung Cancer  Lung cancer screening is recommended for adults 77-13 years old who are at high risk for lung cancer because of a history of smoking.  A yearly low-dose CT scan of the lungs is recommended for people who:  Currently smoke.  Have quit within the past 15 years.  Have at least a 30-pack-year history of smoking. A pack year is smoking an average of one pack of cigarettes a day for 1 year.  Yearly screening should continue until it has been 15 years since you quit.  Yearly screening should stop if you develop a health problem that would prevent  you from having lung cancer treatment.  Breast Cancer  Practice breast self-awareness. This means understanding how your breasts normally appear and feel.  It also means doing regular breast self-exams. Let your health care provider know about any changes, no matter how small.  If you are in your 20s or 30s, you should have a clinical breast exam (CBE) by a health care provider every 1-3 years as part of a regular health exam.  If you are 40 or older, have a CBE every year.  Also consider having a breast X-ray (mammogram) every year.  If you have a family history of breast cancer, talk to your health care provider about genetic screening.  If you are at high risk for breast cancer, talk to your health care provider about having an MRI and a mammogram every year.  Breast cancer gene (BRCA) assessment is recommended for women who have family members with BRCA-related cancers. BRCA-related cancers include:  Breast.  Ovarian.  Tubal.  Peritoneal cancers.  Results of the assessment will determine the need for genetic counseling and BRCA1 and BRCA2 testing. Cervical Cancer Your health care provider may recommend that you be screened regularly for cancer of the pelvic organs (ovaries, uterus, and vagina). This screening involves a pelvic examination, including checking for microscopic changes to the surface of your cervix (Pap test). You may be encouraged to have this screening done every 3 years, beginning at age 49.  For women ages 24-65, health care providers may recommend pelvic exams and Pap testing every 3 years, or they may recommend the Pap and pelvic exam, combined with testing for human papilloma virus (HPV), every 5 years. Some types of HPV increase your risk of cervical cancer. Testing for HPV may also be done on women of any age with unclear Pap test results.  Other health care providers may not recommend any screening for nonpregnant women who are considered low risk for pelvic cancer and who do not have symptoms. Ask your health care provider if a screening pelvic exam is right for you.  If you have had past treatment for cervical cancer or a condition that could lead to cancer, you need Pap tests and screening for cancer for at least 20 years after your treatment. If Pap tests have been discontinued, your risk factors (such as having a new sexual partner) need to be reassessed to determine if screening should resume. Some women have medical problems that  increase the chance of getting cervical cancer. In these cases, your health care provider may recommend more frequent screening and Pap tests. Colorectal Cancer  This type of cancer can be detected and often prevented.  Routine colorectal cancer screening usually begins at 57 years of age and continues through 57 years of age.  Your health care provider may recommend screening at an earlier age if you have risk factors for colon cancer.  Your health care provider may also recommend using home test kits to check for hidden blood in the stool.  A small camera at the end of a tube can be used to examine your colon directly (sigmoidoscopy or colonoscopy). This is done to check for the earliest forms of colorectal cancer.  Routine screening usually begins at age 69.  Direct examination of the colon should be repeated every 5-10 years through 57 years of age. However, you may need to be screened more often if early forms of precancerous polyps or small growths are found. Skin Cancer  Check your skin from head to  toe regularly.  Tell your health care provider about any new moles or changes in moles, especially if there is a change in a mole's shape or color.  Also tell your health care provider if you have a mole that is larger than the size of a pencil eraser.  Always use sunscreen. Apply sunscreen liberally and repeatedly throughout the day.  Protect yourself by wearing long sleeves, pants, a wide-brimmed hat, and sunglasses whenever you are outside. HEART DISEASE, DIABETES, AND HIGH BLOOD PRESSURE   High blood pressure causes heart disease and increases the risk of stroke. High blood pressure is more likely to develop in:  People who have blood pressure in the high end of the normal range (130-139/85-89 mm Hg).  People who are overweight or obese.  People who are African American.  If you are 52-30 years of age, have your blood pressure checked every 3-5 years. If you are 73 years of  age or older, have your blood pressure checked every year. You should have your blood pressure measured twice--once when you are at a hospital or clinic, and once when you are not at a hospital or clinic. Record the average of the two measurements. To check your blood pressure when you are not at a hospital or clinic, you can use:  An automated blood pressure machine at a pharmacy.  A home blood pressure monitor.  If you are between 90 years and 48 years old, ask your health care provider if you should take aspirin to prevent strokes.  Have regular diabetes screenings. This involves taking a blood sample to check your fasting blood sugar level.  If you are at a normal weight and have a low risk for diabetes, have this test once every three years after 57 years of age.  If you are overweight and have a high risk for diabetes, consider being tested at a younger age or more often. PREVENTING INFECTION  Hepatitis B  If you have a higher risk for hepatitis B, you should be screened for this virus. You are considered at high risk for hepatitis B if:  You were born in a country where hepatitis B is common. Ask your health care provider which countries are considered high risk.  Your parents were born in a high-risk country, and you have not been immunized against hepatitis B (hepatitis B vaccine).  You have HIV or AIDS.  You use needles to inject street drugs.  You live with someone who has hepatitis B.  You have had sex with someone who has hepatitis B.  You get hemodialysis treatment.  You take certain medicines for conditions, including cancer, organ transplantation, and autoimmune conditions. Hepatitis C  Blood testing is recommended for:  Everyone born from [birth year] through 1963.  Anyone with known risk factors for hepatitis C. Sexually transmitted infections (STIs)  You should be screened for sexually transmitted infections (STIs) including gonorrhea and chlamydia if:  You are  sexually active and are younger than 58 years of age.  You are older than 57 years of age and your health care provider tells you that you are at risk for this type of infection.  Your sexual activity has changed since you were last screened and you are at an increased risk for chlamydia or gonorrhea. Ask your health care provider if you are at risk.  If you do not have HIV, but are at risk, it may be recommended that you take a prescription medicine daily to prevent HIV infection. This  is called pre-exposure prophylaxis (PrEP). You are considered at risk if:  You are sexually active and do not regularly use condoms or know the HIV status of your partner(s).  You take drugs by injection.  You are sexually active with a partner who has HIV. Talk with your health care provider about whether you are at high risk of being infected with HIV. If you choose to begin PrEP, you should first be tested for HIV. You should then be tested every 3 months for as long as you are taking PrEP.  PREGNANCY   If you are premenopausal and you may become pregnant, ask your health care provider about preconception counseling.  If you may become pregnant, take 400 to 800 micrograms (mcg) of folic acid every day.  If you want to prevent pregnancy, talk to your health care provider about birth control (contraception). OSTEOPOROSIS AND MENOPAUSE   Osteoporosis is a disease in which the bones lose minerals and strength with aging. This can result in serious bone fractures. Your risk for osteoporosis can be identified using a bone density scan.  If you are 80 years of age or older, or if you are at risk for osteoporosis and fractures, ask your health care provider if you should be screened.  Ask your health care provider whether you should take a calcium or vitamin D supplement to lower your risk for osteoporosis.  Menopause may have certain physical symptoms and risks.  Hormone replacement therapy may reduce some  of these symptoms and risks. Talk to your health care provider about whether hormone replacement therapy is right for you.  HOME CARE INSTRUCTIONS   Schedule regular health, dental, and eye exams.  Stay current with your immunizations.   Do not use any tobacco products including cigarettes, chewing tobacco, or electronic cigarettes.  If you are pregnant, do not drink alcohol.  If you are breastfeeding, limit how much and how often you drink alcohol.  Limit alcohol intake to no more than 1 drink per day for nonpregnant women. One drink equals 12 ounces of beer, 5 ounces of wine, or 1 ounces of hard liquor.  Do not use street drugs.  Do not share needles.  Ask your health care provider for help if you need support or information about quitting drugs.  Tell your health care provider if you often feel depressed.  Tell your health care provider if you have ever been abused or do not feel safe at home.   This information is not intended to replace advice given to you by your health care provider. Make sure you discuss any questions you have with your health care provider.   Document Released: 08/22/2010 Document Revised: 02/27/2014 Document Reviewed: 01/08/2013 Elsevier Interactive Patient Education [employer].

## 2015-02-17 NOTE — Progress Notes (Signed)
Pre visit review using our clinic review tool, if applicable. No additional management support is needed unless otherwise documented below in the visit note. 

## 2015-02-17 NOTE — Progress Notes (Signed)
Subjective:    Patient ID: Maria Kane, female    DOB: December 02, 1955, 59 y.o.   MRN: [id number]  HPI She is here for a physical exam.  Insomnia:  Has tried several otc meds and they have not worked.  She has tried [place] and she had side effects.  She did try trazodone and does not recall what happened.    Medications and allergies reviewed with patient and updated if appropriate.  Patient Active Problem List   Diagnosis Date Noted  . Solitary pulmonary nodule 12/25/2013  . DOE (dyspnea on exertion) 11/27/2013  . Palpitation 10/16/2013  . Hyperlipidemia 10/16/2013  . Chest pain 10/12/2013  . Hemorrhoid 07/25/2012  . Insomnia 02/08/2012  . Knee pain 03/24/2011  . Breast lump on left side at 6 o'clock position 02/01/2011  . Single kidney   . Postmenopausal atrophic vaginitis 09/28/2010  . Allergic rhinitis   . NUMBNESS 01/05/2010  . OSTEOPENIA 01/28/2008    Current Outpatient Prescriptions on File Prior to Visit  Medication Sig Dispense Refill  . aspirin EC 81 MG EC tablet Take 1 tablet (81 mg total) by mouth daily.    . fexofenadine (ALLEGRA) 180 MG tablet Take 180 mg by mouth daily.    . hydrocortisone (ANUSOL-HC) 25 MG suppository Place 1 suppository (25 mg total) rectally 2 (two) times daily as needed for hemorrhoids or itching. 60 suppository 6  . LORazepam (ATIVAN) 0.5 MG tablet 1 tablet po once or twice a month 10 tablet 2  . S-Adenosylmethionine (SAM-E PO) Take 1,200 mg by mouth daily. 1600 mg daily     No current facility-administered medications on file prior to visit.    Past Medical History  Diagnosis Date  . Allergic rhinitis     dogs, dust, tree and ragweed  . Osteopenia     dexa 11/09 -1.4 spine, -1.2 hip  . Primiparous   . History of chickenpox   . Single kidney     left removed cong UPJ obstruction  . Pineal gland cyst     incidental finding  . Heart palpitations 10/13/2013    Past Surgical History  Procedure Laterality Date  . Appendectomy   2006  . Cholecystectomy  2001  . Abdominal hysterectomy  1996    age 83 total severe endometriosis  . Tonsillectomy  1965  . Left kidney  2002    removed congential UPJ obstruction     Social History   Social History  . Marital Status: Married    Spouse Name: N/A  . Number of Children: 2  . Years of Education: N/A   Occupational History  . Business Psychologist    Social History Main Topics  . Smoking status: Never Smoker   . Smokeless tobacco: Not on file  . Alcohol Use: 5.4 oz/week    9 Glasses of wine per week  . Drug Use: No  . Sexual Activity: Yes    Birth Control/ Protection: Surgical   Other Topics Concern  . Not on file   Social History Narrative   1/2 brother- schizopherenia   Occupation: Ph.D- Publishing rights manager   Married   [place] of 4   Regular exercise- yes   2 adopted kids 2 dogs   Originally from [place]  Constitutional: Positive for diaphoresis (night sweats) and fatigue. Negative for fever, chills and appetite change.  HENT: Negative for hearing loss.   Eyes: Negative for visual disturbance.  Respiratory: Positive for chest tightness (with  exercise sometimes). Negative for cough, shortness of breath and wheezing.   Cardiovascular: Positive for palpitations (couple times a week). Negative for chest pain and leg swelling.  Gastrointestinal: Negative for nausea, abdominal pain, diarrhea, constipation and blood in stool.       No GERD  Endocrine: Positive for polydipsia and polyuria.  Genitourinary: Negative for dysuria and hematuria.  Musculoskeletal: Negative for myalgias and arthralgias.  Skin: Negative for rash.  Neurological: Positive for dizziness (hormonal, occasional). Negative for weakness, light-headedness, numbness and headaches.  Psychiatric/Behavioral: Positive for sleep disturbance (awake 2-5 am most nights). Negative for dysphoric mood. The patient is nervous/anxious.        Objective:   Filed Vitals:    02/17/15 0811  BP: 130/86  Pulse: 69  Temp: 98.5 F (36.9 C)  Resp: 16   Filed Weights   02/17/15 0811  Weight: 155 lb (70.308 kg)   Body mass index is 25.79 kg/(m^2).   Physical Exam Constitutional: She appears well-developed and well-nourished. No distress.  HENT:  Head: Normocephalic and atraumatic.  Right Ear: External ear normal.  Left Ear: External ear normal.  Mouth/Throat: Oropharynx is clear and moist.  Normal bilateral ear canals and tympanic membranes  Eyes: Conjunctivae and EOM are normal.  Neck: Neck supple. No tracheal deviation present. No thyromegaly present.  No carotid bruit  Cardiovascular: Normal rate, regular rhythm and normal heart sounds.   No murmur heard. Pulmonary/Chest: Effort normal and breath sounds normal. No respiratory distress. She has no wheezes. She has no rales.  Abdominal: Soft. She exhibits no distension. There is no tenderness.  Musculoskeletal: She exhibits no edema.  Lymphadenopathy:    She has no cervical adenopathy.  Skin: Skin is warm and dry. She is not diaphoretic.  Psychiatric: She has a normal mood and affect. Her behavior is normal.         Assessment & Plan:   Physical exam: Screening blood work Immunizations: shingles today, other immunizations up to date Colonoscopy up to date Mammogram up to date Eye exams up to date EKG done last year Exercises regularly Weight good Skin no concerns, no skin changes Substance abuse - none   Fatigue/ insomnia Her poor sleep is likely contributing to her fatigue - will try trazodone. She thinks she may have tried it in the past, but is not sure Ativan only as needed - discussed potential concerns with taking it long term Will check blood work  She is concerned with possible adrenal fatigue - will check cortisol level and refer to endocrine  She was on hormone replacement and wants to restart - does not see gyn.  She can discuss with endocrine, but will consider a  naturopath

## 2015-02-21 LAB — VITAMIN D 1,25 DIHYDROXY
VITAMIN D 1, 25 (OH) TOTAL: 63 pg/mL (ref 18–72)
Vitamin D3 1, 25 (OH)2: 63 pg/mL

## 2015-02-23 ENCOUNTER — Telehealth: Payer: Self-pay | Admitting: Internal Medicine

## 2015-02-23 NOTE — Telephone Encounter (Signed)
Patient called in for lab results. I told her Dr. Quay Burow notes and she would still like a call back

## 2015-02-24 NOTE — Telephone Encounter (Signed)
Have spoke with pt about lab results and mailed to pt.

## 2015-02-26 DIAGNOSIS — J3089 Other allergic rhinitis: Secondary | ICD-10-CM | POA: Diagnosis not present

## 2015-02-26 DIAGNOSIS — J3081 Allergic rhinitis due to animal (cat) (dog) hair and dander: Secondary | ICD-10-CM | POA: Diagnosis not present

## 2015-02-26 DIAGNOSIS — J301 Allergic rhinitis due to pollen: Secondary | ICD-10-CM | POA: Diagnosis not present

## 2015-03-05 ENCOUNTER — Telehealth: Payer: Self-pay | Admitting: Emergency Medicine

## 2015-03-05 NOTE — Telephone Encounter (Signed)
Spoke with pt to inform.  

## 2015-03-08 ENCOUNTER — Telehealth: Payer: Self-pay | Admitting: Internal Medicine

## 2015-03-08 MED ORDER — ESTRADIOL 0.025 MG/24HR TD PTTW: 1.0000 | MEDICATED_PATCH | TRANSDERMAL | Status: DC

## 2015-03-08 MED FILL — ESTRADIOL 0.025 MG PATCH: 0.025 | 84 days supply | Qty: 24 | Fill #0 | Status: TO

## 2015-03-08 MED FILL — ANUCORT-HC 25 MG SUPPOSITOR: 25 | 30 days supply | Qty: 60 | Fill #1

## 2015-03-08 NOTE — Telephone Encounter (Signed)
Pt is requesting a prescription for VIVELLE-DOT 0.025 MG/24HR [id number] DISCONTINUED  She states you spoke about this at her last appointment. Pharm[facility].  She is heading over there today for other prescriptions and would like to get that also. Please give her a call

## 2015-03-08 NOTE — Telephone Encounter (Signed)
Sent. Pt notified.

## 2015-03-08 NOTE — Telephone Encounter (Signed)
Ok to refill 

## 2015-03-08 NOTE — Telephone Encounter (Signed)
Okay to fill? 

## 2015-03-15 DIAGNOSIS — J3081 Allergic rhinitis due to animal (cat) (dog) hair and dander: Secondary | ICD-10-CM | POA: Diagnosis not present

## 2015-03-15 DIAGNOSIS — J3089 Other allergic rhinitis: Secondary | ICD-10-CM | POA: Diagnosis not present

## 2015-03-15 DIAGNOSIS — J301 Allergic rhinitis due to pollen: Secondary | ICD-10-CM | POA: Diagnosis not present

## 2015-04-02 MED FILL — traZODone HCL 50 MG TABS: 50 | 30 days supply | Qty: 60 | Fill #0 | Status: TO

## 2015-04-09 DIAGNOSIS — J3081 Allergic rhinitis due to animal (cat) (dog) hair and dander: Secondary | ICD-10-CM | POA: Diagnosis not present

## 2015-04-09 DIAGNOSIS — J301 Allergic rhinitis due to pollen: Secondary | ICD-10-CM | POA: Diagnosis not present

## 2015-04-09 DIAGNOSIS — J3089 Other allergic rhinitis: Secondary | ICD-10-CM | POA: Diagnosis not present

## 2015-04-23 DIAGNOSIS — J3081 Allergic rhinitis due to animal (cat) (dog) hair and dander: Secondary | ICD-10-CM | POA: Diagnosis not present

## 2015-04-23 DIAGNOSIS — J301 Allergic rhinitis due to pollen: Secondary | ICD-10-CM | POA: Diagnosis not present

## 2015-04-23 DIAGNOSIS — J3089 Other allergic rhinitis: Secondary | ICD-10-CM | POA: Diagnosis not present

## 2015-05-07 DIAGNOSIS — J301 Allergic rhinitis due to pollen: Secondary | ICD-10-CM | POA: Diagnosis not present

## 2015-05-07 DIAGNOSIS — J3089 Other allergic rhinitis: Secondary | ICD-10-CM | POA: Diagnosis not present

## 2015-05-07 DIAGNOSIS — J3081 Allergic rhinitis due to animal (cat) (dog) hair and dander: Secondary | ICD-10-CM | POA: Diagnosis not present

## 2015-05-10 ENCOUNTER — Ambulatory Visit (INDEPENDENT_AMBULATORY_CARE_PROVIDER_SITE_OTHER): Payer: 59 | Admitting: Pulmonary Disease

## 2015-05-10 ENCOUNTER — Encounter: Payer: Self-pay | Admitting: Pulmonary Disease

## 2015-05-10 VITALS — BP 104/82 | HR 70 | Ht 65.0 in | Wt 156.0 lb

## 2015-05-10 DIAGNOSIS — R911 Solitary pulmonary nodule: Secondary | ICD-10-CM | POA: Diagnosis not present

## 2015-05-10 DIAGNOSIS — R0609 Other forms of dyspnea: Secondary | ICD-10-CM | POA: Diagnosis not present

## 2015-05-10 MED ORDER — ALBUTEROL SULFATE HFA 108 (90 BASE) MCG/ACT IN AERS: 2.0000 | INHALATION_SPRAY | Freq: Four times a day (QID) | RESPIRATORY_TRACT | Status: DC | PRN

## 2015-05-10 NOTE — Patient Instructions (Signed)
Rx for albuterol 1-2 puffs as needed prior to exercise Nodule is benign & does not need FU

## 2015-05-10 NOTE — Assessment & Plan Note (Signed)
Nodule is benign & does not need FU

## 2015-05-10 NOTE — Assessment & Plan Note (Signed)
Rx for albuterol 1-2 puffs as needed prior to exercise She will call if pattern changes

## 2015-05-10 NOTE — Progress Notes (Signed)
   Subjective:    Patient ID: Maria Kane, female    DOB: 09-24-1955, 59 y.o.   MRN: [id number]  HPI  60 year old never smoker for FU of dyspnea on exertion since summer of 2015. She reports a feeling of chest tightness, especially when exercising, walking up hills. This improves when she stops exercising. She reports similar symptoms 5 years ago during cycling class which he attributed to indoor humidity. She underwent a cardiac workup by Dr. Luvenia Redden treadmill stress test, CT chest 09/2013 had a low calcium score. Incidentally this picked up a 5 mm nodule in the left lower lobe and another 4 mm nodule in the right mid lung. She reports seasonal allergies in the spring and fall, and takes allergy shots every 2 weeks by Dr. Donneta Romberg, allergy testing was positive for ragweed, trees and dogs.  She smoked for about 3 years as a teenager but never persisted, is a social drinker. She is self-employed as a Engineer, water for [employer]. Her father had lung cancer and siblings have allergic tendencies.  There is no family history of venous thromboembolism. Her sister was diagnosed with polymyositis attributed to statins   05/10/2015  Chief Complaint  Patient presents with  . Follow-up    DOE doing much better.  She thinks it is weather related, she feels better in colder weather.  no concerns.  Does wants inhaler for the summer.    Her dyspnea is somewhat improved-it does persist when she exercises outdoors She wonders whether she needs an albuterol inhaler She continues to see the allergist for allergy shots We reviewed CT scan  Significant tests/ events  Spirometry >> no airway obstruction Post exercise - HR increased to 140, but no obstruction D-dimer neg 09/2013   CT chest 11/2014 unchanged nodule since 2006  Review of Systems Patient denies significant dyspnea,cough, hemoptysis,  chest pain, palpitations, pedal edema, orthopnea, paroxysmal nocturnal dyspnea,  lightheadedness, nausea, vomiting, abdominal or  leg pains      Objective:   Physical Exam  Gen. Pleasant, well-nourished, in no distress ENT - no lesions, no post nasal drip Neck: No JVD, no thyromegaly, no carotid bruits Lungs: no use of accessory muscles, no dullness to percussion, clear without rales or rhonchi  Cardiovascular: Rhythm regular, heart sounds  normal, no murmurs or gallops, no peripheral edema Musculoskeletal: No deformities, no cyanosis or clubbing        Assessment & Plan:

## 2015-05-13 DIAGNOSIS — Z1231 Encounter for screening mammogram for malignant neoplasm of breast: Secondary | ICD-10-CM | POA: Diagnosis not present

## 2015-05-13 DIAGNOSIS — Z803 Family history of malignant neoplasm of breast: Secondary | ICD-10-CM | POA: Diagnosis not present

## 2015-05-17 MED FILL — VENTOLIN HFA 90 MCG INHALER: 108 (90 BAS | 30 days supply | Qty: 18 | Fill #0

## 2015-05-21 DIAGNOSIS — J3081 Allergic rhinitis due to animal (cat) (dog) hair and dander: Secondary | ICD-10-CM | POA: Diagnosis not present

## 2015-05-21 DIAGNOSIS — J3089 Other allergic rhinitis: Secondary | ICD-10-CM | POA: Diagnosis not present

## 2015-05-21 DIAGNOSIS — J301 Allergic rhinitis due to pollen: Secondary | ICD-10-CM | POA: Diagnosis not present

## 2015-05-26 ENCOUNTER — Encounter: Payer: Self-pay | Admitting: Internal Medicine

## 2015-05-27 MED FILL — traZODone HCL 50 MG TABS: 50 | 30 days supply | Qty: 60 | Fill #0

## 2015-06-07 DIAGNOSIS — J3081 Allergic rhinitis due to animal (cat) (dog) hair and dander: Secondary | ICD-10-CM | POA: Diagnosis not present

## 2015-06-07 DIAGNOSIS — J301 Allergic rhinitis due to pollen: Secondary | ICD-10-CM | POA: Diagnosis not present

## 2015-06-07 DIAGNOSIS — J3089 Other allergic rhinitis: Secondary | ICD-10-CM | POA: Diagnosis not present

## 2015-06-24 ENCOUNTER — Telehealth: Payer: Self-pay | Admitting: Internal Medicine

## 2015-06-24 DIAGNOSIS — J3089 Other allergic rhinitis: Secondary | ICD-10-CM | POA: Diagnosis not present

## 2015-06-24 DIAGNOSIS — J3081 Allergic rhinitis due to animal (cat) (dog) hair and dander: Secondary | ICD-10-CM | POA: Diagnosis not present

## 2015-06-24 DIAGNOSIS — J301 Allergic rhinitis due to pollen: Secondary | ICD-10-CM | POA: Diagnosis not present

## 2015-06-24 NOTE — Telephone Encounter (Signed)
How long she going for?   what cities will she be visiting?   Has she had hepatitis A vaccine?

## 2015-06-24 NOTE — Telephone Encounter (Signed)
Are there any recommended vaccines pt should get before traveling?

## 2015-06-24 NOTE — Telephone Encounter (Signed)
Please follow up with patient on immunizations.  She is traveling to [place] and would like to get neccessary injections. Please follow up in regards.

## 2015-06-25 NOTE — Telephone Encounter (Signed)
Spoke with pt, she stated she will be gone for two weeks, and has done the research and will need Tetanus and Hep A, she is up to date on Tetanus. Have contacted [place] office to have them send over pts immunization records.

## 2015-06-29 NOTE — Telephone Encounter (Signed)
Having Sharyn Lull from [place] office fax over Immunization report.

## 2015-06-29 NOTE — Telephone Encounter (Signed)
Spoke with pt, she does not recall getting two separate vaccines. Made an appt for the nurse schedule on 06/30/15 for Hep A vaccine.

## 2015-06-29 NOTE — Telephone Encounter (Signed)
Pt is from [place] and we are not able to get an immunization record through the [facility] records. Pt states that she went to the same [place] in 2015 and believes that she had the Vaccine then. Please advise what you think the pt should do, titer? Immunization?

## 2015-06-29 NOTE — Telephone Encounter (Signed)
She most likely had it in 2015 - she can either come in for a titer to know for sure or get a vaccine, which is safe.  Typically for hep A we given two separate vaccine for life long immunity, so it is safe to have another one.

## 2015-06-30 ENCOUNTER — Ambulatory Visit: Payer: 59

## 2015-07-01 MED FILL — ANUCORT-HC 25 MG SUPPOSITOR: 25 | 30 days supply | Qty: 60 | Fill #2

## 2015-07-02 ENCOUNTER — Ambulatory Visit: Payer: 59

## 2015-07-09 ENCOUNTER — Ambulatory Visit (INDEPENDENT_AMBULATORY_CARE_PROVIDER_SITE_OTHER): Payer: 59

## 2015-07-09 DIAGNOSIS — Z23 Encounter for immunization: Secondary | ICD-10-CM | POA: Diagnosis not present

## 2015-07-13 DIAGNOSIS — J3089 Other allergic rhinitis: Secondary | ICD-10-CM | POA: Diagnosis not present

## 2015-07-13 DIAGNOSIS — J301 Allergic rhinitis due to pollen: Secondary | ICD-10-CM | POA: Diagnosis not present

## 2015-07-13 DIAGNOSIS — J3081 Allergic rhinitis due to animal (cat) (dog) hair and dander: Secondary | ICD-10-CM | POA: Diagnosis not present

## 2015-07-23 DIAGNOSIS — J301 Allergic rhinitis due to pollen: Secondary | ICD-10-CM | POA: Diagnosis not present

## 2015-07-23 DIAGNOSIS — J3081 Allergic rhinitis due to animal (cat) (dog) hair and dander: Secondary | ICD-10-CM | POA: Diagnosis not present

## 2015-07-23 DIAGNOSIS — J3089 Other allergic rhinitis: Secondary | ICD-10-CM | POA: Diagnosis not present

## 2015-08-03 DIAGNOSIS — J3089 Other allergic rhinitis: Secondary | ICD-10-CM | POA: Diagnosis not present

## 2015-08-03 DIAGNOSIS — J301 Allergic rhinitis due to pollen: Secondary | ICD-10-CM | POA: Diagnosis not present

## 2015-08-03 DIAGNOSIS — J3081 Allergic rhinitis due to animal (cat) (dog) hair and dander: Secondary | ICD-10-CM | POA: Diagnosis not present

## 2015-08-13 ENCOUNTER — Ambulatory Visit (INDEPENDENT_AMBULATORY_CARE_PROVIDER_SITE_OTHER): Payer: 59 | Admitting: Internal Medicine

## 2015-08-13 ENCOUNTER — Encounter: Payer: Self-pay | Admitting: Internal Medicine

## 2015-08-13 VITALS — BP 120/84 | HR 80 | Temp 97.5°F | Ht 65.0 in | Wt 147.0 lb

## 2015-08-13 DIAGNOSIS — Z7189 Other specified counseling: Secondary | ICD-10-CM

## 2015-08-13 DIAGNOSIS — Z7184 Encounter for health counseling related to travel: Secondary | ICD-10-CM

## 2015-08-13 MED ORDER — CEFDINIR 300 MG PO CAPS: 300.0000 mg | ORAL_CAPSULE | Freq: Two times a day (BID) | ORAL | Status: DC

## 2015-08-13 MED ORDER — CIPROFLOXACIN HCL 500 MG PO TABS: 500.0000 mg | ORAL_TABLET | Freq: Two times a day (BID) | ORAL | Status: DC

## 2015-08-13 MED FILL — CIPROFLOXACIN HCL 500 MG TA: 500 | 3 days supply | Qty: 6 | Fill #0

## 2015-08-13 MED FILL — CEFDINIR 300 MG CAPSULE: 300 | 7 days supply | Qty: 14 | Fill #0

## 2015-08-13 NOTE — Patient Instructions (Addendum)
Cipro is for UTI's and travelers diarrhea - take as directed  Carole Civil is for upper respiratory infections - take as directed  Menopause treatments: Estrogen therapy, progestin  Relaxation-paced respiration Exercise and weight loss SSRIs, SSRIs-Prozac, Lexapro, Effexor Gabapentin Clonidine transdermal  Acupuncture Black cohosh Evening primrose oil Soy products Phyto-estrogens-nonsteroidal components that occur naturally in plants, fruits and vegetables

## 2015-08-13 NOTE — Progress Notes (Signed)
Pre visit review using our clinic review tool, if applicable. No additional management support is needed unless otherwise documented below in the visit note. 

## 2015-08-14 NOTE — Progress Notes (Signed)
Subjective:    Patient ID: Maria Kane, female    DOB: December 02, 1955, 60 y.o.   MRN: [id number]  HPI She is traveling to [place] next month and is here today because she would like to take antibiotics with her just in case she gets sick.  She is concerned about an upper respiratory infection or travelers diarrhea.    She is up to date with all of her immunizations.  She recently got hepatitis A vaccine.    She is going for a mission trip and has gone to the country before.  She will be staying at a family's house.    Medications and allergies reviewed with patient and updated if appropriate.  Patient Active Problem List   Diagnosis Date Noted  . Solitary pulmonary nodule 12/25/2013  . DOE (dyspnea on exertion) 11/27/2013  . Palpitation 10/16/2013  . Hyperlipidemia 10/16/2013  . Chest pain 10/12/2013  . Hemorrhoid 07/25/2012  . Insomnia 02/08/2012  . Knee pain 03/24/2011  . Breast lump on left side at 6 o'clock position 02/01/2011  . Single kidney   . Postmenopausal atrophic vaginitis 09/28/2010  . Allergic rhinitis   . NUMBNESS 01/05/2010  . OSTEOPENIA 01/28/2008    Current Outpatient Prescriptions on File Prior to Visit  Medication Sig Dispense Refill  . albuterol (PROVENTIL HFA;VENTOLIN HFA) 108 (90 Base) MCG/ACT inhaler Inhale 2 puffs into the lungs every 6 (six) hours as needed for wheezing or shortness of breath. 1 Inhaler 2  . fexofenadine (ALLEGRA) 180 MG tablet Take 180 mg by mouth daily.    . hydrocortisone (ANUSOL-HC) 25 MG suppository Place 1 suppository (25 mg total) rectally 2 (two) times daily as needed for hemorrhoids or itching. 60 suppository 6  . S-Adenosylmethionine (SAM-E PO) Take 1,200 mg by mouth daily. 1600 mg daily    . traZODone (DESYREL) 50 MG tablet Take 1-2 tablets (50-100 mg total) by mouth at bedtime as needed for sleep. (Patient taking differently: Take 25 mg by mouth at bedtime as needed for sleep. ) 60 tablet 3  . LORazepam (ATIVAN) 0.5 MG  tablet 1 tablet po once or twice a month (Patient not taking: Reported on 05/10/2015) 10 tablet 2   No current facility-administered medications on file prior to visit.    Past Medical History  Diagnosis Date  . Allergic rhinitis     dogs, dust, tree and ragweed  . Osteopenia     dexa 11/09 -1.4 spine, -1.2 hip  . Primiparous   . History of chickenpox   . Single kidney     left removed cong UPJ obstruction  . Pineal gland cyst     incidental finding  . Heart palpitations 10/13/2013    Past Surgical History  Procedure Laterality Date  . Appendectomy  2006  . Cholecystectomy  2001  . Abdominal hysterectomy  1996    age 44 total severe endometriosis  . Tonsillectomy  1965  . Left kidney  2002    removed congential UPJ obstruction     Social History   Social History  . Marital Status: Married    Spouse Name: N/A  . Number of Children: 2  . Years of Education: N/A   Occupational History  . Business Psychologist    Social History Main Topics  . Smoking status: Never Smoker   . Smokeless tobacco: None  . Alcohol Use: 5.4 oz/week    9 Glasses of wine per week  . Drug Use: No  . Sexual Activity: Yes  Birth Control/ Protection: Surgical   Other Topics Concern  . None   Social History Narrative   1/2 brother- schizopherenia   Occupation: Ph.D- Publishing rights manager   Married   HH of 4   Regular exercise- yes, power walking   2 adopted kids 2 dogs   Originally from [place]    Family History  Problem Relation Age of Onset  . Breast cancer Mother   . Osteoarthritis Mother   . Multiple sclerosis Mother   . Hypertension Father   . Lung cancer Father   . Stroke Father   . Heart disease Father     pacemaker  . Diabetes Sister   . Hypertension Sister   . Polymyositis Sister   . Diabetes Brother   . Hypertension Brother     Review of Systems     Objective:   Filed Vitals:   08/13/15 1554  BP: 120/84  Pulse: 80  Temp: 97.5 F (36.4 C)   Filed  Weights   08/13/15 1554  Weight: 147 lb (66.679 kg)   Body mass index is 24.46 kg/(m^2).   Physical Exam        Assessment & Plan:   Travel advice/recommendations  Up to date with vaccines Discussed risk of Zika - mosquitos Will give an antibiotic for URI's - advised only to take if it is a significant URI and not a common cold - Cefdinir which she has taken previously Will also give an antibiotic for possible UTIs or travelers diarrhea - Cipro - discussed possible side effects of tendinitis / neuropathy - stop medication if symptoms occur

## 2015-08-17 MED FILL — traZODone HCL 50 MG TABS: 50 | 30 days supply | Qty: 60 | Fill #1

## 2015-08-27 DIAGNOSIS — J301 Allergic rhinitis due to pollen: Secondary | ICD-10-CM | POA: Diagnosis not present

## 2015-08-27 DIAGNOSIS — J3081 Allergic rhinitis due to animal (cat) (dog) hair and dander: Secondary | ICD-10-CM | POA: Diagnosis not present

## 2015-08-27 DIAGNOSIS — J3089 Other allergic rhinitis: Secondary | ICD-10-CM | POA: Diagnosis not present

## 2015-09-27 ENCOUNTER — Telehealth: Payer: Self-pay | Admitting: Internal Medicine

## 2015-09-27 DIAGNOSIS — J301 Allergic rhinitis due to pollen: Secondary | ICD-10-CM | POA: Diagnosis not present

## 2015-09-27 DIAGNOSIS — J3089 Other allergic rhinitis: Secondary | ICD-10-CM | POA: Diagnosis not present

## 2015-09-27 DIAGNOSIS — J3081 Allergic rhinitis due to animal (cat) (dog) hair and dander: Secondary | ICD-10-CM | POA: Diagnosis not present

## 2015-09-27 NOTE — Telephone Encounter (Signed)
PLEASE NOTE: All timestamps contained within this report are represented as [place] Standard Time. CONFIDENTIALTY NOTICE: This fax transmission is intended only for the addressee. It contains information that is legally privileged, confidential or otherwise protected from use or disclosure. If you are not the intended recipient, you are strictly prohibited from reviewing, disclosing, copying using or disseminating any of this information or taking any action in reliance on or regarding this information. If you have received this fax in error, please notify us immediately by telephone so that we can arrange for its return to [place]. Phone: [phone], Toll-Free: [phone], Fax: [phone] Page: 1 of 1 Call Id: [id number] [place] [facility] - Client [place] Patient Name: Maria Kane DOB: [birth year] Initial Comment chart 1/3 Caller says, family came back from [place] last week She got sick the day after she came back. She still feels fatigued and has some mild confusion, the rest of the Sx have gone. Husband was Dx with E-coli and in hospital. She wants to know if she may be tested for E-coli because she only has 1 kidney. Nurse Assessment Nurse: Dimas Chyle, RN, Dellis Filbert Date/Time Eilene Ghazi Time): 09/27/2015 9:11:50 AM Confirm and document reason for call. If symptomatic, describe symptoms. You must click the next button to save text entered. ---chart 1/3 Caller says, family came back from [place] last week She got sick the day after she came back. She still feels fatigued and has some mild confusion, the rest of the Sx have gone. Husband was Dx with E-coli and in hospital. She wants to know if she may be tested for E-coli because she only has 1 kidney. Has the patient traveled out of the country within the last 30 days? ---No Does the patient have any new or worsening symptoms? ---Yes Will a triage be completed? ---Yes Related  visit to physician within the last 2 weeks? ---N/A Does the PT have any chronic conditions? (i.e. diabetes, asthma, etc.) ---Yes List chronic conditions. ---One kidney. Is this a behavioral health or substance abuse call? ---No Guidelines Guideline Title Affirmed Question Affirmed Notes Confusion - Delirium [1] Longstanding confusion (e.g., dementia, stroke) AND [2] worsening Final Disposition User See Physician within 4 Hours (or PCP triage) Dimas Chyle, RN, Dellis Filbert Comments Patient did not accept 4 hour outcome but insisted on being scheduled for next available appointment which was for 09/28/15 with Dr. Elna Breslow as her PCP was out of the office this week. Referrals REFERRED TO PCP OFFICE Disagree/Comply: Comply

## 2015-09-27 NOTE — Telephone Encounter (Signed)
Please advise 

## 2015-09-28 ENCOUNTER — Ambulatory Visit (INDEPENDENT_AMBULATORY_CARE_PROVIDER_SITE_OTHER): Payer: 59 | Admitting: Family

## 2015-09-28 ENCOUNTER — Encounter: Payer: Self-pay | Admitting: Family

## 2015-09-28 ENCOUNTER — Other Ambulatory Visit (INDEPENDENT_AMBULATORY_CARE_PROVIDER_SITE_OTHER): Payer: 59

## 2015-09-28 DIAGNOSIS — S8002XA Contusion of left knee, initial encounter: Secondary | ICD-10-CM | POA: Diagnosis not present

## 2015-09-28 DIAGNOSIS — R195 Other fecal abnormalities: Secondary | ICD-10-CM | POA: Diagnosis not present

## 2015-09-28 LAB — BASIC METABOLIC PANEL
BUN: 19 mg/dL (ref 6–23)
CHLORIDE: 102 meq/L (ref 96–112)
CO2: 31 mEq/L (ref 19–32)
Calcium: 10.2 mg/dL (ref 8.4–10.5)
Creatinine, Ser: 0.87 mg/dL (ref 0.40–1.20)
GFR: 70.94 mL/min (ref >60.00)
GLUCOSE: 91 mg/dL (ref 70–99)
POTASSIUM: 4.4 meq/L (ref 3.5–5.1)
SODIUM: 141 meq/L (ref 135–145)

## 2015-09-28 LAB — CBC
HCT: 43.3 % (ref 36.0–46.0)
Hemoglobin: 14.9 g/dL (ref 12.0–15.0)
MCHC: 34.3 g/dL (ref 30.0–36.0)
MCV: 96.1 fl (ref 78.0–100.0)
PLATELETS: 284 10*3/uL (ref 150.0–400.0)
RBC: 4.51 Mil/uL (ref 3.87–5.11)
RDW: 13.3 % (ref 11.5–15.5)
WBC: 10.1 10*3/uL (ref 4.0–10.5)

## 2015-09-28 NOTE — Assessment & Plan Note (Signed)
Symptoms and exam consistent with left knee contusion. Treat conservatively with ice, elevation, and compression as needed. Tylenol as needed for discomfort. Follow-up if symptoms worsen or do not improve.

## 2015-09-28 NOTE — Patient Instructions (Signed)
Thank you for choosing [facility].  Summary/Instructions:   Please stop by the lab on the lower level of the building for your blood work. Your results will be released to [place] (or called to you) after review, usually within 72 hours after test completion. If any changes need to be made, you will be notified at that same time.  1. The lab is open from 7:30am to 5:30 pm Monday-Friday  2. No appointment is necessary  3. Fasting (if needed) is 6-8 hours after food and drink; black  coffee and water  are okay   If your symptoms worsen or fail to improve, please contact our office for further instruction, or in case of emergency go directly to the emergency room at the closest medical facility.

## 2015-09-28 NOTE — Assessment & Plan Note (Addendum)
Loose stools and diarrhea following return home from [place]. No significant findings of infection. Reports symptoms have appeared to improve since initial onset. Check stool culture and ova/parasites. Obtain CBC and basic metabolic profile. Recommend continue to monitor pending lab work.

## 2015-09-28 NOTE — Progress Notes (Signed)
Subjective:    Patient ID: Maria Kane, female    DOB: 25-Aug-1955, 60 y.o.   MRN: [id number]  Chief Complaint  Patient presents with  . E coli    worried about having E-coli     HPI:  Maria Kane is a 60 y.o. female who  has a past medical history of Allergic rhinitis; Heart palpitations (10/13/2013); History of chickenpox; Osteopenia; Pineal gland cyst; Primiparous; and Single kidney. and presents today for an acute office visit.   1.) Loose stool - This is a new problem. Recently went on a trip to [place] and during the last part of the trip she started having explosive diarrhea and "almost blacking out".  Notes that her husband has been in the hospital with an E. Coli infection. She did have Ciprofloxacin but did not take it. Current symptoms include exhaustion, decreased appetite and loose stools. Frequency of bowel movements in 1-2 per day which is the baseline frequency for her. Denies fevers or abdominal pain.  2.) Knee pain - This is a new problem. Associated symptom of pain located in anterior aspect of her left knee has been going on for a couple of days following a fall. Denies any sounds/sensations heard or felt. Modifying factors include ice.    Allergies  Allergen Reactions  . Anesthetics, Amide     Has trouble waking up  . Clarithromycin Nausea And Vomiting     Current Outpatient Prescriptions on File Prior to Visit  Medication Sig Dispense Refill  . fexofenadine (ALLEGRA) 180 MG tablet Take 180 mg by mouth daily.    . hydrocortisone (ANUSOL-HC) 25 MG suppository Place 1 suppository (25 mg total) rectally 2 (two) times daily as needed for hemorrhoids or itching. 60 suppository 6  . S-Adenosylmethionine (SAM-E PO) Take 1,200 mg by mouth daily. 1600 mg daily    . traZODone (DESYREL) 50 MG tablet Take 1-2 tablets (50-100 mg total) by mouth at bedtime as needed for sleep. (Patient taking differently: Take 25 mg by mouth at bedtime as needed for sleep. ) 60 tablet  3   No current facility-administered medications on file prior to visit.     Past Medical History:  Diagnosis Date  . Allergic rhinitis    dogs, dust, tree and ragweed  . Heart palpitations 10/13/2013  . History of chickenpox   . Osteopenia    dexa 11/09 -1.4 spine, -1.2 hip  . Pineal gland cyst    incidental finding  . Primiparous   . Single kidney    left removed cong UPJ obstruction     Review of Systems  Constitutional: Negative for chills and fever.  Gastrointestinal: Positive for diarrhea. Negative for abdominal distention, abdominal pain, blood in stool and constipation.  Neurological: Positive for light-headedness. Negative for dizziness.      Objective:    BP 140/82 (BP Location: Left Arm, Patient Position: Sitting, Cuff Size: Normal)   Pulse 82   Temp 98.2 F (36.8 C) (Oral)   Resp 14   Ht 5\' 5"  (1.651 m)   Wt 147 lb (66.7 kg)   SpO2 96%   BMI 24.46 kg/m  Nursing note and vital signs reviewed.  Physical Exam  Constitutional: She is oriented to person, place, and time. She appears well-developed and well-nourished. No distress.  Cardiovascular: Normal rate, regular rhythm, normal heart sounds and intact distal pulses.   Pulmonary/Chest: Effort normal and breath sounds normal.  Abdominal: Normal appearance and bowel sounds are normal. She exhibits no distension  and no mass. There is no hepatosplenomegaly. There is no tenderness. There is no rigidity, no rebound, no guarding, no tenderness at McBurney's point and negative Murphy's sign.  Musculoskeletal:  Left knee - no obvious deformity, discoloration or edema. Mild tenderness over anterior joint lines and bilaterally and patellar tendon. No crepitus or deformity. Range of motion is within normal limits and strength is normal. Distal pulses and sensation are intact and appropriate. Ligamentous and meniscal testing is negative.   Neurological: She is alert and oriented to person, place, and time.  Skin: Skin is  warm and dry.  Psychiatric: She has a normal mood and affect. Her behavior is normal. Judgment and thought content normal.       Assessment & Plan:   Problem List Items Addressed This Visit      Other   Loose stools    Loose stools and diarrhea following return home from [place]. No significant findings of infection. Reports symptoms have appeared to improve since initial onset. Check stool culture and ova/parasites. Obtain CBC and basic metabolic profile. Recommend continue to monitor pending lab work.       Relevant Orders   Stool Culture   Ova and parasite examination   CBC (Completed)   Basic Metabolic Panel (BMET) (Completed)   Contusion of left knee    Symptoms and exam consistent with left knee contusion. Treat conservatively with ice, elevation, and compression as needed. Tylenol as needed for discomfort. Follow-up if symptoms worsen or do not improve.       Other Visit Diagnoses   None.      I have discontinued Ms. Baptista's LORazepam, albuterol, cefdinir, and ciprofloxacin. I am also having her maintain her S-Adenosylmethionine (SAM-E PO), fexofenadine, hydrocortisone, and traZODone.   Follow-up: Return if symptoms worsen or fail to improve.  Mauricio Po, FNP

## 2015-09-29 ENCOUNTER — Other Ambulatory Visit: Payer: 59

## 2015-09-29 DIAGNOSIS — R195 Other fecal abnormalities: Secondary | ICD-10-CM

## 2015-09-30 LAB — OVA AND PARASITE EXAMINATION: OP: NONE SEEN

## 2015-10-04 LAB — STOOL CULTURE

## 2015-10-19 DIAGNOSIS — J3089 Other allergic rhinitis: Secondary | ICD-10-CM | POA: Diagnosis not present

## 2015-10-19 DIAGNOSIS — J3081 Allergic rhinitis due to animal (cat) (dog) hair and dander: Secondary | ICD-10-CM | POA: Diagnosis not present

## 2015-10-19 DIAGNOSIS — J301 Allergic rhinitis due to pollen: Secondary | ICD-10-CM | POA: Diagnosis not present

## 2015-10-22 DIAGNOSIS — J3089 Other allergic rhinitis: Secondary | ICD-10-CM | POA: Diagnosis not present

## 2015-10-22 DIAGNOSIS — J3081 Allergic rhinitis due to animal (cat) (dog) hair and dander: Secondary | ICD-10-CM | POA: Diagnosis not present

## 2015-10-22 DIAGNOSIS — J301 Allergic rhinitis due to pollen: Secondary | ICD-10-CM | POA: Diagnosis not present

## 2015-10-27 DIAGNOSIS — J301 Allergic rhinitis due to pollen: Secondary | ICD-10-CM | POA: Diagnosis not present

## 2015-10-27 DIAGNOSIS — J3081 Allergic rhinitis due to animal (cat) (dog) hair and dander: Secondary | ICD-10-CM | POA: Diagnosis not present

## 2015-10-27 DIAGNOSIS — J3089 Other allergic rhinitis: Secondary | ICD-10-CM | POA: Diagnosis not present

## 2015-10-29 ENCOUNTER — Other Ambulatory Visit: Payer: Self-pay | Admitting: Internal Medicine

## 2015-10-29 MED FILL — ANUCORT-HC 25 MG SUPPOSITOR: 25 | 30 days supply | Qty: 60 | Fill #0 | Status: TO

## 2015-11-04 DIAGNOSIS — L905 Scar conditions and fibrosis of skin: Secondary | ICD-10-CM | POA: Diagnosis not present

## 2015-11-04 DIAGNOSIS — D2271 Melanocytic nevi of right lower limb, including hip: Secondary | ICD-10-CM | POA: Diagnosis not present

## 2015-11-04 DIAGNOSIS — D2371 Other benign neoplasm of skin of right lower limb, including hip: Secondary | ICD-10-CM | POA: Diagnosis not present

## 2015-11-04 DIAGNOSIS — D2272 Melanocytic nevi of left lower limb, including hip: Secondary | ICD-10-CM | POA: Diagnosis not present

## 2015-11-04 DIAGNOSIS — D485 Neoplasm of uncertain behavior of skin: Secondary | ICD-10-CM | POA: Diagnosis not present

## 2015-11-04 DIAGNOSIS — D1801 Hemangioma of skin and subcutaneous tissue: Secondary | ICD-10-CM | POA: Diagnosis not present

## 2015-11-04 DIAGNOSIS — L821 Other seborrheic keratosis: Secondary | ICD-10-CM | POA: Diagnosis not present

## 2015-11-04 DIAGNOSIS — D2372 Other benign neoplasm of skin of left lower limb, including hip: Secondary | ICD-10-CM | POA: Diagnosis not present

## 2015-11-05 DIAGNOSIS — J301 Allergic rhinitis due to pollen: Secondary | ICD-10-CM | POA: Diagnosis not present

## 2015-11-05 DIAGNOSIS — J3089 Other allergic rhinitis: Secondary | ICD-10-CM | POA: Diagnosis not present

## 2015-11-05 DIAGNOSIS — J3081 Allergic rhinitis due to animal (cat) (dog) hair and dander: Secondary | ICD-10-CM | POA: Diagnosis not present

## 2015-11-08 DIAGNOSIS — J3089 Other allergic rhinitis: Secondary | ICD-10-CM | POA: Diagnosis not present

## 2015-11-08 DIAGNOSIS — J301 Allergic rhinitis due to pollen: Secondary | ICD-10-CM | POA: Diagnosis not present

## 2015-11-08 DIAGNOSIS — J3081 Allergic rhinitis due to animal (cat) (dog) hair and dander: Secondary | ICD-10-CM | POA: Diagnosis not present

## 2015-11-16 DIAGNOSIS — J3081 Allergic rhinitis due to animal (cat) (dog) hair and dander: Secondary | ICD-10-CM | POA: Diagnosis not present

## 2015-11-16 DIAGNOSIS — J301 Allergic rhinitis due to pollen: Secondary | ICD-10-CM | POA: Diagnosis not present

## 2015-11-16 DIAGNOSIS — J3089 Other allergic rhinitis: Secondary | ICD-10-CM | POA: Diagnosis not present

## 2015-11-22 ENCOUNTER — Ambulatory Visit (INDEPENDENT_AMBULATORY_CARE_PROVIDER_SITE_OTHER): Payer: 59

## 2015-11-22 DIAGNOSIS — J3089 Other allergic rhinitis: Secondary | ICD-10-CM | POA: Diagnosis not present

## 2015-11-22 DIAGNOSIS — J301 Allergic rhinitis due to pollen: Secondary | ICD-10-CM | POA: Diagnosis not present

## 2015-11-22 DIAGNOSIS — Z23 Encounter for immunization: Secondary | ICD-10-CM

## 2015-11-22 DIAGNOSIS — J3081 Allergic rhinitis due to animal (cat) (dog) hair and dander: Secondary | ICD-10-CM | POA: Diagnosis not present

## 2015-12-08 ENCOUNTER — Telehealth: Payer: Self-pay | Admitting: Internal Medicine

## 2015-12-08 DIAGNOSIS — J3089 Other allergic rhinitis: Secondary | ICD-10-CM | POA: Diagnosis not present

## 2015-12-08 DIAGNOSIS — Z803 Family history of malignant neoplasm of breast: Secondary | ICD-10-CM

## 2015-12-08 DIAGNOSIS — J301 Allergic rhinitis due to pollen: Secondary | ICD-10-CM | POA: Diagnosis not present

## 2015-12-08 DIAGNOSIS — J3081 Allergic rhinitis due to animal (cat) (dog) hair and dander: Secondary | ICD-10-CM | POA: Diagnosis not present

## 2015-12-08 NOTE — Telephone Encounter (Signed)
Patient is requesting Dr. Sharlet Salina order her a breast screening test for cancer (breaca test).  Patient states her mother and first cousin has breast cancer and would like this done.

## 2015-12-10 NOTE — Telephone Encounter (Signed)
She probably should consider seeing a Dietitian. The reason for that is there are several genes related to breast cancer beyond braca that she may want to be tested for.

## 2015-12-10 NOTE — Telephone Encounter (Signed)
Spoke with pt, Please enter a referral for generic counselor.

## 2015-12-10 NOTE — Addendum Note (Signed)
Addended by: Binnie Rail on: 12/10/2015 01:41 PM   Modules accepted: Orders

## 2015-12-16 MED FILL — ESTRADIOL 0.025 MG PATCH: 0.025 | 84 days supply | Qty: 24 | Fill #0

## 2015-12-24 ENCOUNTER — Encounter: Payer: Self-pay | Admitting: Genetic Counselor

## 2015-12-24 DIAGNOSIS — J301 Allergic rhinitis due to pollen: Secondary | ICD-10-CM | POA: Diagnosis not present

## 2015-12-24 DIAGNOSIS — J3081 Allergic rhinitis due to animal (cat) (dog) hair and dander: Secondary | ICD-10-CM | POA: Diagnosis not present

## 2015-12-24 DIAGNOSIS — J3089 Other allergic rhinitis: Secondary | ICD-10-CM | POA: Diagnosis not present

## 2015-12-28 DIAGNOSIS — Z419 Encounter for procedure for purposes other than remedying health state, unspecified: Secondary | ICD-10-CM | POA: Diagnosis not present

## 2016-01-11 ENCOUNTER — Ambulatory Visit (INDEPENDENT_AMBULATORY_CARE_PROVIDER_SITE_OTHER): Payer: 59 | Admitting: General Practice

## 2016-01-11 DIAGNOSIS — Z23 Encounter for immunization: Secondary | ICD-10-CM | POA: Diagnosis not present

## 2016-01-12 NOTE — Progress Notes (Signed)
Injection given.   Glenn Gullickson J Austen Wygant, MD  

## 2016-01-18 DIAGNOSIS — J3089 Other allergic rhinitis: Secondary | ICD-10-CM | POA: Diagnosis not present

## 2016-01-18 DIAGNOSIS — J301 Allergic rhinitis due to pollen: Secondary | ICD-10-CM | POA: Diagnosis not present

## 2016-01-18 DIAGNOSIS — J3081 Allergic rhinitis due to animal (cat) (dog) hair and dander: Secondary | ICD-10-CM | POA: Diagnosis not present

## 2016-01-19 ENCOUNTER — Other Ambulatory Visit: Payer: 59

## 2016-01-19 ENCOUNTER — Encounter: Payer: Self-pay | Admitting: Genetic Counselor

## 2016-01-19 ENCOUNTER — Ambulatory Visit (HOSPITAL_BASED_OUTPATIENT_CLINIC_OR_DEPARTMENT_OTHER): Payer: 59 | Admitting: Genetic Counselor

## 2016-01-19 DIAGNOSIS — Z803 Family history of malignant neoplasm of breast: Secondary | ICD-10-CM

## 2016-01-19 DIAGNOSIS — Z8041 Family history of malignant neoplasm of ovary: Secondary | ICD-10-CM | POA: Diagnosis not present

## 2016-01-19 DIAGNOSIS — Z315 Encounter for genetic counseling: Secondary | ICD-10-CM

## 2016-01-19 DIAGNOSIS — Z808 Family history of malignant neoplasm of other organs or systems: Secondary | ICD-10-CM | POA: Diagnosis not present

## 2016-01-19 DIAGNOSIS — Z800 Family history of malignant neoplasm of digestive organs: Secondary | ICD-10-CM | POA: Diagnosis not present

## 2016-01-19 MED FILL — ANUCORT-HC 25 MG SUPPOSITOR: 25 | 30 days supply | Qty: 60 | Fill #0

## 2016-01-19 NOTE — Progress Notes (Signed)
REFERRING PROVIDER: Binnie Rail, MD [place], [place] [address]  PRIMARY PROVIDER:  Binnie Rail, MD  PRIMARY REASON FOR VISIT:  1. Family history of breast cancer   2. Family history of ovarian cancer   3. Family history of colon cancer   4. Family history of melanoma      HISTORY OF PRESENT ILLNESS:   Maria Kane, a 60 y.o. female, was seen for a [place] cancer genetics consultation at the request of Maria Kane due to a family history of cancer.  Maria Kane presents to clinic today to discuss the possibility of a hereditary predisposition to cancer, genetic testing, and to further clarify her future cancer risks, as well as potential cancer risks for family members. Maria Kane is a 60 y.o. female with no personal history of cancer.    CANCER HISTORY:   No history exists.     HORMONAL RISK FACTORS:  Menarche was at age 42.  First live birth at age N/A.  OCP use for approximately 10 years.  Ovaries intact: no.  Hysterectomy: patient had TAH/BSO performed at age 54 for terrible endometriosis.  Menopausal status: postmenopausal.  HRT use: 19 years. Colonoscopy: yes; normal. Mammogram within the last year: yes. Number of breast biopsies: 0. Up to date with pelvic exams:  no. Any excessive radiation exposure in the past:  no  Past Medical History:  Diagnosis Date  . Allergic rhinitis    dogs, dust, tree and ragweed  . Family history of breast cancer   . Family history of colon cancer   . Family history of melanoma   . Family history of ovarian cancer   . Heart palpitations 10/13/2013  . History of chickenpox   . Osteopenia    dexa 11/09 -1.4 spine, -1.2 hip  . Pineal gland cyst    incidental finding  . Primiparous   . Single kidney    left removed cong UPJ obstruction    Past Surgical History:  Procedure Laterality Date  . ABDOMINAL HYSTERECTOMY  1996   age 9 total severe endometriosis  . APPENDECTOMY  2006  . CHOLECYSTECTOMY  2001  . left  kidney  2002   removed congential UPJ obstruction   . TONSILLECTOMY  1965    Social History   Social History  . Marital status: Married    Spouse name: N/A  . Number of children: 2  . Years of education: N/A   Occupational History  . Business Psychologist    Social History Main Topics  . Smoking status: Never Smoker  . Smokeless tobacco: Not on file  . Alcohol use 5.4 oz/week    9 Glasses of wine per week  . Drug use: No  . Sexual activity: Yes    Birth control/ protection: Surgical   Other Topics Concern  . Not on file   Social History Narrative   1/2 brother- schizopherenia   Occupation: Ph.D- Publishing rights manager   Married   [place] of 4   Regular exercise- yes, power walking   2 adopted kids 2 dogs   Originally from [place]:  We obtained a detailed, 4-generation family history.  Significant diagnoses are listed below: Family History  Problem Relation Age of Onset  . Breast cancer Mother   . Osteoarthritis Mother   . Multiple sclerosis Mother   . Hypertension Father   . Lung cancer Father   . Stroke Father   . Heart disease Father  pacemaker  . Diabetes Sister   . Hypertension Sister   . Polymyositis Sister   . Diabetes Brother   . Hypertension Brother     The patient has two adopted daughters who are cancer free.   She has one full brother and sister who are cancer free, three maternal half siblings, two sisters and a brother, who are cancer free, and four paternal half siblings, two brotehrs and two issters who are cancer free.  The patient's mother had breast cancer at age 58 and died at 50.  Her mother had one brother and six sisters.  One sister had ovarian cancer and another had colon cancer.  The sister with ovarian cancer had a daughter with breast cancer in her 39s.  One brother had a daughter who had melanoma in her 21s.    The patient's father had lung cancer and died at 67.  No information is known about his family.   Patient's maternal ancestors are of [place] and [place] descent, and paternal ancestors are of [place] descent. There is no reported Ashkenazi Jewish ancestry. There is no known consanguinity.  GENETIC COUNSELING ASSESSMENT: Maria Kane is a 60 y.o. female with a family history of cancer which is somewhat suggestive of a hereditary cancer syndrome and predisposition to cancer. We, therefore, discussed and recommended the following at today's visit.   DISCUSSION: We discussed that about 5-10% of breast cancer is due to hereditayr cancer syndromes, with most cases due to BRCA mutations.  We discussed other hereditary breast cancer genes including PABL2, ATM and CHEK2.  Based on the family history of ovarian and colon cancer we also reviewed Lynch syndrome.  We reviewed the characteristics, features and inheritance patterns of hereditary cancer syndromes. We also discussed genetic testing, including the appropriate family members to test, the process of testing, insurance coverage and turn-around-time for results. We discussed the implications of a negative, positive and/or variant of uncertain significant result. We recommended Maria Kane pursue genetic testing for the hereditary cancer gene panel. The Hereditary Gene Panel offered by [facility] includes sequencing and/or deletion duplication testing of the following 42 genes: APC, ATM, AXIN2, BARD1, BMPR1A, BRCA1, BRCA2, BRIP1, CDH1, CDKN2A, CHEK2, DICER1, EPCAM, GREM1, KIT, MEN1, MLH1, MSH2, MSH6, MUTYH, NBN, NF1, PALB2, PDGFRA, PMS2, POLD1, POLE, PTEN, RAD50, RAD51C, RAD51D, SDHA, SDHB, SDHC, SDHD, SMAD4, SMARCA4. STK11, TP53, TSC1, TSC2, and VHL.     Based on Maria Kane's family history of cancer, she meets medical criteria for genetic testing. Despite that she meets criteria, she may still have an out of pocket cost. A benefits investigation was performed and it was decided that the patient would pay out of pocket for her testing.  The lab will charge $250  for her genetic test.  PLAN: After considering the risks, benefits, and limitations, Maria Kane  provided informed consent to pursue genetic testing and the blood sample was sent to [facility] for analysis of the Common hereditary cancer panel. Results should be available within approximately 2-3 weeks' time, at which point they will be disclosed by telephone to Maria Kane, as will any additional recommendations warranted by these results. Maria Kane will receive a summary of her genetic counseling visit and a copy of her results once available. This information will also be available in Epic. We encouraged Maria Kane to remain in contact with cancer genetics annually so that we can continuously update the family history and inform her of any changes in cancer genetics and testing that may be  of benefit for her family. Maria Kane questions were answered to her satisfaction today. Our contact information was provided should additional questions or concerns arise.  Based on Maria Kane's family history, we recommended her maternal cousin, who was diagnosed with breast cancer in her 9s, have genetic counseling and testing. Maria Kane will let us know if we can be of any assistance in coordinating genetic counseling and/or testing for this family member.   Lastly, we encouraged Maria Kane to remain in contact with cancer genetics annually so that we can continuously update the family history and inform her of any changes in cancer genetics and testing that may be of benefit for this family.   Ms.  Kane questions were answered to her satisfaction today. Our contact information was provided should additional questions or concerns arise. Thank you for the referral and allowing [place] to share in the care of your patient.   Maria Kane P. Florene Glen, [place], [facility] Certified Genetic Counselor [email or web address] phone: [phone]  The patient was seen for a total of 45 minutes in face-to-face genetic  counseling.  This patient was discussed with Drs. Magrinat, Lindi Adie and/or Burr Medico who agrees with the above.    _______________________________________________________________________ For Office Staff:  Number of people involved in session: 1 Was an Intern/ student involved with case: no

## 2016-01-24 DIAGNOSIS — S83242A Other tear of medial meniscus, current injury, left knee, initial encounter: Secondary | ICD-10-CM | POA: Diagnosis not present

## 2016-02-03 DIAGNOSIS — J3089 Other allergic rhinitis: Secondary | ICD-10-CM | POA: Diagnosis not present

## 2016-02-03 DIAGNOSIS — J301 Allergic rhinitis due to pollen: Secondary | ICD-10-CM | POA: Diagnosis not present

## 2016-02-03 DIAGNOSIS — J3081 Allergic rhinitis due to animal (cat) (dog) hair and dander: Secondary | ICD-10-CM | POA: Diagnosis not present

## 2016-02-04 ENCOUNTER — Telehealth: Payer: Self-pay | Admitting: Genetic Counselor

## 2016-02-04 ENCOUNTER — Encounter: Payer: Self-pay | Admitting: Genetic Counselor

## 2016-02-04 DIAGNOSIS — Z1379 Encounter for other screening for genetic and chromosomal anomalies: Secondary | ICD-10-CM | POA: Insufficient documentation

## 2016-02-04 NOTE — Telephone Encounter (Signed)
Revealed CDH1 VUS but otherwise negative genetic testing.

## 2016-02-07 ENCOUNTER — Ambulatory Visit: Payer: Self-pay | Admitting: Genetic Counselor

## 2016-02-07 DIAGNOSIS — Z803 Family history of malignant neoplasm of breast: Secondary | ICD-10-CM

## 2016-02-07 DIAGNOSIS — Z8041 Family history of malignant neoplasm of ovary: Secondary | ICD-10-CM

## 2016-02-07 DIAGNOSIS — Z1379 Encounter for other screening for genetic and chromosomal anomalies: Secondary | ICD-10-CM

## 2016-02-07 DIAGNOSIS — Z800 Family history of malignant neoplasm of digestive organs: Secondary | ICD-10-CM

## 2016-02-07 DIAGNOSIS — Z808 Family history of malignant neoplasm of other organs or systems: Secondary | ICD-10-CM

## 2016-02-07 NOTE — Progress Notes (Signed)
HPI: Ms. Whidbee was previously seen in the [place] [facility] due to a family history of cancer and concerns regarding a hereditary predisposition to cancer. Please refer to our prior cancer genetics clinic note for more information regarding Ms. Leete's medical, social and family histories, and our assessment and recommendations, at the time. Ms. Wike recent genetic test results were disclosed to her, as were recommendations warranted by these results. These results and recommendations are discussed in more detail below.  CANCER HISTORY:   No history exists.    FAMILY HISTORY:  We obtained a detailed, 4-generation family history.  Significant diagnoses are listed below: Family History  Problem Relation Age of Onset  . Breast cancer Mother   . Osteoarthritis Mother   . Multiple sclerosis Mother   . Hypertension Father   . Lung cancer Father   . Stroke Father   . Heart disease Father     pacemaker  . Diabetes Sister   . Hypertension Sister   . Polymyositis Sister   . Diabetes Brother   . Hypertension Brother     The patient has two adopted daughters who are cancer free.   She has one full brother and sister who are cancer free, three maternal half siblings, two sisters and a brother, who are cancer free, and four paternal half siblings, two brotehrs and two issters who are cancer free.  The patient's mother had breast cancer at age 17 and died at 65.  Her mother had one brother and six sisters.  One sister had ovarian cancer and another had colon cancer.  The sister with ovarian cancer had a daughter with breast cancer in her 26s.  One brother had a daughter who had melanoma in her 23s.    The patient's father had lung cancer and died at 75.  No information is known about his family.  Patient's maternal ancestors are of [place] and [place] descent, and paternal ancestors are of [place] descent. There is no reported Ashkenazi Jewish ancestry. There is no known  consanguinity.  GENETIC TEST RESULTS: Genetic testing reported out on February 03, 2016 through the Common Hereditary cancer panel found no deleterious mutations.  The Hereditary Gene Panel offered by [facility] includes sequencing and/or deletion duplication testing of the following 43 genes: APC, ATM, AXIN2, BARD1, BMPR1A, BRCA1, BRCA2, BRIP1, CDH1, CDKN2A (p14ARF), CDKN2A (p16INK4a), CHEK2, DICER1, EPCAM (Deletion/duplication testing only), GREM1 (promoter region deletion/duplication testing only), KIT, MEN1, MLH1, MSH2, MSH6, MUTYH, NBN, NF1, PALB2, PDGFRA, PMS2, POLD1, POLE, PTEN, RAD50, RAD51C, RAD51D, SDHB, SDHC, SDHD, SMAD4, SMARCA4. STK11, TP53, TSC1, TSC2, and VHL.  The following gene was evaluated for sequence changes only: SDHA.  The test report has been scanned into EPIC and is located under the Molecular Pathology section of the Results Review tab.   We discussed with Ms. Purrington that since the current genetic testing is not perfect, it is possible there may be a gene mutation in one of these genes that current testing cannot detect, but that chance is small. We also discussed, that it is possible that another gene that has not yet been discovered, or that we have not yet tested, is responsible for the cancer diagnoses in the family, and it is, therefore, important to remain in touch with cancer genetics in the future so that we can continue to offer Ms. Dingwall the most up to date genetic testing.   Genetic testing did detect a Variant of Unknown Significance in the CDH1 gene called c.[id number]. At this  time, it is unknown if this variant is associated with increased cancer risk or if this is a normal finding, but most variants such as this get reclassified to being inconsequential. It should not be used to make medical management decisions. With time, we suspect the lab will determine the significance of this variant, if any. If we do learn more about it, we will try to contact Ms. Fickett to  discuss it further. However, it is important to stay in touch with [place] periodically and keep the address and phone number up to date.   CANCER SCREENING RECOMMENDATIONS: This normal result is reassuring and indicates that Ms. Nairn does not likely have an increased risk of cancer due to a mutation in one of these genes.  We, therefore, recommended  Ms. Henrikson continue to follow the cancer screening guidelines provided by her primary healthcare providers.   RECOMMENDATIONS FOR FAMILY MEMBERS: Women in this family might be at some increased risk of developing cancer, over the general population risk, simply due to the family history of cancer. We recommended women in this family have a yearly mammogram beginning at age 65, or 9 years younger than the earliest onset of cancer, an annual clinical breast exam, and perform monthly breast self-exams. Women in this family should also have a gynecological exam as recommended by their primary provider. All family members should have a colonoscopy by age 107.  Based on Ms. Kindle's family history, we recommended her maternal cousin, who was diagnosed with breast cancer in her 75s, have genetic counseling and testing. Ms. Hersman will let us know if we can be of any assistance in coordinating genetic counseling and/or testing for this family member.    FOLLOW-UP: Lastly, we discussed with Ms. Lartigue that cancer genetics is a rapidly advancing field and it is possible that new genetic tests will be appropriate for her and/or her family members in the future. We encouraged her to remain in contact with cancer genetics on an annual basis so we can update her personal and family histories and let her know of advances in cancer genetics that may benefit this family.   Our contact number was provided. Ms. Michna questions were answered to her satisfaction, and she knows she is welcome to call us at anytime with additional questions or concerns.   Roma Kayser, MS,  [facility] Certified Genetic Counselor [email or web address]

## 2016-02-25 DIAGNOSIS — J3081 Allergic rhinitis due to animal (cat) (dog) hair and dander: Secondary | ICD-10-CM | POA: Diagnosis not present

## 2016-02-25 DIAGNOSIS — J301 Allergic rhinitis due to pollen: Secondary | ICD-10-CM | POA: Diagnosis not present

## 2016-02-25 DIAGNOSIS — J3089 Other allergic rhinitis: Secondary | ICD-10-CM | POA: Diagnosis not present

## 2016-02-28 ENCOUNTER — Other Ambulatory Visit: Payer: Self-pay | Admitting: Internal Medicine

## 2016-02-28 MED FILL — EPINEPHRINE 0.3 MG AUTO-INJ: 0.3 | 30 days supply | Qty: 2 | Fill #0

## 2016-02-28 MED FILL — ANUSOL-HC 25 MG SUPPOSITORY: 25 | 30 days supply | Qty: 60 | Fill #0

## 2016-03-21 ENCOUNTER — Other Ambulatory Visit: Payer: Self-pay | Admitting: Internal Medicine

## 2016-03-21 NOTE — Telephone Encounter (Signed)
PLease advise, not on current med list.

## 2016-03-21 NOTE — Telephone Encounter (Signed)
I thought she went off of it.  Confirm with her if she is taking or not.

## 2016-03-22 DIAGNOSIS — J3089 Other allergic rhinitis: Secondary | ICD-10-CM | POA: Diagnosis not present

## 2016-03-22 DIAGNOSIS — J301 Allergic rhinitis due to pollen: Secondary | ICD-10-CM | POA: Diagnosis not present

## 2016-03-22 DIAGNOSIS — J3081 Allergic rhinitis due to animal (cat) (dog) hair and dander: Secondary | ICD-10-CM | POA: Diagnosis not present

## 2016-04-10 MED FILL — ESTRADIOL 0.025 MG PATCH: 0.025 | 84 days supply | Qty: 24 | Fill #0

## 2016-04-14 DIAGNOSIS — J301 Allergic rhinitis due to pollen: Secondary | ICD-10-CM | POA: Diagnosis not present

## 2016-04-14 DIAGNOSIS — J3081 Allergic rhinitis due to animal (cat) (dog) hair and dander: Secondary | ICD-10-CM | POA: Diagnosis not present

## 2016-04-14 DIAGNOSIS — H1045 Other chronic allergic conjunctivitis: Secondary | ICD-10-CM | POA: Diagnosis not present

## 2016-04-14 DIAGNOSIS — J3089 Other allergic rhinitis: Secondary | ICD-10-CM | POA: Diagnosis not present

## 2016-04-28 DIAGNOSIS — J301 Allergic rhinitis due to pollen: Secondary | ICD-10-CM | POA: Diagnosis not present

## 2016-04-28 DIAGNOSIS — J3089 Other allergic rhinitis: Secondary | ICD-10-CM | POA: Diagnosis not present

## 2016-04-28 DIAGNOSIS — J3081 Allergic rhinitis due to animal (cat) (dog) hair and dander: Secondary | ICD-10-CM | POA: Diagnosis not present

## 2016-05-15 ENCOUNTER — Encounter: Payer: Self-pay | Admitting: Internal Medicine

## 2016-05-15 DIAGNOSIS — Z1231 Encounter for screening mammogram for malignant neoplasm of breast: Secondary | ICD-10-CM | POA: Diagnosis not present

## 2016-05-15 DIAGNOSIS — M8589 Other specified disorders of bone density and structure, multiple sites: Secondary | ICD-10-CM | POA: Diagnosis not present

## 2016-05-15 DIAGNOSIS — Z803 Family history of malignant neoplasm of breast: Secondary | ICD-10-CM | POA: Diagnosis not present

## 2016-05-15 LAB — HM MAMMOGRAPHY

## 2016-05-17 ENCOUNTER — Encounter: Payer: Self-pay | Admitting: Internal Medicine

## 2016-05-17 DIAGNOSIS — J3081 Allergic rhinitis due to animal (cat) (dog) hair and dander: Secondary | ICD-10-CM | POA: Diagnosis not present

## 2016-05-17 DIAGNOSIS — J3089 Other allergic rhinitis: Secondary | ICD-10-CM | POA: Diagnosis not present

## 2016-05-17 DIAGNOSIS — J301 Allergic rhinitis due to pollen: Secondary | ICD-10-CM | POA: Diagnosis not present

## 2016-05-18 ENCOUNTER — Other Ambulatory Visit: Payer: Self-pay | Admitting: Internal Medicine

## 2016-05-27 ENCOUNTER — Telehealth: Payer: Self-pay | Admitting: Internal Medicine

## 2016-05-27 DIAGNOSIS — M85862 Other specified disorders of bone density and structure, left lower leg: Principal | ICD-10-CM

## 2016-05-27 DIAGNOSIS — M85861 Other specified disorders of bone density and structure, right lower leg: Secondary | ICD-10-CM

## 2016-05-27 NOTE — Telephone Encounter (Signed)
Let her know her recent dexa scan shows osteopenia.  There has been a slight decline since 2013.    Increase high calcium foods, 1000 units of vitamin d daily, regular weight bearing exercise  Repeat in two years

## 2016-05-29 MED ORDER — HYDROCORTISONE 2.5 % RE CREA: 1.0000 "application " | TOPICAL_CREAM | Freq: Two times a day (BID) | RECTAL | 0 refills | Status: DC

## 2016-05-29 NOTE — Telephone Encounter (Signed)
Spoke with pt to give results. Pt understood.   Pt is also having major problems with Hemorrhoids, does she need to schedule appt?

## 2016-05-29 NOTE — Telephone Encounter (Signed)
Please send cream to [facility]. States they are external and is okay with going to GI if cream does not help.

## 2016-05-29 NOTE — Telephone Encounter (Signed)
Cream sent

## 2016-05-29 NOTE — Telephone Encounter (Signed)
She has used the suppositories in the past - we can send more to her pharmacy if needed - these are for internal hemorrhoids.  If external we can try a cream  If she thinks she wants to pursue treatment such as banding we can refer to GI

## 2016-05-30 MED FILL — PROCTO-MED HC 2.5% CREAM: 2.5 | 15 days supply | Qty: 30 | Fill #0

## 2016-06-02 DIAGNOSIS — J3089 Other allergic rhinitis: Secondary | ICD-10-CM | POA: Diagnosis not present

## 2016-06-02 DIAGNOSIS — J3081 Allergic rhinitis due to animal (cat) (dog) hair and dander: Secondary | ICD-10-CM | POA: Diagnosis not present

## 2016-06-02 DIAGNOSIS — J301 Allergic rhinitis due to pollen: Secondary | ICD-10-CM | POA: Diagnosis not present

## 2016-06-06 MED FILL — ANUSOL-HC 25 MG SUPPOSITORY: 25 | 30 days supply | Qty: 60 | Fill #0

## 2016-06-16 DIAGNOSIS — J301 Allergic rhinitis due to pollen: Secondary | ICD-10-CM | POA: Diagnosis not present

## 2016-06-16 DIAGNOSIS — J3089 Other allergic rhinitis: Secondary | ICD-10-CM | POA: Diagnosis not present

## 2016-06-16 DIAGNOSIS — J3081 Allergic rhinitis due to animal (cat) (dog) hair and dander: Secondary | ICD-10-CM | POA: Diagnosis not present

## 2016-06-30 DIAGNOSIS — J3081 Allergic rhinitis due to animal (cat) (dog) hair and dander: Secondary | ICD-10-CM | POA: Diagnosis not present

## 2016-06-30 DIAGNOSIS — J301 Allergic rhinitis due to pollen: Secondary | ICD-10-CM | POA: Diagnosis not present

## 2016-06-30 DIAGNOSIS — J3089 Other allergic rhinitis: Secondary | ICD-10-CM | POA: Diagnosis not present

## 2016-07-13 DIAGNOSIS — J3081 Allergic rhinitis due to animal (cat) (dog) hair and dander: Secondary | ICD-10-CM | POA: Diagnosis not present

## 2016-07-13 DIAGNOSIS — J301 Allergic rhinitis due to pollen: Secondary | ICD-10-CM | POA: Diagnosis not present

## 2016-07-13 DIAGNOSIS — J3089 Other allergic rhinitis: Secondary | ICD-10-CM | POA: Diagnosis not present

## 2016-07-13 MED FILL — ESTRADIOL 0.025 MG PATCH: 0.025 | 84 days supply | Qty: 24 | Fill #1

## 2016-07-14 DIAGNOSIS — J301 Allergic rhinitis due to pollen: Secondary | ICD-10-CM | POA: Diagnosis not present

## 2016-07-18 DIAGNOSIS — J3089 Other allergic rhinitis: Secondary | ICD-10-CM | POA: Diagnosis not present

## 2016-07-18 DIAGNOSIS — J3081 Allergic rhinitis due to animal (cat) (dog) hair and dander: Secondary | ICD-10-CM | POA: Diagnosis not present

## 2016-08-02 DIAGNOSIS — J301 Allergic rhinitis due to pollen: Secondary | ICD-10-CM | POA: Diagnosis not present

## 2016-08-02 DIAGNOSIS — J3081 Allergic rhinitis due to animal (cat) (dog) hair and dander: Secondary | ICD-10-CM | POA: Diagnosis not present

## 2016-08-02 DIAGNOSIS — J3089 Other allergic rhinitis: Secondary | ICD-10-CM | POA: Diagnosis not present

## 2016-08-11 ENCOUNTER — Other Ambulatory Visit: Payer: Self-pay | Admitting: Internal Medicine

## 2016-08-11 DIAGNOSIS — J3081 Allergic rhinitis due to animal (cat) (dog) hair and dander: Secondary | ICD-10-CM | POA: Diagnosis not present

## 2016-08-11 DIAGNOSIS — J301 Allergic rhinitis due to pollen: Secondary | ICD-10-CM | POA: Diagnosis not present

## 2016-08-11 DIAGNOSIS — J3089 Other allergic rhinitis: Secondary | ICD-10-CM | POA: Diagnosis not present

## 2016-08-11 MED FILL — ANUSOL-HC 25 MG SUPPOSITORY: 25 | 30 days supply | Qty: 60 | Fill #0

## 2016-08-11 MED FILL — PROCTO-MED HC 2.5% CREAM: 2.5 | 15 days supply | Qty: 30 | Fill #0

## 2016-08-27 NOTE — Progress Notes (Signed)
Subjective:    Patient ID: Maria Kane, female    DOB: 03-26-1955, 61 y.o.   MRN: [id number]  HPI She is here for an acute visit.   Hair loss:  She has two older sisters with significant hair loss.  She is unsure if they have had it evaluated.  She is under a lot of stress.  She has noticed some receeding hair loss.  She always has a lot of hair come out I the shower and there has not been much change -  It does not seem excessive.  She has noticed hair loss in her eyebrow.  She takes an MVI, tumeric, B complex and vitamin D.  Osteopenia:  We reviewed her bone density.  She is exercising regularly - weights, bike.  She does not walk for exercise    Pain in medial aspect of left elbow.  It started a short time ago.  She is lifting weights.  She had increased pain when lifting weights.  She has tried to hold off on weight lifting.  Her daughter wants to be a boy and this is causing a lot of stress.    Insomnia:  Melatonin does not work. Has taken ativan in the past.  She would not take it nightly - only if she had not slept in a while.  She did not tolerate trazodone.   She continues to have her hemorrhoids.  She uses the prescription medication I gave her, but still feels she does not void completely.  She is concerned about rectal cancer. Her last colonoscopy was 8 years ago.     Medications and allergies reviewed with patient and updated if appropriate.  Patient Active Problem List   Diagnosis Date Noted  . Genetic testing 02/04/2016  . Family history of breast cancer   . Family history of ovarian cancer   . Family history of colon cancer   . Family history of melanoma   . Loose stools 09/28/2015  . Contusion of left knee 09/28/2015  . Solitary pulmonary nodule 12/25/2013  . DOE (dyspnea on exertion) 11/27/2013  . Palpitation 10/16/2013  . Hyperlipidemia 10/16/2013  . Chest pain 10/12/2013  . Hemorrhoid 07/25/2012  . Insomnia 02/08/2012  . Knee pain 03/24/2011  . Breast  lump on left side at 6 o'clock position 02/01/2011  . Single kidney   . Postmenopausal atrophic vaginitis 09/28/2010  . Allergic rhinitis   . NUMBNESS 01/05/2010  . Osteopenia 01/28/2008    Current Outpatient Prescriptions on File Prior to Visit  Medication Sig Dispense Refill  . ANUSOL-HC 25 MG suppository UNWRAP AND PLACE 1 SUPPOSITORY RECTALLY 2 TIMES DAILY AS NEEDED FOR HEMORRHOIDS OR ITCHING. 60 suppository 0  . estradiol (VIVELLE-DOT) 0.025 MG/24HR PLACE 1 PATCH ONTO THE SKIN 2 (TWO) TIMES A WEEK. 8 patch 7  . fexofenadine (ALLEGRA) 180 MG tablet Take 180 mg by mouth daily.    Marland Kitchen PROCTO-MED HC 2.5 % rectal cream APPLY 1 APPLICATION RECTALLY 2 TIMES DAILY. 30 g 0  . S-Adenosylmethionine (SAM-E PO) Take 1,200 mg by mouth daily. 1600 mg daily     No current facility-administered medications on file prior to visit.     Past Medical History:  Diagnosis Date  . Allergic rhinitis    dogs, dust, tree and ragweed  . Family history of breast cancer   . Family history of colon cancer   . Family history of melanoma   . Family history of ovarian cancer   . Heart palpitations  10/13/2013  . History of chickenpox   . Osteopenia    dexa 11/09 -1.4 spine, -1.2 hip  . Pineal gland cyst    incidental finding  . Primiparous   . Single kidney    left removed cong UPJ obstruction    Past Surgical History:  Procedure Laterality Date  . ABDOMINAL HYSTERECTOMY  1996   age 77 total severe endometriosis  . APPENDECTOMY  2006  . CHOLECYSTECTOMY  2001  . left kidney  2002   removed congential UPJ obstruction   . TONSILLECTOMY  1965    Social History   Social History  . Marital status: Married    Spouse name: N/A  . Number of children: 2  . Years of education: N/A   Occupational History  . Business Psychologist    Social History Main Topics  . Smoking status: Never Smoker  . Smokeless tobacco: Not on file  . Alcohol use 5.4 oz/week    9 Glasses of wine per week  . Drug use: No    . Sexual activity: Yes    Birth control/ protection: Surgical   Other Topics Concern  . Not on file   Social History Narrative   1/2 brother- schizopherenia   Occupation: Ph.D- Publishing rights manager   Married   [place] of 4   Regular exercise- yes, power walking   2 adopted kids 2 dogs   Originally from [place] History  Problem Relation Age of Onset  . Breast cancer Mother   . Osteoarthritis Mother   . Multiple sclerosis Mother   . Hypertension Father   . Lung cancer Father   . Stroke Father   . Heart disease Father        pacemaker  . Diabetes Sister   . Hypertension Sister   . Polymyositis Sister   . Diabetes Brother   . Hypertension Brother     Review of Systems  Constitutional: Positive for fatigue (from not sleeping well). Negative for chills and fever.  Musculoskeletal: Positive for arthralgias.  Skin: Negative for color change and rash.       Hair loss  Psychiatric/Behavioral: Positive for sleep disturbance. The patient is nervous/anxious.        Objective:   Vitals:   08/28/16 1537  BP: 130/76  Pulse: 77  Resp: 16  Temp: 98.1 F (36.7 C)   Filed Weights   08/28/16 1537  Weight: 147 lb (66.7 kg)   Body mass index is 24.46 kg/m.  Wt Readings from Last 3 Encounters:  08/28/16 147 lb (66.7 kg)  09/28/15 147 lb (66.7 kg)  08/13/15 147 lb (66.7 kg)     Physical Exam  Constitutional: She appears well-developed and well-nourished. No distress.  HENT:  Head: Normocephalic and atraumatic.  Musculoskeletal:  Left medial epicondyle tender to palpation and with movement, no swelling or skin change  Skin: Skin is warm and dry. No rash noted. She is not diaphoretic. No erythema.  Mild receeding hair line, eye brows thinner per patient - slight thinning on lateral aspects  Psychiatric: She has a normal mood and affect. Her behavior is normal. Judgment and thought content normal.          Assessment & Plan:   See Problem List for  Assessment and Plan of chronic medical problems.

## 2016-08-27 NOTE — Patient Instructions (Addendum)
  Test(s) ordered today. Your results will be released to [facility] (or called to you) after review, usually within 72hours after test completion. If any changes need to be made, you will be notified at that same time.  Medications reviewed and updated.  Changes include starting ativan as needed for sleep.

## 2016-08-28 ENCOUNTER — Encounter: Payer: Self-pay | Admitting: Internal Medicine

## 2016-08-28 ENCOUNTER — Other Ambulatory Visit (INDEPENDENT_AMBULATORY_CARE_PROVIDER_SITE_OTHER): Payer: 59

## 2016-08-28 ENCOUNTER — Ambulatory Visit (INDEPENDENT_AMBULATORY_CARE_PROVIDER_SITE_OTHER): Payer: 59 | Admitting: Internal Medicine

## 2016-08-28 VITALS — BP 130/76 | HR 77 | Temp 98.1°F | Resp 16 | Wt 147.0 lb

## 2016-08-28 DIAGNOSIS — M85862 Other specified disorders of bone density and structure, left lower leg: Secondary | ICD-10-CM | POA: Diagnosis not present

## 2016-08-28 DIAGNOSIS — G4700 Insomnia, unspecified: Secondary | ICD-10-CM

## 2016-08-28 DIAGNOSIS — K649 Unspecified hemorrhoids: Secondary | ICD-10-CM | POA: Diagnosis not present

## 2016-08-28 DIAGNOSIS — L659 Nonscarring hair loss, unspecified: Secondary | ICD-10-CM | POA: Diagnosis not present

## 2016-08-28 DIAGNOSIS — M85861 Other specified disorders of bone density and structure, right lower leg: Secondary | ICD-10-CM | POA: Diagnosis not present

## 2016-08-28 DIAGNOSIS — M25522 Pain in left elbow: Secondary | ICD-10-CM | POA: Diagnosis not present

## 2016-08-28 LAB — CBC WITH DIFFERENTIAL/PLATELET
Basophils Absolute: 0.1 10*3/uL (ref 0.0–0.1)
Basophils Relative: 0.9 % (ref 0.0–3.0)
Eosinophils Absolute: 0.2 10*3/uL (ref 0.0–0.7)
Eosinophils Relative: 2.1 % (ref 0.0–5.0)
HCT: 41 % (ref 36.0–46.0)
Hemoglobin: 13.9 g/dL (ref 12.0–15.0)
Lymphocytes Relative: 37.4 % (ref 12.0–46.0)
Lymphs Abs: 3.6 10*3/uL (ref 0.7–4.0)
MCHC: 33.9 g/dL (ref 30.0–36.0)
MCV: 97.7 fl (ref 78.0–100.0)
Monocytes Absolute: 0.8 10*3/uL (ref 0.1–1.0)
Monocytes Relative: 8.8 % (ref 3.0–12.0)
Neutro Abs: 4.9 10*3/uL (ref 1.4–7.7)
Neutrophils Relative %: 50.8 % (ref 43.0–77.0)
Platelets: 270 10*3/uL (ref 150.0–400.0)
RBC: 4.2 Mil/uL (ref 3.87–5.11)
RDW: 13 % (ref 11.5–15.5)
WBC: 9.6 10*3/uL (ref 4.0–10.5)

## 2016-08-28 LAB — VITAMIN B12: Vitamin B-12: 603 pg/mL (ref 211–911)

## 2016-08-28 LAB — COMPREHENSIVE METABOLIC PANEL WITH GFR
ALT: 16 U/L (ref 0–35)
AST: 16 U/L (ref 0–37)
Albumin: 4.5 g/dL (ref 3.5–5.2)
Alkaline Phosphatase: 77 U/L (ref 39–117)
BUN: 27 mg/dL — ABNORMAL HIGH (ref 6–23)
CO2: 27 meq/L (ref 19–32)
Calcium: 9.7 mg/dL (ref 8.4–10.5)
Chloride: 103 meq/L (ref 96–112)
Creatinine, Ser: 1.15 mg/dL (ref 0.40–1.20)
GFR: 51.25 mL/min — ABNORMAL LOW
Glucose, Bld: 100 mg/dL — ABNORMAL HIGH (ref 70–99)
Potassium: 4 meq/L (ref 3.5–5.1)
Sodium: 137 meq/L (ref 135–145)
Total Bilirubin: 0.4 mg/dL (ref 0.2–1.2)
Total Protein: 7.1 g/dL (ref 6.0–8.3)

## 2016-08-28 LAB — FERRITIN: Ferritin: 68.5 ng/mL (ref 10.0–291.0)

## 2016-08-28 LAB — IRON: Iron: 120 ug/dL (ref 42–145)

## 2016-08-28 LAB — TSH: TSH: 2.67 u[IU]/mL (ref 0.35–4.50)

## 2016-08-28 MED ORDER — LORAZEPAM 0.5 MG PO TABS: 0.5000 mg | ORAL_TABLET | Freq: Every evening | ORAL | 0 refills | Status: DC | PRN

## 2016-08-28 MED FILL — LORazepam 0.5 MG TABS: 0.5 | 30 days supply | Qty: 30 | Fill #0

## 2016-08-28 NOTE — Assessment & Plan Note (Signed)
Related to stress Trazodone - not tolerated otc meds not effective Will prescribe ativan - she has taken before - to use only as needed

## 2016-08-28 NOTE — Assessment & Plan Note (Signed)
Medial epicondylitis Possibly from weight lifting Revise activities Ice, rest Call if no improvement

## 2016-08-28 NOTE — Assessment & Plan Note (Signed)
Symptoms improved and controlled, but often feels she is not voiding completely - concerned about rectal cancer Will refer to GI - ? Early colonoscopy vs anoscope

## 2016-08-28 NOTE — Assessment & Plan Note (Signed)
Reviewed dexa  - has had sign dec but it has been 5 years Taking vitamin D Increase calcium in diet or start supplementation Try to add walking to exercise Recheck dexa in 2 years

## 2016-08-28 NOTE — Assessment & Plan Note (Signed)
Likely familial Will check labs to rule out other causes Can see her dermatologist Can use rogaine

## 2016-09-01 DIAGNOSIS — J3081 Allergic rhinitis due to animal (cat) (dog) hair and dander: Secondary | ICD-10-CM | POA: Diagnosis not present

## 2016-09-01 DIAGNOSIS — F419 Anxiety disorder, unspecified: Secondary | ICD-10-CM | POA: Diagnosis not present

## 2016-09-01 DIAGNOSIS — J3089 Other allergic rhinitis: Secondary | ICD-10-CM | POA: Diagnosis not present

## 2016-09-01 DIAGNOSIS — J301 Allergic rhinitis due to pollen: Secondary | ICD-10-CM | POA: Diagnosis not present

## 2016-09-03 ENCOUNTER — Encounter: Payer: Self-pay | Admitting: Internal Medicine

## 2016-09-03 DIAGNOSIS — R944 Abnormal results of kidney function studies: Secondary | ICD-10-CM

## 2016-09-25 DIAGNOSIS — J3089 Other allergic rhinitis: Secondary | ICD-10-CM | POA: Diagnosis not present

## 2016-09-25 DIAGNOSIS — J3081 Allergic rhinitis due to animal (cat) (dog) hair and dander: Secondary | ICD-10-CM | POA: Diagnosis not present

## 2016-09-25 DIAGNOSIS — J301 Allergic rhinitis due to pollen: Secondary | ICD-10-CM | POA: Diagnosis not present

## 2016-09-26 ENCOUNTER — Telehealth: Payer: Self-pay | Admitting: Internal Medicine

## 2016-09-26 NOTE — Telephone Encounter (Signed)
Patient received colorectal screening call.  Please follow up in regard.

## 2016-09-27 DIAGNOSIS — J3081 Allergic rhinitis due to animal (cat) (dog) hair and dander: Secondary | ICD-10-CM | POA: Diagnosis not present

## 2016-09-27 DIAGNOSIS — J3089 Other allergic rhinitis: Secondary | ICD-10-CM | POA: Diagnosis not present

## 2016-09-27 DIAGNOSIS — J301 Allergic rhinitis due to pollen: Secondary | ICD-10-CM | POA: Diagnosis not present

## 2016-09-28 NOTE — Telephone Encounter (Signed)
Spoke with Pt, informed her that she is not due for coloscopy until 2020. Scheduled CPE for Oct, will discuss then.

## 2016-10-02 DIAGNOSIS — J3081 Allergic rhinitis due to animal (cat) (dog) hair and dander: Secondary | ICD-10-CM | POA: Diagnosis not present

## 2016-10-02 DIAGNOSIS — J301 Allergic rhinitis due to pollen: Secondary | ICD-10-CM | POA: Diagnosis not present

## 2016-10-02 DIAGNOSIS — J3089 Other allergic rhinitis: Secondary | ICD-10-CM | POA: Diagnosis not present

## 2016-10-04 DIAGNOSIS — J3081 Allergic rhinitis due to animal (cat) (dog) hair and dander: Secondary | ICD-10-CM | POA: Diagnosis not present

## 2016-10-04 DIAGNOSIS — J3089 Other allergic rhinitis: Secondary | ICD-10-CM | POA: Diagnosis not present

## 2016-10-04 DIAGNOSIS — J301 Allergic rhinitis due to pollen: Secondary | ICD-10-CM | POA: Diagnosis not present

## 2016-10-09 ENCOUNTER — Other Ambulatory Visit: Payer: Self-pay | Admitting: Internal Medicine

## 2016-10-09 DIAGNOSIS — J3089 Other allergic rhinitis: Secondary | ICD-10-CM | POA: Diagnosis not present

## 2016-10-09 DIAGNOSIS — J3081 Allergic rhinitis due to animal (cat) (dog) hair and dander: Secondary | ICD-10-CM | POA: Diagnosis not present

## 2016-10-09 DIAGNOSIS — J301 Allergic rhinitis due to pollen: Secondary | ICD-10-CM | POA: Diagnosis not present

## 2016-10-09 MED FILL — ESTRADIOL 0.025 MG PATCH: 0.025 | 84 days supply | Qty: 24 | Fill #0

## 2016-10-11 DIAGNOSIS — F419 Anxiety disorder, unspecified: Secondary | ICD-10-CM | POA: Diagnosis not present

## 2016-10-13 ENCOUNTER — Encounter: Payer: Self-pay | Admitting: Internal Medicine

## 2016-10-18 DIAGNOSIS — J301 Allergic rhinitis due to pollen: Secondary | ICD-10-CM | POA: Diagnosis not present

## 2016-10-18 DIAGNOSIS — J3081 Allergic rhinitis due to animal (cat) (dog) hair and dander: Secondary | ICD-10-CM | POA: Diagnosis not present

## 2016-10-18 DIAGNOSIS — J3089 Other allergic rhinitis: Secondary | ICD-10-CM | POA: Diagnosis not present

## 2016-10-30 ENCOUNTER — Other Ambulatory Visit: Payer: Self-pay | Admitting: Internal Medicine

## 2016-10-30 DIAGNOSIS — J301 Allergic rhinitis due to pollen: Secondary | ICD-10-CM | POA: Diagnosis not present

## 2016-10-30 DIAGNOSIS — J3081 Allergic rhinitis due to animal (cat) (dog) hair and dander: Secondary | ICD-10-CM | POA: Diagnosis not present

## 2016-10-30 DIAGNOSIS — J3089 Other allergic rhinitis: Secondary | ICD-10-CM | POA: Diagnosis not present

## 2016-10-30 MED FILL — HYDROCORTISONE AC 25 MG SUP: 25 | 30 days supply | Qty: 60 | Fill #0

## 2016-11-16 ENCOUNTER — Ambulatory Visit: Payer: 59

## 2016-11-20 ENCOUNTER — Other Ambulatory Visit: Payer: Self-pay | Admitting: Emergency Medicine

## 2016-11-20 ENCOUNTER — Ambulatory Visit (INDEPENDENT_AMBULATORY_CARE_PROVIDER_SITE_OTHER): Payer: 59 | Admitting: Emergency Medicine

## 2016-11-20 ENCOUNTER — Other Ambulatory Visit (INDEPENDENT_AMBULATORY_CARE_PROVIDER_SITE_OTHER): Payer: 59

## 2016-11-20 DIAGNOSIS — Z0000 Encounter for general adult medical examination without abnormal findings: Secondary | ICD-10-CM | POA: Diagnosis not present

## 2016-11-20 DIAGNOSIS — E7849 Other hyperlipidemia: Secondary | ICD-10-CM

## 2016-11-20 DIAGNOSIS — J3089 Other allergic rhinitis: Secondary | ICD-10-CM | POA: Diagnosis not present

## 2016-11-20 DIAGNOSIS — J3081 Allergic rhinitis due to animal (cat) (dog) hair and dander: Secondary | ICD-10-CM | POA: Diagnosis not present

## 2016-11-20 DIAGNOSIS — J301 Allergic rhinitis due to pollen: Secondary | ICD-10-CM | POA: Diagnosis not present

## 2016-11-20 DIAGNOSIS — Z23 Encounter for immunization: Secondary | ICD-10-CM | POA: Diagnosis not present

## 2016-11-20 LAB — CBC WITH DIFFERENTIAL/PLATELET
BASOS ABS: 0.1 10*3/uL (ref 0.0–0.1)
Basophils Relative: 1.2 % (ref 0.0–3.0)
EOS ABS: 0.2 10*3/uL (ref 0.0–0.7)
Eosinophils Relative: 3.6 % (ref 0.0–5.0)
HEMATOCRIT: 40.3 % (ref 36.0–46.0)
HEMOGLOBIN: 13.7 g/dL (ref 12.0–15.0)
LYMPHS PCT: 41.2 % (ref 12.0–46.0)
Lymphs Abs: 2.6 10*3/uL (ref 0.7–4.0)
MCHC: 33.9 g/dL (ref 30.0–36.0)
MCV: 98.9 fl (ref 78.0–100.0)
Monocytes Absolute: 0.6 10*3/uL (ref 0.1–1.0)
Monocytes Relative: 8.7 % (ref 3.0–12.0)
Neutro Abs: 2.9 10*3/uL (ref 1.4–7.7)
Neutrophils Relative %: 45.3 % (ref 43.0–77.0)
PLATELETS: 256 10*3/uL (ref 150.0–400.0)
RBC: 4.07 Mil/uL (ref 3.87–5.11)
RDW: 13 % (ref 11.5–15.5)
WBC: 6.4 10*3/uL (ref 4.0–10.5)

## 2016-11-20 LAB — LIPID PANEL
CHOL/HDL RATIO: 3
Cholesterol: 189 mg/dL (ref 0–200)
HDL: 60.2 mg/dL (ref >39.00)
LDL CALC: 106 mg/dL — AB (ref 0–99)
NonHDL: 128.83
TRIGLYCERIDES: 114 mg/dL (ref 0.0–149.0)
VLDL: 22.8 mg/dL (ref 0.0–40.0)

## 2016-11-20 LAB — COMPREHENSIVE METABOLIC PANEL
ALT: 20 U/L (ref 0–35)
AST: 17 U/L (ref 0–37)
Albumin: 4.3 g/dL (ref 3.5–5.2)
Alkaline Phosphatase: 63 U/L (ref 39–117)
BILIRUBIN TOTAL: 0.7 mg/dL (ref 0.2–1.2)
BUN: 14 mg/dL (ref 6–23)
CALCIUM: 9.5 mg/dL (ref 8.4–10.5)
CO2: 24 meq/L (ref 19–32)
Chloride: 107 mEq/L (ref 96–112)
Creatinine, Ser: 0.87 mg/dL (ref 0.40–1.20)
GFR: 70.66 mL/min (ref >60.00)
Glucose, Bld: 102 mg/dL — ABNORMAL HIGH (ref 70–99)
Potassium: 5.1 mEq/L (ref 3.5–5.1)
Sodium: 141 mEq/L (ref 135–145)
Total Protein: 6.6 g/dL (ref 6.0–8.3)

## 2016-11-21 ENCOUNTER — Encounter: Payer: Self-pay | Admitting: Internal Medicine

## 2016-11-28 ENCOUNTER — Ambulatory Visit (INDEPENDENT_AMBULATORY_CARE_PROVIDER_SITE_OTHER): Payer: 59 | Admitting: Internal Medicine

## 2016-11-28 ENCOUNTER — Telehealth: Payer: Self-pay | Admitting: Internal Medicine

## 2016-11-28 ENCOUNTER — Encounter: Payer: Self-pay | Admitting: Internal Medicine

## 2016-11-28 VITALS — BP 136/78 | HR 91 | Temp 98.1°F | Resp 16 | Ht 65.0 in | Wt 147.0 lb

## 2016-11-28 DIAGNOSIS — G4700 Insomnia, unspecified: Secondary | ICD-10-CM

## 2016-11-28 DIAGNOSIS — Z0000 Encounter for general adult medical examination without abnormal findings: Secondary | ICD-10-CM | POA: Diagnosis not present

## 2016-11-28 DIAGNOSIS — K649 Unspecified hemorrhoids: Secondary | ICD-10-CM | POA: Diagnosis not present

## 2016-11-28 MED ORDER — SUVOREXANT 10 MG PO TABS: 10.0000 mg | ORAL_TABLET | Freq: Every evening | ORAL | 2 refills | Status: DC

## 2016-11-28 NOTE — Patient Instructions (Addendum)
No immunizations administered today.   Medications reviewed and updated.  Changes include starting belsomra for sleep.   Your prescription(s) have been submitted to your pharmacy. Please take as directed and contact our office if you believe you are having problem(s) with the medication(s).  A referral was ordered for  GI  Please followup in one year   Health Maintenance, Female Adopting a healthy lifestyle and getting preventive care can go a long way to promote health and wellness. Talk with your health care provider about what schedule of regular examinations is right for you. This is a good chance for you to check in with your provider about disease prevention and staying healthy. In between checkups, there are plenty of things you can do on your own. Experts have done a lot of research about which lifestyle changes and preventive measures are most likely to keep you healthy. Ask your health care provider for more information. Weight and diet Eat a healthy diet  Be sure to include plenty of vegetables, fruits, low-fat dairy products, and lean protein.  Do not eat a lot of foods high in solid fats, added sugars, or salt.  Get regular exercise. This is one of the most important things you can do for your health. ? Most adults should exercise for at least 150 minutes each week. The exercise should increase your heart rate and make you sweat (moderate-intensity exercise). ? Most adults should also do strengthening exercises at least twice a week. This is in addition to the moderate-intensity exercise.  Maintain a healthy weight  Body mass index (BMI) is a measurement that can be used to identify possible weight problems. It estimates body fat based on height and weight. Your health care provider can help determine your BMI and help you achieve or maintain a healthy weight.  For females 19 years of age and older: ? A BMI below 18.5 is considered underweight. ? A BMI of 18.5 to 24.9 is  normal. ? A BMI of 25 to 29.9 is considered overweight. ? A BMI of 30 and above is considered obese.  Watch levels of cholesterol and blood lipids  You should start having your blood tested for lipids and cholesterol at 59 years of age, then have this test every 5 years.  You may need to have your cholesterol levels checked more often if: ? Your lipid or cholesterol levels are high. ? You are older than 59 years of age. ? You are at high risk for heart disease.  Cancer screening Lung Cancer  Lung cancer screening is recommended for adults 75-58 years old who are at high risk for lung cancer because of a history of smoking.  A yearly low-dose CT scan of the lungs is recommended for people who: ? Currently smoke. ? Have quit within the past 15 years. ? Have at least a 30-pack-year history of smoking. A pack year is smoking an average of one pack of cigarettes a day for 1 year.  Yearly screening should continue until it has been 15 years since you quit.  Yearly screening should stop if you develop a health problem that would prevent you from having lung cancer treatment.  Breast Cancer  Practice breast self-awareness. This means understanding how your breasts normally appear and feel.  It also means doing regular breast self-exams. Let your health care provider know about any changes, no matter how small.  If you are in your 20s or 30s, you should have a clinical breast exam (CBE) by  a health care provider every 1-3 years as part of a regular health exam.  If you are 42 or older, have a CBE every year. Also consider having a breast X-ray (mammogram) every year.  If you have a family history of breast cancer, talk to your health care provider about genetic screening.  If you are at high risk for breast cancer, talk to your health care provider about having an MRI and a mammogram every year.  Breast cancer gene (BRCA) assessment is recommended for women who have family members  with BRCA-related cancers. BRCA-related cancers include: ? Breast. ? Ovarian. ? Tubal. ? Peritoneal cancers.  Results of the assessment will determine the need for genetic counseling and BRCA1 and BRCA2 testing.  Cervical Cancer Your health care provider may recommend that you be screened regularly for cancer of the pelvic organs (ovaries, uterus, and vagina). This screening involves a pelvic examination, including checking for microscopic changes to the surface of your cervix (Pap test). You may be encouraged to have this screening done every 3 years, beginning at age 29.  For women ages 29-65, health care providers may recommend pelvic exams and Pap testing every 3 years, or they may recommend the Pap and pelvic exam, combined with testing for human papilloma virus (HPV), every 5 years. Some types of HPV increase your risk of cervical cancer. Testing for HPV may also be done on women of any age with unclear Pap test results.  Other health care providers may not recommend any screening for nonpregnant women who are considered low risk for pelvic cancer and who do not have symptoms. Ask your health care provider if a screening pelvic exam is right for you.  If you have had past treatment for cervical cancer or a condition that could lead to cancer, you need Pap tests and screening for cancer for at least 20 years after your treatment. If Pap tests have been discontinued, your risk factors (such as having a new sexual partner) need to be reassessed to determine if screening should resume. Some women have medical problems that increase the chance of getting cervical cancer. In these cases, your health care provider may recommend more frequent screening and Pap tests.  Colorectal Cancer  This type of cancer can be detected and often prevented.  Routine colorectal cancer screening usually begins at 59 years of age and continues through 59 years of age.  Your health care provider may recommend  screening at an earlier age if you have risk factors for colon cancer.  Your health care provider may also recommend using home test kits to check for hidden blood in the stool.  A small camera at the end of a tube can be used to examine your colon directly (sigmoidoscopy or colonoscopy). This is done to check for the earliest forms of colorectal cancer.  Routine screening usually begins at age 62.  Direct examination of the colon should be repeated every 5-10 years through 59 years of age. However, you may need to be screened more often if early forms of precancerous polyps or small growths are found.  Skin Cancer  Check your skin from head to toe regularly.  Tell your health care provider about any new moles or changes in moles, especially if there is a change in a mole's shape or color.  Also tell your health care provider if you have a mole that is larger than the size of a pencil eraser.  Always use sunscreen. Apply sunscreen liberally and repeatedly  throughout the day.  Protect yourself by wearing long sleeves, pants, a wide-brimmed hat, and sunglasses whenever you are outside.  Heart disease, diabetes, and high blood pressure  High blood pressure causes heart disease and increases the risk of stroke. High blood pressure is more likely to develop in: ? People who have blood pressure in the high end of the normal range (130-139/85-89 mm Hg). ? People who are overweight or obese. ? People who are African American.  If you are 19-35 years of age, have your blood pressure checked every 3-5 years. If you are 8 years of age or older, have your blood pressure checked every year. You should have your blood pressure measured twice-once when you are at a hospital or clinic, and once when you are not at a hospital or clinic. Record the average of the two measurements. To check your blood pressure when you are not at a hospital or clinic, you can use: ? An automated blood pressure machine at  a pharmacy. ? A home blood pressure monitor.  If you are between 31 years and 80 years old, ask your health care provider if you should take aspirin to prevent strokes.  Have regular diabetes screenings. This involves taking a blood sample to check your fasting blood sugar level. ? If you are at a normal weight and have a low risk for diabetes, have this test once every three years after 59 years of age. ? If you are overweight and have a high risk for diabetes, consider being tested at a younger age or more often. Preventing infection Hepatitis B  If you have a higher risk for hepatitis B, you should be screened for this virus. You are considered at high risk for hepatitis B if: ? You were born in a country where hepatitis B is common. Ask your health care provider which countries are considered high risk. ? Your parents were born in a high-risk country, and you have not been immunized against hepatitis B (hepatitis B vaccine). ? You have HIV or AIDS. ? You use needles to inject street drugs. ? You live with someone who has hepatitis B. ? You have had sex with someone who has hepatitis B. ? You get hemodialysis treatment. ? You take certain medicines for conditions, including cancer, organ transplantation, and autoimmune conditions.  Hepatitis C  Blood testing is recommended for: ? Everyone born from [birth year] through 1963. ? Anyone with known risk factors for hepatitis C.  Sexually transmitted infections (STIs)  You should be screened for sexually transmitted infections (STIs) including gonorrhea and chlamydia if: ? You are sexually active and are younger than 59 years of age. ? You are older than 59 years of age and your health care provider tells you that you are at risk for this type of infection. ? Your sexual activity has changed since you were last screened and you are at an increased risk for chlamydia or gonorrhea. Ask your health care provider if you are at risk.  If you do  not have HIV, but are at risk, it may be recommended that you take a prescription medicine daily to prevent HIV infection. This is called pre-exposure prophylaxis (PrEP). You are considered at risk if: ? You are sexually active and do not regularly use condoms or know the HIV status of your partner(s). ? You take drugs by injection. ? You are sexually active with a partner who has HIV.  Talk with your health care provider about whether you are  at high risk of being infected with HIV. If you choose to begin PrEP, you should first be tested for HIV. You should then be tested every 3 months for as long as you are taking PrEP. Pregnancy  If you are premenopausal and you may become pregnant, ask your health care provider about preconception counseling.  If you may become pregnant, take 400 to 800 micrograms (mcg) of folic acid every day.  If you want to prevent pregnancy, talk to your health care provider about birth control (contraception). Osteoporosis and menopause  Osteoporosis is a disease in which the bones lose minerals and strength with aging. This can result in serious bone fractures. Your risk for osteoporosis can be identified using a bone density scan.  If you are 32 years of age or older, or if you are at risk for osteoporosis and fractures, ask your health care provider if you should be screened.  Ask your health care provider whether you should take a calcium or vitamin D supplement to lower your risk for osteoporosis.  Menopause may have certain physical symptoms and risks.  Hormone replacement therapy may reduce some of these symptoms and risks. Talk to your health care provider about whether hormone replacement therapy is right for you. Follow these instructions at home:  Schedule regular health, dental, and eye exams.  Stay current with your immunizations.  Do not use any tobacco products including cigarettes, chewing tobacco, or electronic cigarettes.  If you are  pregnant, do not drink alcohol.  If you are breastfeeding, limit how much and how often you drink alcohol.  Limit alcohol intake to no more than 1 drink per day for nonpregnant women. One drink equals 12 ounces of beer, 5 ounces of wine, or 1 ounces of hard liquor.  Do not use street drugs.  Do not share needles.  Ask your health care provider for help if you need support or information about quitting drugs.  Tell your health care provider if you often feel depressed.  Tell your health care provider if you have ever been abused or do not feel safe at home. This information is not intended to replace advice given to you by your health care provider. Make sure you discuss any questions you have with your health care provider. Document Released: 08/22/2010 Document Revised: 07/15/2015 Document Reviewed: 11/10/2014 Elsevier Interactive Patient Education  [employer].   Psychiatry names Dr Letta Moynahan [address], [place], [place] [address] [phone]  [facility]  [address] [place], [place]  [facility]           [address] [place], [place]

## 2016-11-28 NOTE — Telephone Encounter (Signed)
Suvorexant (BELSOMRA) 10 MG TABS should have been sent to [facility].

## 2016-11-28 NOTE — Assessment & Plan Note (Signed)
Still having irritation symptoms, no longer bleeding Needing to use cream/suppositories daily Will refer to GI for further eval Last colonoscopy 2010

## 2016-11-28 NOTE — Progress Notes (Signed)
Subjective:    Patient ID: Maria Kane, female    DOB: November 12, 1955, 62 y.o.   MRN: [id number]  HPI She is here for a physical exam.   She takes ativan once a week or once every other week.  She is not sleeping.  She wakes up anxious - she is concerned about her daughter.  She wondered if there was something safe and would not affect her memory and not addicting.  Trazodone did not help - it gave her diarrhea.    Medications and allergies reviewed with patient and updated if appropriate.  Patient Active Problem List   Diagnosis Date Noted  . Hair loss 08/28/2016  . Elbow pain, left 08/28/2016  . Genetic testing 02/04/2016  . Family history of breast cancer   . Family history of ovarian cancer   . Family history of colon cancer   . Family history of melanoma   . Loose stools 09/28/2015  . Contusion of left knee 09/28/2015  . Solitary pulmonary nodule 12/25/2013  . DOE (dyspnea on exertion) 11/27/2013  . Palpitation 10/16/2013  . Hyperlipidemia 10/16/2013  . Chest pain 10/12/2013  . Hemorrhoid 07/25/2012  . Insomnia 02/08/2012  . Knee pain 03/24/2011  . Breast lump on left side at 6 o'clock position 02/01/2011  . Single kidney   . Postmenopausal atrophic vaginitis 09/28/2010  . Allergic rhinitis   . NUMBNESS 01/05/2010  . Osteopenia 01/28/2008    Current Outpatient Prescriptions on File Prior to Visit  Medication Sig Dispense Refill  . ANUSOL-HC 25 MG suppository UNWRAP AND INSERT 1 SUPPOSITORY RECTALLY 2 TIMES DAILY AS NEEDED FOR HEMORRHOIDS OR ITCHING. 60 suppository 0  . estradiol (VIVELLE-DOT) 0.025 MG/24HR PLACE 1 PATCH ONTO THE SKIN 2 TIMES A WEEK. 8 patch 7  . fexofenadine (ALLEGRA) 180 MG tablet Take 180 mg by mouth daily.    Marland Kitchen LORazepam (ATIVAN) 0.5 MG tablet Take 1 tablet (0.5 mg total) by mouth at bedtime as needed for anxiety or sleep. 30 tablet 0  . PROCTO-MED HC 2.5 % rectal cream APPLY 1 APPLICATION RECTALLY 2 TIMES DAILY. 30 g 0  . S-Adenosylmethionine  (SAM-E PO) Take 1,200 mg by mouth daily. 1600 mg daily     No current facility-administered medications on file prior to visit.     Past Medical History:  Diagnosis Date  . Allergic rhinitis    dogs, dust, tree and ragweed  . Family history of breast cancer   . Family history of colon cancer   . Family history of melanoma   . Family history of ovarian cancer   . Heart palpitations 10/13/2013  . History of chickenpox   . Osteopenia    dexa 11/09 -1.4 spine, -1.2 hip  . Pineal gland cyst    incidental finding  . Primiparous   . Single kidney    left removed cong UPJ obstruction    Past Surgical History:  Procedure Laterality Date  . ABDOMINAL HYSTERECTOMY  1996   age 84 total severe endometriosis  . APPENDECTOMY  2006  . CHOLECYSTECTOMY  2001  . left kidney  2002   removed congential UPJ obstruction   . TONSILLECTOMY  1965    Social History   Social History  . Marital status: Married    Spouse name: N/A  . Number of children: 2  . Years of education: N/A   Occupational History  . Business Psychologist    Social History Main Topics  . Smoking status: Never Smoker  .  Smokeless tobacco: Never Used  . Alcohol use 5.4 oz/week    9 Glasses of wine per week  . Drug use: No  . Sexual activity: Yes    Birth control/ protection: Surgical   Other Topics Concern  . None   Social History Narrative   1/2 brother- schizopherenia   Occupation: Ph.D- Publishing rights manager   Married   HH of 4   Regular exercise- yes, power walking   2 adopted kids 2 dogs   Originally from [place]    Family History  Problem Relation Age of Onset  . Breast cancer Mother   . Osteoarthritis Mother   . Multiple sclerosis Mother   . Hypertension Father   . Lung cancer Father   . Stroke Father   . Heart disease Father        pacemaker  . Diabetes Sister   . Hypertension Sister   . Polymyositis Sister   . Diabetes Brother   . Hypertension Brother     Review of Systems    Constitutional: Negative for chills and fever.  Eyes: Negative for visual disturbance.  Respiratory: Negative for cough, shortness of breath and wheezing.   Cardiovascular: Negative for chest pain, palpitations and leg swelling.  Gastrointestinal: Negative for abdominal pain, blood in stool, constipation, diarrhea and nausea.       No gerd  Genitourinary: Negative for dysuria and hematuria.  Musculoskeletal: Negative for arthralgias and back pain.  Skin: Negative for color change and rash.  Neurological: Positive for dizziness (intermittent) and light-headedness. Negative for headaches.  Psychiatric/Behavioral: Positive for dysphoric mood and sleep disturbance. The patient is nervous/anxious.        Objective:   Vitals:   11/28/16 1509  BP: 136/78  Pulse: 91  Resp: 16  Temp: 98.1 F (36.7 C)  SpO2: 98%   Filed Weights   11/28/16 1509  Weight: 147 lb (66.7 kg)   Body mass index is 24.46 kg/m.  Wt Readings from Last 3 Encounters:  11/28/16 147 lb (66.7 kg)  08/28/16 147 lb (66.7 kg)  09/28/15 147 lb (66.7 kg)     Physical Exam Constitutional: She appears well-developed and well-nourished. No distress.  HENT:  Head: Normocephalic and atraumatic.  Right Ear: External ear normal. Normal ear canal and TM Left Ear: External ear normal.  Normal ear canal and TM Mouth/Throat: Oropharynx is clear and moist.  Eyes: Conjunctivae and EOM are normal.  Neck: Neck supple. No tracheal deviation present. No thyromegaly present.  No carotid bruit  Cardiovascular: Normal rate, regular rhythm and normal heart sounds.   No murmur heard.  No edema. Pulmonary/Chest: Effort normal and breath sounds normal. No respiratory distress. She has no wheezes. She has no rales.  Breast: deferred to Gyn Abdominal: Soft. She exhibits no distension. There is no tenderness.  Lymphadenopathy: She has no cervical adenopathy.  Skin: Skin is warm and dry. She is not diaphoretic.  Psychiatric: She has a  normal mood and affect. Her behavior is normal.        Assessment & Plan:   Physical exam: Screening blood work  Done, reviewed Immunizations   Up to date, discussed shingrix Colonoscopy  Up to date  Mammogram   Up to date  Gyn  Up to date  Dexa   Up to date  Eye exams  Due -- will schedule EKG   Last done 2015 Exercise  regular Weight  Weight stable - trying to lose weight -discussed decreasing alcohol intake Skin  No concerns,  sees derm Substance abuse  none  See Problem List for Assessment and Plan of chronic medical problems.

## 2016-11-28 NOTE — Assessment & Plan Note (Signed)
Not sleeping Decrease alcohol intake Melatonin not effective, trazodone caused diarrhea Trial of belsomra Will start seeing a therapist Continue regular exercise

## 2016-11-28 NOTE — Telephone Encounter (Signed)
RX sent

## 2016-11-29 ENCOUNTER — Encounter: Payer: Self-pay | Admitting: Gastroenterology

## 2016-12-04 DIAGNOSIS — J3081 Allergic rhinitis due to animal (cat) (dog) hair and dander: Secondary | ICD-10-CM | POA: Diagnosis not present

## 2016-12-04 DIAGNOSIS — J301 Allergic rhinitis due to pollen: Secondary | ICD-10-CM | POA: Diagnosis not present

## 2016-12-04 DIAGNOSIS — J3089 Other allergic rhinitis: Secondary | ICD-10-CM | POA: Diagnosis not present

## 2016-12-07 ENCOUNTER — Telehealth: Payer: Self-pay | Admitting: Emergency Medicine

## 2016-12-07 NOTE — Telephone Encounter (Signed)
PA completed for Belsomra. Key [id number]

## 2016-12-13 NOTE — Telephone Encounter (Signed)
Let her know she needs to try sonata and [place]

## 2016-12-13 NOTE — Telephone Encounter (Signed)
PA denied. Pt needs to try Zolpidem, zaleplon, or eszopiclone. Please advise.

## 2016-12-14 NOTE — Telephone Encounter (Signed)
Pt is going to research the two beds but would like to know which you prefer.

## 2016-12-14 NOTE — Telephone Encounter (Signed)
I would recommend trying the Lunesta first.

## 2016-12-18 MED ORDER — ESZOPICLONE 2 MG PO TABS: 2.0000 mg | ORAL_TABLET | Freq: Every evening | ORAL | 0 refills | Status: DC | PRN

## 2016-12-18 NOTE — Telephone Encounter (Signed)
RX faxed to [facility] 

## 2016-12-18 NOTE — Telephone Encounter (Signed)
Spoke with pt, Please send RX to [facility].

## 2016-12-18 NOTE — Telephone Encounter (Signed)
Prescription printed

## 2016-12-20 DIAGNOSIS — J3081 Allergic rhinitis due to animal (cat) (dog) hair and dander: Secondary | ICD-10-CM | POA: Diagnosis not present

## 2016-12-20 DIAGNOSIS — J3089 Other allergic rhinitis: Secondary | ICD-10-CM | POA: Diagnosis not present

## 2016-12-20 DIAGNOSIS — J301 Allergic rhinitis due to pollen: Secondary | ICD-10-CM | POA: Diagnosis not present

## 2017-01-03 DIAGNOSIS — J301 Allergic rhinitis due to pollen: Secondary | ICD-10-CM | POA: Diagnosis not present

## 2017-01-03 DIAGNOSIS — J3089 Other allergic rhinitis: Secondary | ICD-10-CM | POA: Diagnosis not present

## 2017-01-03 DIAGNOSIS — J3081 Allergic rhinitis due to animal (cat) (dog) hair and dander: Secondary | ICD-10-CM | POA: Diagnosis not present

## 2017-01-09 DIAGNOSIS — L638 Other alopecia areata: Secondary | ICD-10-CM | POA: Diagnosis not present

## 2017-01-09 MED FILL — ESTRADIOL 0.025 MG PATCH: 0.025 | 84 days supply | Qty: 24 | Fill #1

## 2017-01-09 MED FILL — ESZOPICLONE 2 MG TAB: 2 | 30 days supply | Qty: 30 | Fill #0

## 2017-01-09 MED FILL — HYDROCORTISONE 2.5% CREAM: 2.5 | 30 days supply | Qty: 30 | Fill #0

## 2017-01-18 NOTE — Progress Notes (Deleted)
Subjective:    Patient ID: Maria Kane, female    DOB: 03/27/1955, 61 y.o.   MRN: [id number]  HPI She is here for an acute visit.   Dizziness:  Medications and allergies reviewed with patient and updated if appropriate.  Patient Active Problem List   Diagnosis Date Noted  . Hair loss 08/28/2016  . Genetic testing 02/04/2016  . Family history of breast cancer   . Family history of ovarian cancer   . Family history of colon cancer   . Family history of melanoma   . Solitary pulmonary nodule 12/25/2013  . DOE (dyspnea on exertion) 11/27/2013  . Palpitation 10/16/2013  . Hyperlipidemia 10/16/2013  . Hemorrhoid 07/25/2012  . Insomnia 02/08/2012  . Breast lump on left side at 6 o'clock position 02/01/2011  . Single kidney   . Postmenopausal atrophic vaginitis 09/28/2010  . Allergic rhinitis   . NUMBNESS 01/05/2010  . Osteopenia 01/28/2008    Current Outpatient Medications on File Prior to Visit  Medication Sig Dispense Refill  . ANUSOL-HC 25 MG suppository UNWRAP AND INSERT 1 SUPPOSITORY RECTALLY 2 TIMES DAILY AS NEEDED FOR HEMORRHOIDS OR ITCHING. 60 suppository 0  . estradiol (VIVELLE-DOT) 0.025 MG/24HR PLACE 1 PATCH ONTO THE SKIN 2 TIMES A WEEK. 8 patch 7  . eszopiclone (LUNESTA) 2 MG TABS tablet Take 1 tablet (2 mg total) by mouth at bedtime as needed for sleep. Take immediately before bedtime 30 tablet 0  . fexofenadine (ALLEGRA) 180 MG tablet Take 180 mg by mouth daily.    Marland Kitchen PROCTO-MED HC 2.5 % rectal cream APPLY 1 APPLICATION RECTALLY 2 TIMES DAILY. 30 g 0  . S-Adenosylmethionine (SAM-E PO) Take 1,200 mg by mouth daily. 1600 mg daily    . Suvorexant (BELSOMRA) 10 MG TABS Take 10 mg by mouth Nightly. 30 tablet 2   No current facility-administered medications on file prior to visit.     Past Medical History:  Diagnosis Date  . Allergic rhinitis    dogs, dust, tree and ragweed  . Family history of breast cancer   . Family history of colon cancer   . Family  history of melanoma   . Family history of ovarian cancer   . Heart palpitations 10/13/2013  . History of chickenpox   . Osteopenia    dexa 11/09 -1.4 spine, -1.2 hip  . Pineal gland cyst    incidental finding  . Primiparous   . Single kidney    left removed cong UPJ obstruction    Past Surgical History:  Procedure Laterality Date  . ABDOMINAL HYSTERECTOMY  1996   age 45 total severe endometriosis  . APPENDECTOMY  2006  . CHOLECYSTECTOMY  2001  . left kidney  2002   removed congential UPJ obstruction   . TONSILLECTOMY  1965    Social History   Socioeconomic History  . Marital status: Married    Spouse name: Not on file  . Number of children: 2  . Years of education: Not on file  . Highest education level: Not on file  Social Needs  . Financial resource strain: Not on file  . Food insecurity - worry: Not on file  . Food insecurity - inability: Not on file  . Transportation needs - medical: Not on file  . Transportation needs - non-medical: Not on file  Occupational History  . Occupation: Chief Technology Officer  Tobacco Use  . Smoking status: Never Smoker  . Smokeless tobacco: Never Used  Substance and Sexual Activity  .  Alcohol use: Yes    Alcohol/week: 5.4 oz    Types: 9 Glasses of wine per week  . Drug use: No  . Sexual activity: Yes    Birth control/protection: Surgical  Other Topics Concern  . Not on file  Social History Narrative   1/2 brother- schizopherenia   Occupation: Ph.D- Publishing rights manager   Married   [place] of 4   Regular exercise- yes, power walking   2 adopted kids 2 dogs   Originally from [place] History  Problem Relation Age of Onset  . Breast cancer Mother   . Osteoarthritis Mother   . Multiple sclerosis Mother   . Hypertension Father   . Lung cancer Father   . Stroke Father   . Heart disease Father        pacemaker  . Diabetes Sister   . Hypertension Sister   . Polymyositis Sister   . Diabetes Brother   .  Hypertension Brother     Review of Systems     Objective:  There were no vitals filed for this visit. There were no vitals filed for this visit. There is no height or weight on file to calculate BMI.  Wt Readings from Last 3 Encounters:  11/28/16 147 lb (66.7 kg)  08/28/16 147 lb (66.7 kg)  09/28/15 147 lb (66.7 kg)     Physical Exam        Assessment & Plan:   See Problem List for Assessment and Plan of chronic medical problems.

## 2017-01-19 ENCOUNTER — Encounter: Payer: Self-pay | Admitting: Internal Medicine

## 2017-01-19 ENCOUNTER — Ambulatory Visit (INDEPENDENT_AMBULATORY_CARE_PROVIDER_SITE_OTHER): Payer: 59 | Admitting: Internal Medicine

## 2017-01-19 ENCOUNTER — Ambulatory Visit: Payer: 59 | Admitting: Internal Medicine

## 2017-01-19 VITALS — BP 162/94 | HR 79 | Temp 98.1°F | Resp 16 | Wt 148.0 lb

## 2017-01-19 DIAGNOSIS — R42 Dizziness and giddiness: Secondary | ICD-10-CM | POA: Diagnosis not present

## 2017-01-19 MED ORDER — MECLIZINE HCL 25 MG PO TABS: 12.5000 mg | ORAL_TABLET | Freq: Three times a day (TID) | ORAL | 2 refills | Status: DC | PRN

## 2017-01-19 MED FILL — MECLIZINE 25 MG TABLET: 25 | 10 days supply | Qty: 30 | Fill #0

## 2017-01-19 NOTE — Progress Notes (Signed)
Subjective:    Patient ID: Maria Kane, female    DOB: September 29, 1955, 61 y.o.   MRN: [id number]  HPI The patient is here for an acute visit.  Dizziness:  It started when she was in [place] - her sister died.   She thinks she was dehydrated.  She developed vertigo with true spinning sensation.  She was nauseous and her vision was off.  She tried to do head exercises last night from online and it caused her symptoms to be worse. .  It is worse with changes in head position.  She has had this in the past, but this is more severe.  She has not taken anything for it.  She denies numbness, tingling or weakness in her arms or legs.  She did have slight changes in her vision, but it was related to when she was dizzy.  She denies any cold symptoms including fever, nasal congestion, ear pain, sinus pressure and sore throat.   Medications and allergies reviewed with patient and updated if appropriate.  Patient Active Problem List   Diagnosis Date Noted  . Hair loss 08/28/2016  . Genetic testing 02/04/2016  . Family history of breast cancer   . Family history of ovarian cancer   . Family history of colon cancer   . Family history of melanoma   . Solitary pulmonary nodule 12/25/2013  . DOE (dyspnea on exertion) 11/27/2013  . Palpitation 10/16/2013  . Hyperlipidemia 10/16/2013  . Hemorrhoid 07/25/2012  . Insomnia 02/08/2012  . Breast lump on left side at 6 o'clock position 02/01/2011  . Single kidney   . Postmenopausal atrophic vaginitis 09/28/2010  . Allergic rhinitis   . NUMBNESS 01/05/2010  . Osteopenia 01/28/2008    Current Outpatient Medications on File Prior to Visit  Medication Sig Dispense Refill  . ANUSOL-HC 25 MG suppository UNWRAP AND INSERT 1 SUPPOSITORY RECTALLY 2 TIMES DAILY AS NEEDED FOR HEMORRHOIDS OR ITCHING. 60 suppository 0  . estradiol (VIVELLE-DOT) 0.025 MG/24HR PLACE 1 PATCH ONTO THE SKIN 2 TIMES A WEEK. 8 patch 7  . eszopiclone (LUNESTA) 2 MG TABS tablet Take 1 tablet  (2 mg total) by mouth at bedtime as needed for sleep. Take immediately before bedtime 30 tablet 0  . fexofenadine (ALLEGRA) 180 MG tablet Take 180 mg by mouth daily.    Marland Kitchen PROCTO-MED HC 2.5 % rectal cream APPLY 1 APPLICATION RECTALLY 2 TIMES DAILY. 30 g 0  . S-Adenosylmethionine (SAM-E PO) Take 1,200 mg by mouth daily. 1600 mg daily    . Suvorexant (BELSOMRA) 10 MG TABS Take 10 mg by mouth Nightly. 30 tablet 2   No current facility-administered medications on file prior to visit.     Past Medical History:  Diagnosis Date  . Allergic rhinitis    dogs, dust, tree and ragweed  . Family history of breast cancer   . Family history of colon cancer   . Family history of melanoma   . Family history of ovarian cancer   . Heart palpitations 10/13/2013  . History of chickenpox   . Osteopenia    dexa 11/09 -1.4 spine, -1.2 hip  . Pineal gland cyst    incidental finding  . Primiparous   . Single kidney    left removed cong UPJ obstruction    Past Surgical History:  Procedure Laterality Date  . ABDOMINAL HYSTERECTOMY  1996   age 35 total severe endometriosis  . APPENDECTOMY  2006  . CHOLECYSTECTOMY  2001  . left kidney  2002   removed congential UPJ obstruction   . TONSILLECTOMY  1965    Social History   Socioeconomic History  . Marital status: Married    Spouse name: None  . Number of children: 2  . Years of education: None  . Highest education level: None  Social Needs  . Financial resource strain: None  . Food insecurity - worry: None  . Food insecurity - inability: None  . Transportation needs - medical: None  . Transportation needs - non-medical: None  Occupational History  . Occupation: Chief Technology Officer  Tobacco Use  . Smoking status: Never Smoker  . Smokeless tobacco: Never Used  Substance and Sexual Activity  . Alcohol use: Yes    Alcohol/week: 5.4 oz    Types: 9 Glasses of wine per week  . Drug use: No  . Sexual activity: Yes    Birth control/protection:  Surgical  Other Topics Concern  . None  Social History Narrative   1/2 brother- schizopherenia   Occupation: Ph.D- Publishing rights manager   Married   [place] of 4   Regular exercise- yes, power walking   2 adopted kids 2 dogs   Originally from [place]    Family History  Problem Relation Age of Onset  . Breast cancer Mother   . Osteoarthritis Mother   . Multiple sclerosis Mother   . Hypertension Father   . Lung cancer Father   . Stroke Father   . Heart disease Father        pacemaker  . Diabetes Sister   . Hypertension Sister   . Polymyositis Sister   . Diabetes Brother   . Hypertension Brother     Review of Systems  Constitutional: Negative for fever.  HENT: Negative for congestion, ear pain, postnasal drip, sinus pressure and sore throat.   Eyes: Positive for visual disturbance.  Gastrointestinal: Positive for nausea (very mild).  Neurological: Positive for dizziness. Negative for weakness, numbness and headaches.       Objective:   Vitals:   01/19/17 1116  BP: (!) 162/94  Pulse: 79  Resp: 16  Temp: 98.1 F (36.7 C)  SpO2: 98%   Wt Readings from Last 3 Encounters:  01/19/17 148 lb (67.1 kg)  11/28/16 147 lb (66.7 kg)  08/28/16 147 lb (66.7 kg)   Body mass index is 24.63 kg/m.   Physical Exam  Constitutional: She is oriented to person, place, and time. She appears well-developed and well-nourished. No distress.  HENT:  Head: Normocephalic and atraumatic.  Right Ear: External ear normal.  Left Ear: External ear normal.  Mouth/Throat: Oropharynx is clear and moist.  Bilateral ear canals and tympanic membranes normal  Eyes: Conjunctivae and EOM are normal.  Neck: Neck supple. No tracheal deviation present. No thyromegaly present.  Cardiovascular: Normal rate, regular rhythm and normal heart sounds.  No murmur heard. Pulmonary/Chest: Effort normal. No respiratory distress. She has no wheezes. She has no rales.  Musculoskeletal: She exhibits no edema.    Normal strength  Lymphadenopathy:    She has no cervical adenopathy.  Neurological: She is alert and oriented to person, place, and time. No cranial nerve deficit.  Normal sensation all extremities  Skin: Skin is warm and dry. She is not diaphoretic.  Psychiatric: She has a normal mood and affect. Her behavior is normal.           Assessment & Plan:    See Problem List for Assessment and Plan of chronic medical problems.

## 2017-01-19 NOTE — Patient Instructions (Signed)
Take the meclizine three times a day as needed.  Try the head movements  How to Perform the Epley Maneuver The Epley maneuver is an exercise that relieves symptoms of vertigo. Vertigo is the feeling that you or your surroundings are moving when they are not. When you feel vertigo, you may feel like the room is spinning and have trouble walking. Dizziness is a little different than vertigo. When you are dizzy, you may feel unsteady or light-headed. You can do this maneuver at home whenever you have symptoms of vertigo. You can do it up to 3 times a day until your symptoms go away. Even though the Epley maneuver may relieve your vertigo for a few weeks, it is possible that your symptoms will return. This maneuver relieves vertigo, but it does not relieve dizziness. What are the risks? If it is done correctly, the Epley maneuver is considered safe. Sometimes it can lead to dizziness or nausea that goes away after a short time. If you develop other symptoms, such as changes in vision, weakness, or numbness, stop doing the maneuver and call your health care provider. How to perform the Epley maneuver 1. Sit on the edge of a bed or table with your back straight and your legs extended or hanging over the edge of the bed or table. 2. Turn your head halfway toward the affected ear or side. 3. Lie backward quickly with your head turned until you are lying flat on your back. You may want to position a pillow under your shoulders. 4. Hold this position for 30 seconds. You may experience an attack of vertigo. This is normal. 5. Turn your head to the opposite direction until your unaffected ear is facing the floor. 6. Hold this position for 30 seconds. You may experience an attack of vertigo. This is normal. Hold this position until the vertigo stops. 7. Turn your whole body to the same side as your head. Hold for another 30 seconds. 8. Sit back up. You can repeat this exercise up to 3 times a day. Follow  these instructions at home:  After doing the Epley maneuver, you can return to your normal activities.  Ask your health care provider if there is anything you should do at home to prevent vertigo. He or she may recommend that you: ? Keep your head raised (elevated) with two or more pillows while you sleep. ? Do not sleep on the side of your affected ear. ? Get up slowly from bed. ? Avoid sudden movements during the day. ? Avoid extreme head movement, like looking up or bending over. Contact a health care provider if:  Your vertigo gets worse.  You have other symptoms, including: ? Nausea. ? Vomiting. ? Headache. Get help right away if:  You have vision changes.  You have a severe or worsening headache or neck pain.  You cannot stop vomiting.  You have new numbness or weakness in any part of your body. Summary  Vertigo is the feeling that you or your surroundings are moving when they are not.  The Epley maneuver is an exercise that relieves symptoms of vertigo.  If the Epley maneuver is done correctly, it is considered safe. You can do it up to 3 times a day. This information is not intended to replace advice given to you by your health care provider. Make sure you discuss any questions you have with your health care provider. Document Released: 02/11/2013 Document Revised: 12/28/2015 Document Reviewed: 12/28/2015 Elsevier Interactive Patient Education  2017 [employer]    Vertigo Vertigo is the feeling that you or your surroundings are moving when they are not. Vertigo can be dangerous if it occurs while you are doing something that could endanger you or others, such as driving. What are the causes? This condition is caused by a disturbance in the signals that are sent by your body's sensory systems to your brain. Different causes of a disturbance can lead to vertigo, including:  Infections, especially in the inner ear.  A bad reaction to a drug, or misuse of alcohol  and medicines.  Withdrawal from drugs or alcohol.  Quickly changing positions, as when lying down or rolling over in bed.  Migraine headaches.  Decreased blood flow to the brain.  Decreased blood pressure.  Increased pressure in the brain from a head or neck injury, stroke, infection, tumor, or bleeding.  Central nervous system disorders.  What are the signs or symptoms? Symptoms of this condition usually occur when you move your head or your eyes in different directions. Symptoms may start suddenly, and they usually last for less than a minute. Symptoms may include:  Loss of balance and falling.  Feeling like you are spinning or moving.  Feeling like your surroundings are spinning or moving.  Nausea and vomiting.  Blurred vision or double vision.  Difficulty hearing.  Slurred speech.  Dizziness.  Involuntary eye movement (nystagmus).  Symptoms can be mild and cause only slight annoyance, or they can be severe and interfere with daily life. Episodes of vertigo may return (recur) over time, and they are often triggered by certain movements. Symptoms may improve over time. How is this diagnosed? This condition may be diagnosed based on medical history and the quality of your nystagmus. Your health care provider may test your eye movements by asking you to quickly change positions to trigger the nystagmus. This may be called the Dix-Hallpike test, head thrust test, or roll test. You may be referred to a health care provider who specializes in ear, nose, and throat (ENT) problems (otolaryngologist) or a provider who specializes in disorders of the central nervous system (neurologist). You may have additional testing, including:  A physical exam.  Blood tests.  MRI.  A CT scan.  An electrocardiogram (ECG). This records electrical activity in your heart.  An electroencephalogram (EEG). This records electrical activity in your brain.  Hearing tests.  How is this  treated? Treatment for this condition depends on the cause and the severity of the symptoms. Treatment options include:  Medicines to treat nausea or vertigo. These are usually used for severe cases. Some medicines that are used to treat other conditions may also reduce or eliminate vertigo symptoms. These include: ? Medicines that control allergies (antihistamines). ? Medicines that control seizures (anticonvulsants). ? Medicines that relieve depression (antidepressants). ? Medicines that relieve anxiety (sedatives).  Head movements to adjust your inner ear back to normal. If your vertigo is caused by an ear problem, your health care provider may recommend certain movements to correct the problem.  Surgery. This is rare.  Follow these instructions at home: Safety  Move slowly.Avoid sudden body or head movements.  Avoid driving.  Avoid operating heavy machinery.  Avoid doing any tasks that would cause danger to you or others if you would have a vertigo episode during the task.  If you have trouble walking or keeping your balance, try using a cane for stability. If you feel dizzy or unstable, sit down right away.  Return to  your normal activities as told by your health care provider. Ask your health care provider what activities are safe for you. General instructions  Take over-the-counter and prescription medicines only as told by your health care provider.  Avoid certain positions or movements as told by your health care provider.  Drink enough fluid to keep your urine clear or pale yellow.  Keep all follow-up visits as told by your health care provider. This is important. Contact a health care provider if:  Your medicines do not relieve your vertigo or they make it worse.  You have a fever.  Your condition gets worse or you develop new symptoms.  Your family or friends notice any behavioral changes.  Your nausea or vomiting gets worse.  You have numbness or a "pins  and needles" sensation in part of your body. Get help right away if:  You have difficulty moving or speaking.  You are always dizzy.  You faint.  You develop severe headaches.  You have weakness in your hands, arms, or legs.  You have changes in your hearing or vision.  You develop a stiff neck.  You develop sensitivity to light. This information is not intended to replace advice given to you by your health care provider. Make sure you discuss any questions you have with your health care provider. Document Released: 11/16/2004 Document Revised: 07/21/2015 Document Reviewed: 06/01/2014 Elsevier Interactive Patient Education  2017 [employer].

## 2017-01-20 NOTE — Assessment & Plan Note (Signed)
Likely BPPV She has had a prior episode Start meclizine 3 times daily as needed-discussed side effects No concerning neurological symptoms or deficits on exam Reassured her this is not from a stroke Will refer to PT Discussed that she can try head exercises at home Call if no improvement

## 2017-01-22 ENCOUNTER — Encounter: Payer: Self-pay | Admitting: Internal Medicine

## 2017-01-24 DIAGNOSIS — J3081 Allergic rhinitis due to animal (cat) (dog) hair and dander: Secondary | ICD-10-CM | POA: Diagnosis not present

## 2017-01-24 DIAGNOSIS — J3089 Other allergic rhinitis: Secondary | ICD-10-CM | POA: Diagnosis not present

## 2017-01-24 DIAGNOSIS — J301 Allergic rhinitis due to pollen: Secondary | ICD-10-CM | POA: Diagnosis not present

## 2017-01-25 DIAGNOSIS — R7989 Other specified abnormal findings of blood chemistry: Secondary | ICD-10-CM | POA: Diagnosis not present

## 2017-01-25 DIAGNOSIS — Q600 Renal agenesis, unilateral: Secondary | ICD-10-CM | POA: Diagnosis not present

## 2017-01-26 ENCOUNTER — Other Ambulatory Visit: Payer: Self-pay | Admitting: Internal Medicine

## 2017-01-26 MED FILL — HYDROCORTISONE AC 25 MG SUP: 25 | 30 days supply | Qty: 60 | Fill #0

## 2017-01-26 MED FILL — PROCTOZONE-HC 2.5 % CREA: 2.5 | 30 days supply | Qty: 30 | Fill #0

## 2017-02-02 ENCOUNTER — Ambulatory Visit: Payer: 59 | Admitting: Gastroenterology

## 2017-02-02 DIAGNOSIS — J301 Allergic rhinitis due to pollen: Secondary | ICD-10-CM | POA: Diagnosis not present

## 2017-02-02 DIAGNOSIS — J3081 Allergic rhinitis due to animal (cat) (dog) hair and dander: Secondary | ICD-10-CM | POA: Diagnosis not present

## 2017-02-02 DIAGNOSIS — J3089 Other allergic rhinitis: Secondary | ICD-10-CM | POA: Diagnosis not present

## 2017-02-19 DIAGNOSIS — J301 Allergic rhinitis due to pollen: Secondary | ICD-10-CM | POA: Diagnosis not present

## 2017-02-19 DIAGNOSIS — J3081 Allergic rhinitis due to animal (cat) (dog) hair and dander: Secondary | ICD-10-CM | POA: Diagnosis not present

## 2017-02-19 DIAGNOSIS — J3089 Other allergic rhinitis: Secondary | ICD-10-CM | POA: Diagnosis not present

## 2017-03-07 DIAGNOSIS — J3089 Other allergic rhinitis: Secondary | ICD-10-CM | POA: Diagnosis not present

## 2017-03-07 DIAGNOSIS — J3081 Allergic rhinitis due to animal (cat) (dog) hair and dander: Secondary | ICD-10-CM | POA: Diagnosis not present

## 2017-03-07 DIAGNOSIS — J301 Allergic rhinitis due to pollen: Secondary | ICD-10-CM | POA: Diagnosis not present

## 2017-03-12 ENCOUNTER — Encounter: Payer: Self-pay | Admitting: Gastroenterology

## 2017-03-12 ENCOUNTER — Ambulatory Visit: Payer: 59 | Admitting: Gastroenterology

## 2017-03-12 VITALS — BP 118/80 | HR 72 | Ht 64.5 in | Wt 151.0 lb

## 2017-03-12 DIAGNOSIS — K649 Unspecified hemorrhoids: Secondary | ICD-10-CM

## 2017-03-12 DIAGNOSIS — R198 Other specified symptoms and signs involving the digestive system and abdomen: Secondary | ICD-10-CM | POA: Diagnosis not present

## 2017-03-12 MED ORDER — PEG 3350-KCL-NA BICARB-NACL 420 G PO SOLR: 4000.0000 mL | Freq: Once | ORAL | 0 refills | Status: AC

## 2017-03-12 NOTE — Progress Notes (Signed)
Review of pertinent gastrointestinal problems: 1.  Routine risk for colon cancer.  Colonoscopy February 2010 for average risk screening found internal and external hemorrhoids.  No polyps.  She was recommended to have repeat colon cancer screening with colonoscopy at 10-year interval.   HPI: This is a  very pleasant 62 year old woman who was referred to me by Binnie Rail, MD  to evaluate change in bowels, hemorrhoids.    Chief complaint is change in bowels, hemorrhoids  For the past 6 months or so she has had relatively loose stools, sometimes explosive.  These happen once or twice a day.   she does not have blood in her stool.  She has no significant abdominal pains.  She is also bothered by some hemorrhoidal type bumps that her backside.  She has used creams and suppositories which help slightly but not completely.  No fevers or chills.  She does not have colon cancer in her family.  Old Data Reviewed:  Labs 11/2016: cbc, cmet were all normal.  Review of systems: Pertinent positive and negative review of systems were noted in the above HPI section. All other review negative.   Past Medical History:  Diagnosis Date  . Allergic rhinitis    dogs, dust, tree and ragweed  . Family history of breast cancer   . Family history of colon cancer   . Family history of melanoma   . Family history of ovarian cancer   . Heart palpitations 10/13/2013  . History of chickenpox   . History of gallstones   . Osteopenia    dexa 11/09 -1.4 spine, -1.2 hip  . Pineal gland cyst    incidental finding  . Primiparous   . Single kidney    left removed cong UPJ obstruction    Past Surgical History:  Procedure Laterality Date  . ABDOMINAL HYSTERECTOMY  1996   age 80 total severe endometriosis  . APPENDECTOMY  2006  . CHOLECYSTECTOMY  2001  . left kidney  2002   removed congential UPJ obstruction   . TONSILLECTOMY  1965    Current Outpatient Medications  Medication Sig Dispense Refill  .  ANUSOL-HC 25 MG suppository UNWRAP AND INSERT 1 SUPPOSITORY RECTALLY 2 TIMES DAILY AS NEEDED FOR HEMORRHOIDS OR ITCHING. 60 suppository 0  . ANUSOL-HC 25 MG suppository UNWRAP AND PLACE 1 SUPPOSITORY RECTALLY 2 TIMES DAILY AS NEEDED FOR HEMORRHOIDS OR ITCHING. 60 suppository 1  . estradiol (VIVELLE-DOT) 0.025 MG/24HR PLACE 1 PATCH ONTO THE SKIN 2 TIMES A WEEK. 8 patch 7  . fexofenadine (ALLEGRA) 180 MG tablet Take 180 mg by mouth daily.    Marland Kitchen PROCTO-MED HC 2.5 % rectal cream APPLY 1 APPLICATION RECTALLY 2 TIMES DAILY. 30 g 0  . PROCTO-MED HC 2.5 % rectal cream APPLY 1 APPLICATION RECTALLY 2 TIMES DAILY. 30 g 1  . S-Adenosylmethionine (SAM-E PO) Take 1,200 mg by mouth daily. 1600 mg daily     No current facility-administered medications for this visit.     Allergies as of 03/12/2017 - Review Complete 03/12/2017  Allergen Reaction Noted  . Anesthetics, amide  01/08/2014  . Clarithromycin Nausea And Vomiting 10/13/2013  . Trazodone and nefazodone Diarrhea 08/28/2016    Family History  Problem Relation Age of Onset  . Breast cancer Mother   . Osteoarthritis Mother   . Multiple sclerosis Mother   . Hypertension Father   . Lung cancer Father   . Stroke Father   . Heart disease Father  pacemaker  . Diabetes Sister   . Hypertension Sister   . Polymyositis Sister   . Diabetes Brother   . Hypertension Brother   . Colon cancer Maternal Aunt     Social History   Socioeconomic History  . Marital status: Married    Spouse name: Not on file  . Number of children: 2  . Years of education: Not on file  . Highest education level: Not on file  Social Needs  . Financial resource strain: Not on file  . Food insecurity - worry: Not on file  . Food insecurity - inability: Not on file  . Transportation needs - medical: Not on file  . Transportation needs - non-medical: Not on file  Occupational History  . Occupation: Chief Technology Officer  Tobacco Use  . Smoking status: Never Smoker   . Smokeless tobacco: Never Used  Substance and Sexual Activity  . Alcohol use: Yes    Alcohol/week: 5.4 oz    Types: 9 Glasses of wine per week  . Drug use: No  . Sexual activity: Yes    Birth control/protection: Surgical  Other Topics Concern  . Not on file  Social History Narrative   1/2 brother- schizopherenia   Occupation: Ph.D- Publishing rights manager   Married   [place] of 4   Regular exercise- yes, power walking   2 adopted kids 2 dogs   Originally from [place]     Physical Exam: BP 118/80   Pulse 72   Ht 5' 4.5" (1.638 m)   Wt 151 lb (68.5 kg)   BMI 25.52 kg/m  Constitutional: generally well-appearing Psychiatric: alert and oriented x3 Eyes: extraocular movements intact Mouth: oral pharynx moist, no lesions Neck: supple no lymphadenopathy Cardiovascular: heart regular rate and rhythm Lungs: clear to auscultation bilaterally Abdomen: soft, nontender, nondistended, no obvious ascites, no peritoneal signs, normal bowel sounds Extremities: no lower extremity edema bilaterally Skin: no lesions on visible extremities Rectal exam with female assistant in the room: Small to medium sized external anus skin tag that is a bit pedunculated, small deflated amount of external anal hemorrhoid tissue, no distal rectal masses, stool was brown and not checked for Hemoccult.  Assessment and plan: 62 y.o. female with change in bowels, gradual loosening of stools, hemorrhoids  First for her change in bowels, loosening of her stools I recommended colonoscopy at her soonest convenience.  She has already started from some fiber supplements and I recommended she continue that trial.  I think that the skin tag is causing most of her bothersome symptoms at her anus.  During the colonoscopy I will get a good evaluation of whether she has internal and also external hemorrhoids.  Following that she will need referral either to for general surgeon to consider skin tag resection or also possibly  referral to 1 of my partners for hemorrhoidal banding.    Please see the "Patient Instructions" section for addition details about the plan.   Owens Loffler, MD [facility] 03/12/2017, 2:38 PM  Cc: Binnie Rail, MD

## 2017-03-12 NOTE — Patient Instructions (Addendum)
You will be set up for a colonoscopy for change in bowels.   Please start taking citrucel (orange flavored) powder fiber supplement.  This may cause some bloating at first but that usually goes away. Begin with a small spoonful and work your way up to a large, heaping spoonful daily over a week.  Normal BMI (Body Mass Index- based on height and weight) is between 19 and 25. Your BMI today is Body mass index is 25.52 kg/m. Marland Kitchen Please consider follow up  regarding your BMI with your Primary Care Provider.

## 2017-03-21 DIAGNOSIS — J301 Allergic rhinitis due to pollen: Secondary | ICD-10-CM | POA: Diagnosis not present

## 2017-03-21 DIAGNOSIS — J3089 Other allergic rhinitis: Secondary | ICD-10-CM | POA: Diagnosis not present

## 2017-03-21 DIAGNOSIS — J3081 Allergic rhinitis due to animal (cat) (dog) hair and dander: Secondary | ICD-10-CM | POA: Diagnosis not present

## 2017-03-23 ENCOUNTER — Encounter: Payer: Self-pay | Admitting: Internal Medicine

## 2017-03-26 ENCOUNTER — Encounter: Payer: Self-pay | Admitting: Internal Medicine

## 2017-03-27 MED ORDER — LORAZEPAM 0.5 MG PO TABS: 0.5000 mg | ORAL_TABLET | Freq: Every evening | ORAL | 0 refills | Status: DC | PRN

## 2017-03-28 MED FILL — LORazepam 0.5 MG TABS: 0.5 | 30 days supply | Qty: 30 | Fill #0

## 2017-04-03 DIAGNOSIS — J301 Allergic rhinitis due to pollen: Secondary | ICD-10-CM | POA: Diagnosis not present

## 2017-04-03 DIAGNOSIS — J3081 Allergic rhinitis due to animal (cat) (dog) hair and dander: Secondary | ICD-10-CM | POA: Diagnosis not present

## 2017-04-03 DIAGNOSIS — J3089 Other allergic rhinitis: Secondary | ICD-10-CM | POA: Diagnosis not present

## 2017-04-03 MED FILL — PEG-3350 AND ELECTROLYTES S: 236 | 2 days supply | Qty: 4000 | Fill #0

## 2017-04-03 MED FILL — ESTRADIOL 0.025 MG PATCH: 0.025 | 56 days supply | Qty: 16 | Fill #2

## 2017-04-06 ENCOUNTER — Encounter: Payer: 59 | Admitting: Gastroenterology

## 2017-04-16 ENCOUNTER — Encounter: Payer: Self-pay | Admitting: Gastroenterology

## 2017-04-18 DIAGNOSIS — H1045 Other chronic allergic conjunctivitis: Secondary | ICD-10-CM | POA: Diagnosis not present

## 2017-04-18 DIAGNOSIS — J3089 Other allergic rhinitis: Secondary | ICD-10-CM | POA: Diagnosis not present

## 2017-04-18 DIAGNOSIS — J301 Allergic rhinitis due to pollen: Secondary | ICD-10-CM | POA: Diagnosis not present

## 2017-04-18 DIAGNOSIS — J3081 Allergic rhinitis due to animal (cat) (dog) hair and dander: Secondary | ICD-10-CM | POA: Diagnosis not present

## 2017-04-30 ENCOUNTER — Encounter: Payer: 59 | Admitting: Gastroenterology

## 2017-05-02 DIAGNOSIS — J301 Allergic rhinitis due to pollen: Secondary | ICD-10-CM | POA: Diagnosis not present

## 2017-05-02 DIAGNOSIS — J3089 Other allergic rhinitis: Secondary | ICD-10-CM | POA: Diagnosis not present

## 2017-05-02 DIAGNOSIS — J3081 Allergic rhinitis due to animal (cat) (dog) hair and dander: Secondary | ICD-10-CM | POA: Diagnosis not present

## 2017-05-21 ENCOUNTER — Encounter: Payer: Self-pay | Admitting: Internal Medicine

## 2017-05-21 DIAGNOSIS — J3081 Allergic rhinitis due to animal (cat) (dog) hair and dander: Secondary | ICD-10-CM | POA: Diagnosis not present

## 2017-05-21 DIAGNOSIS — J3089 Other allergic rhinitis: Secondary | ICD-10-CM | POA: Diagnosis not present

## 2017-05-21 DIAGNOSIS — Z803 Family history of malignant neoplasm of breast: Secondary | ICD-10-CM

## 2017-05-21 DIAGNOSIS — J301 Allergic rhinitis due to pollen: Secondary | ICD-10-CM | POA: Diagnosis not present

## 2017-05-21 DIAGNOSIS — Z1231 Encounter for screening mammogram for malignant neoplasm of breast: Secondary | ICD-10-CM

## 2017-05-21 DIAGNOSIS — R9230 Dense breasts, unspecified: Secondary | ICD-10-CM

## 2017-05-21 DIAGNOSIS — R922 Inconclusive mammogram: Secondary | ICD-10-CM

## 2017-05-22 ENCOUNTER — Other Ambulatory Visit: Payer: Self-pay | Admitting: Emergency Medicine

## 2017-05-22 DIAGNOSIS — R922 Inconclusive mammogram: Secondary | ICD-10-CM

## 2017-05-22 DIAGNOSIS — Z1231 Encounter for screening mammogram for malignant neoplasm of breast: Secondary | ICD-10-CM

## 2017-05-22 DIAGNOSIS — Z803 Family history of malignant neoplasm of breast: Secondary | ICD-10-CM

## 2017-05-23 DIAGNOSIS — Z1231 Encounter for screening mammogram for malignant neoplasm of breast: Secondary | ICD-10-CM | POA: Diagnosis not present

## 2017-05-23 DIAGNOSIS — Z803 Family history of malignant neoplasm of breast: Secondary | ICD-10-CM | POA: Diagnosis not present

## 2017-05-23 LAB — HM MAMMOGRAPHY

## 2017-05-29 ENCOUNTER — Encounter: Payer: Self-pay | Admitting: Internal Medicine

## 2017-05-29 DIAGNOSIS — J3081 Allergic rhinitis due to animal (cat) (dog) hair and dander: Secondary | ICD-10-CM | POA: Diagnosis not present

## 2017-05-29 DIAGNOSIS — J301 Allergic rhinitis due to pollen: Secondary | ICD-10-CM | POA: Diagnosis not present

## 2017-05-29 DIAGNOSIS — J3089 Other allergic rhinitis: Secondary | ICD-10-CM | POA: Diagnosis not present

## 2017-05-30 DIAGNOSIS — J301 Allergic rhinitis due to pollen: Secondary | ICD-10-CM | POA: Diagnosis not present

## 2017-05-31 DIAGNOSIS — J3081 Allergic rhinitis due to animal (cat) (dog) hair and dander: Secondary | ICD-10-CM | POA: Diagnosis not present

## 2017-05-31 DIAGNOSIS — J3089 Other allergic rhinitis: Secondary | ICD-10-CM | POA: Diagnosis not present

## 2017-06-01 ENCOUNTER — Telehealth: Payer: Self-pay | Admitting: Internal Medicine

## 2017-06-01 NOTE — Telephone Encounter (Signed)
Per [facility] note in pt's MRI referral:  "05/29/2017 per pt she is in stage 2 kidney failure and has one kidney will not have mri breast wwo/ fwp per dr Jimmye Norman not really a good study without contrast so we will notify the dr and let them know"  Please advise

## 2017-06-01 NOTE — Telephone Encounter (Signed)
Please call her.  Let her know the radiologist does not think she should have an MRI of the breast because it will require contrast and with her mild kidney disease and only having one kidney this is not a good idea.  The MRI without contrast is not a good study.  If she has an appointment with her gynecologist coming up she may want to discuss this further with them.

## 2017-06-01 NOTE — Telephone Encounter (Signed)
LVM for pt to call back in regards.  

## 2017-06-04 ENCOUNTER — Encounter: Payer: Self-pay | Admitting: Internal Medicine

## 2017-06-04 ENCOUNTER — Other Ambulatory Visit: Payer: Self-pay | Admitting: Internal Medicine

## 2017-06-04 MED FILL — ESTRADIOL 0.025 MG PATCH: 0.025 | 84 days supply | Qty: 24 | Fill #0

## 2017-06-04 MED FILL — PROCTOZONE-HC 2.5 % CREA: 2.5 | 30 days supply | Qty: 30 | Fill #1

## 2017-06-05 NOTE — Telephone Encounter (Signed)
t sent MyChart message regarding MRI. Responsed with Dr Quay Burow message.

## 2017-06-13 DIAGNOSIS — J3089 Other allergic rhinitis: Secondary | ICD-10-CM | POA: Diagnosis not present

## 2017-06-13 DIAGNOSIS — J3081 Allergic rhinitis due to animal (cat) (dog) hair and dander: Secondary | ICD-10-CM | POA: Diagnosis not present

## 2017-06-13 DIAGNOSIS — J301 Allergic rhinitis due to pollen: Secondary | ICD-10-CM | POA: Diagnosis not present

## 2017-06-15 DIAGNOSIS — J3089 Other allergic rhinitis: Secondary | ICD-10-CM | POA: Diagnosis not present

## 2017-06-15 DIAGNOSIS — J3081 Allergic rhinitis due to animal (cat) (dog) hair and dander: Secondary | ICD-10-CM | POA: Diagnosis not present

## 2017-06-15 DIAGNOSIS — J301 Allergic rhinitis due to pollen: Secondary | ICD-10-CM | POA: Diagnosis not present

## 2017-06-18 DIAGNOSIS — J301 Allergic rhinitis due to pollen: Secondary | ICD-10-CM | POA: Diagnosis not present

## 2017-06-18 DIAGNOSIS — J3089 Other allergic rhinitis: Secondary | ICD-10-CM | POA: Diagnosis not present

## 2017-06-18 DIAGNOSIS — J3081 Allergic rhinitis due to animal (cat) (dog) hair and dander: Secondary | ICD-10-CM | POA: Diagnosis not present

## 2017-06-22 DIAGNOSIS — J3081 Allergic rhinitis due to animal (cat) (dog) hair and dander: Secondary | ICD-10-CM | POA: Diagnosis not present

## 2017-06-22 DIAGNOSIS — J3089 Other allergic rhinitis: Secondary | ICD-10-CM | POA: Diagnosis not present

## 2017-06-22 DIAGNOSIS — J301 Allergic rhinitis due to pollen: Secondary | ICD-10-CM | POA: Diagnosis not present

## 2017-06-25 DIAGNOSIS — J3089 Other allergic rhinitis: Secondary | ICD-10-CM | POA: Diagnosis not present

## 2017-06-25 DIAGNOSIS — J3081 Allergic rhinitis due to animal (cat) (dog) hair and dander: Secondary | ICD-10-CM | POA: Diagnosis not present

## 2017-06-25 DIAGNOSIS — J301 Allergic rhinitis due to pollen: Secondary | ICD-10-CM | POA: Diagnosis not present

## 2017-06-29 DIAGNOSIS — J301 Allergic rhinitis due to pollen: Secondary | ICD-10-CM | POA: Diagnosis not present

## 2017-06-29 DIAGNOSIS — J3081 Allergic rhinitis due to animal (cat) (dog) hair and dander: Secondary | ICD-10-CM | POA: Diagnosis not present

## 2017-06-29 DIAGNOSIS — J3089 Other allergic rhinitis: Secondary | ICD-10-CM | POA: Diagnosis not present

## 2017-07-02 DIAGNOSIS — J301 Allergic rhinitis due to pollen: Secondary | ICD-10-CM | POA: Diagnosis not present

## 2017-07-02 DIAGNOSIS — J3081 Allergic rhinitis due to animal (cat) (dog) hair and dander: Secondary | ICD-10-CM | POA: Diagnosis not present

## 2017-07-02 DIAGNOSIS — J3089 Other allergic rhinitis: Secondary | ICD-10-CM | POA: Diagnosis not present

## 2017-07-03 DIAGNOSIS — M6740 Ganglion, unspecified site: Secondary | ICD-10-CM | POA: Diagnosis not present

## 2017-07-03 DIAGNOSIS — L821 Other seborrheic keratosis: Secondary | ICD-10-CM | POA: Diagnosis not present

## 2017-07-03 DIAGNOSIS — L218 Other seborrheic dermatitis: Secondary | ICD-10-CM | POA: Diagnosis not present

## 2017-07-03 DIAGNOSIS — L659 Nonscarring hair loss, unspecified: Secondary | ICD-10-CM | POA: Diagnosis not present

## 2017-07-06 DIAGNOSIS — J3081 Allergic rhinitis due to animal (cat) (dog) hair and dander: Secondary | ICD-10-CM | POA: Diagnosis not present

## 2017-07-06 DIAGNOSIS — J3089 Other allergic rhinitis: Secondary | ICD-10-CM | POA: Diagnosis not present

## 2017-07-06 DIAGNOSIS — J301 Allergic rhinitis due to pollen: Secondary | ICD-10-CM | POA: Diagnosis not present

## 2017-07-09 DIAGNOSIS — J3081 Allergic rhinitis due to animal (cat) (dog) hair and dander: Secondary | ICD-10-CM | POA: Diagnosis not present

## 2017-07-09 DIAGNOSIS — J301 Allergic rhinitis due to pollen: Secondary | ICD-10-CM | POA: Diagnosis not present

## 2017-07-09 DIAGNOSIS — J3089 Other allergic rhinitis: Secondary | ICD-10-CM | POA: Diagnosis not present

## 2017-07-09 MED FILL — HYDROCORTISONE 2.5% CREAM: 2.5 | 30 days supply | Qty: 30 | Fill #1

## 2017-07-13 DIAGNOSIS — J301 Allergic rhinitis due to pollen: Secondary | ICD-10-CM | POA: Diagnosis not present

## 2017-07-13 DIAGNOSIS — J3081 Allergic rhinitis due to animal (cat) (dog) hair and dander: Secondary | ICD-10-CM | POA: Diagnosis not present

## 2017-07-13 DIAGNOSIS — J3089 Other allergic rhinitis: Secondary | ICD-10-CM | POA: Diagnosis not present

## 2017-07-18 DIAGNOSIS — J301 Allergic rhinitis due to pollen: Secondary | ICD-10-CM | POA: Diagnosis not present

## 2017-07-18 DIAGNOSIS — J3081 Allergic rhinitis due to animal (cat) (dog) hair and dander: Secondary | ICD-10-CM | POA: Diagnosis not present

## 2017-07-18 DIAGNOSIS — J3089 Other allergic rhinitis: Secondary | ICD-10-CM | POA: Diagnosis not present

## 2017-07-31 DIAGNOSIS — J301 Allergic rhinitis due to pollen: Secondary | ICD-10-CM | POA: Diagnosis not present

## 2017-07-31 DIAGNOSIS — J3089 Other allergic rhinitis: Secondary | ICD-10-CM | POA: Diagnosis not present

## 2017-07-31 DIAGNOSIS — J3081 Allergic rhinitis due to animal (cat) (dog) hair and dander: Secondary | ICD-10-CM | POA: Diagnosis not present

## 2017-08-09 DIAGNOSIS — L668 Other cicatricial alopecia: Secondary | ICD-10-CM | POA: Diagnosis not present

## 2017-08-10 MED FILL — HYDROCORTISONE 2.5% CREAM: 2.5 | 30 days supply | Qty: 30 | Fill #2

## 2017-08-15 ENCOUNTER — Encounter: Payer: Self-pay | Admitting: Internal Medicine

## 2017-08-16 ENCOUNTER — Other Ambulatory Visit: Payer: Self-pay | Admitting: Internal Medicine

## 2017-08-16 MED ORDER — CIPROFLOXACIN HCL 500 MG PO TABS: 500.0000 mg | ORAL_TABLET | Freq: Two times a day (BID) | ORAL | 0 refills | Status: DC

## 2017-08-16 MED FILL — CIPROFLOXACIN HCL 500 MG TA: 500 | 3 days supply | Qty: 6 | Fill #0

## 2017-08-20 MED FILL — DOXYCYCLINE HYCLATE 100 MG: 100 | 30 days supply | Qty: 60 | Fill #0

## 2017-08-20 MED FILL — MOMETASONE FUROATE 0.1% CRM: 0.1 | 10 days supply | Qty: 30 | Fill #0

## 2017-08-21 DIAGNOSIS — J3089 Other allergic rhinitis: Secondary | ICD-10-CM | POA: Diagnosis not present

## 2017-08-21 DIAGNOSIS — J3081 Allergic rhinitis due to animal (cat) (dog) hair and dander: Secondary | ICD-10-CM | POA: Diagnosis not present

## 2017-08-21 DIAGNOSIS — J301 Allergic rhinitis due to pollen: Secondary | ICD-10-CM | POA: Diagnosis not present

## 2017-08-21 MED FILL — ESTRADIOL 0.025 MG PATCH: 0.025 | 84 days supply | Qty: 24 | Fill #1

## 2017-09-06 DIAGNOSIS — J3081 Allergic rhinitis due to animal (cat) (dog) hair and dander: Secondary | ICD-10-CM | POA: Diagnosis not present

## 2017-09-06 DIAGNOSIS — J3089 Other allergic rhinitis: Secondary | ICD-10-CM | POA: Diagnosis not present

## 2017-09-06 DIAGNOSIS — J301 Allergic rhinitis due to pollen: Secondary | ICD-10-CM | POA: Diagnosis not present

## 2017-09-25 DIAGNOSIS — J301 Allergic rhinitis due to pollen: Secondary | ICD-10-CM | POA: Diagnosis not present

## 2017-09-25 DIAGNOSIS — J3081 Allergic rhinitis due to animal (cat) (dog) hair and dander: Secondary | ICD-10-CM | POA: Diagnosis not present

## 2017-09-25 DIAGNOSIS — J3089 Other allergic rhinitis: Secondary | ICD-10-CM | POA: Diagnosis not present

## 2017-09-26 MED FILL — DOXYCYCLINE HYCLATE 100 MG: 100 | 30 days supply | Qty: 60 | Fill #1

## 2017-10-03 DIAGNOSIS — R944 Abnormal results of kidney function studies: Secondary | ICD-10-CM | POA: Diagnosis not present

## 2017-10-03 DIAGNOSIS — R7989 Other specified abnormal findings of blood chemistry: Secondary | ICD-10-CM | POA: Diagnosis not present

## 2017-10-03 DIAGNOSIS — Q600 Renal agenesis, unilateral: Secondary | ICD-10-CM | POA: Diagnosis not present

## 2017-10-09 DIAGNOSIS — L661 Lichen planopilaris: Secondary | ICD-10-CM | POA: Insufficient documentation

## 2017-10-09 DIAGNOSIS — L6612 Frontal fibrosing alopecia: Secondary | ICD-10-CM | POA: Insufficient documentation

## 2017-10-09 DIAGNOSIS — L659 Nonscarring hair loss, unspecified: Secondary | ICD-10-CM | POA: Diagnosis not present

## 2017-10-09 MED FILL — FINASTERIDE 5 MG TABLET: 5 | 90 days supply | Qty: 45 | Fill #0

## 2017-10-09 MED FILL — CLOBETASOL 0.05% SOLUTION: 0.05 | 30 days supply | Qty: 50 | Fill #0

## 2017-10-10 DIAGNOSIS — J301 Allergic rhinitis due to pollen: Secondary | ICD-10-CM | POA: Diagnosis not present

## 2017-10-10 DIAGNOSIS — J3081 Allergic rhinitis due to animal (cat) (dog) hair and dander: Secondary | ICD-10-CM | POA: Diagnosis not present

## 2017-10-10 DIAGNOSIS — J3089 Other allergic rhinitis: Secondary | ICD-10-CM | POA: Diagnosis not present

## 2017-10-25 DIAGNOSIS — J3089 Other allergic rhinitis: Secondary | ICD-10-CM | POA: Diagnosis not present

## 2017-10-25 DIAGNOSIS — J301 Allergic rhinitis due to pollen: Secondary | ICD-10-CM | POA: Diagnosis not present

## 2017-10-25 DIAGNOSIS — J3081 Allergic rhinitis due to animal (cat) (dog) hair and dander: Secondary | ICD-10-CM | POA: Diagnosis not present

## 2017-11-05 DIAGNOSIS — L218 Other seborrheic dermatitis: Secondary | ICD-10-CM | POA: Diagnosis not present

## 2017-11-05 DIAGNOSIS — L661 Lichen planopilaris: Secondary | ICD-10-CM | POA: Diagnosis not present

## 2017-11-06 ENCOUNTER — Telehealth: Payer: Self-pay

## 2017-11-06 DIAGNOSIS — Z0000 Encounter for general adult medical examination without abnormal findings: Secondary | ICD-10-CM

## 2017-11-06 DIAGNOSIS — R739 Hyperglycemia, unspecified: Secondary | ICD-10-CM | POA: Insufficient documentation

## 2017-11-06 DIAGNOSIS — E7849 Other hyperlipidemia: Secondary | ICD-10-CM

## 2017-11-06 DIAGNOSIS — R7303 Prediabetes: Secondary | ICD-10-CM | POA: Insufficient documentation

## 2017-11-06 NOTE — Telephone Encounter (Signed)
ordered

## 2017-11-06 NOTE — Telephone Encounter (Signed)
LVM letting pt know.  

## 2017-11-06 NOTE — Telephone Encounter (Signed)
Copied from [facility] [id number]. Topic: Appointment Scheduling - Scheduling Inquiry for Clinic >> Nov 06, 2017  9:55 AM Ahmed Prima L wrote: Reason for CRM: Patient is requesting to have her labs done before her physical on 10/22. Please advise.

## 2017-11-07 MED FILL — PROCTOZONE-HC 2.5 % CREA: 2.5 | 30 days supply | Qty: 30 | Fill #0

## 2017-11-07 MED FILL — CLOBETASOL 0.05% SOLUTION: 0.05 | 30 days supply | Qty: 50 | Fill #1

## 2017-11-07 MED FILL — ESTRADIOL 0.025 MG PATCH: 0.025 | 56 days supply | Qty: 16 | Fill #2

## 2017-11-08 MED FILL — HYDROCORTISONE 2.5% CREAM: 2.5 | 30 days supply | Qty: 30 | Fill #0

## 2017-11-09 ENCOUNTER — Encounter: Payer: Self-pay | Admitting: Internal Medicine

## 2017-11-09 DIAGNOSIS — J3089 Other allergic rhinitis: Secondary | ICD-10-CM | POA: Diagnosis not present

## 2017-11-09 DIAGNOSIS — J301 Allergic rhinitis due to pollen: Secondary | ICD-10-CM | POA: Diagnosis not present

## 2017-11-09 DIAGNOSIS — J3081 Allergic rhinitis due to animal (cat) (dog) hair and dander: Secondary | ICD-10-CM | POA: Diagnosis not present

## 2017-11-28 DIAGNOSIS — J3089 Other allergic rhinitis: Secondary | ICD-10-CM | POA: Diagnosis not present

## 2017-11-28 DIAGNOSIS — J301 Allergic rhinitis due to pollen: Secondary | ICD-10-CM | POA: Diagnosis not present

## 2017-11-28 DIAGNOSIS — J3081 Allergic rhinitis due to animal (cat) (dog) hair and dander: Secondary | ICD-10-CM | POA: Diagnosis not present

## 2017-12-11 ENCOUNTER — Encounter: Payer: 59 | Admitting: Internal Medicine

## 2017-12-18 DIAGNOSIS — J301 Allergic rhinitis due to pollen: Secondary | ICD-10-CM | POA: Diagnosis not present

## 2017-12-18 DIAGNOSIS — J3081 Allergic rhinitis due to animal (cat) (dog) hair and dander: Secondary | ICD-10-CM | POA: Diagnosis not present

## 2017-12-18 DIAGNOSIS — J3089 Other allergic rhinitis: Secondary | ICD-10-CM | POA: Diagnosis not present

## 2017-12-31 ENCOUNTER — Other Ambulatory Visit: Payer: Self-pay | Admitting: Internal Medicine

## 2017-12-31 MED FILL — FINASTERIDE 5 MG TABLET: 5 | 90 days supply | Qty: 45 | Fill #1

## 2018-01-07 DIAGNOSIS — J3081 Allergic rhinitis due to animal (cat) (dog) hair and dander: Secondary | ICD-10-CM | POA: Diagnosis not present

## 2018-01-07 DIAGNOSIS — J301 Allergic rhinitis due to pollen: Secondary | ICD-10-CM | POA: Diagnosis not present

## 2018-01-07 DIAGNOSIS — J3089 Other allergic rhinitis: Secondary | ICD-10-CM | POA: Diagnosis not present

## 2018-01-10 DIAGNOSIS — H524 Presbyopia: Secondary | ICD-10-CM | POA: Diagnosis not present

## 2018-01-11 ENCOUNTER — Telehealth: Payer: Self-pay | Admitting: Internal Medicine

## 2018-01-11 MED ORDER — ESTRADIOL 0.025 MG/24HR TD PTTW: MEDICATED_PATCH | TRANSDERMAL | 0 refills | Status: DC

## 2018-01-11 MED FILL — DOTTI 0.025 MG/24HR PTTW: 0.025 | 28 days supply | Qty: 8 | Fill #0

## 2018-01-11 NOTE — Telephone Encounter (Signed)
Copied from [place] [id number]. Topic: Quick Communication - Rx Refill/Question >> Jan 11, 2018 12:50 PM Blase Mess A wrote: Medication: estradiol (VIVELLE-DOT) 0.025 MG/24HR [[id number]] -Patient was told that she needed to come in for a medication refill However, also wanted a CPE. CPE schedule 03/05/18. Patient declined coming in sooner for medication refill Has the patient contacted their pharmacy? Yes (Agent: If no, request that the patient contact the pharmacy for the refill.) (Agent: If yes, when and what did the pharmacy advise?)  Preferred Pharmacy (with phone number or street name):   Agent: Please be advised that RX refills may take up to 3 business days. We ask that you follow-up with your pharmacy.

## 2018-01-11 NOTE — Telephone Encounter (Signed)
Rx sent. Pt aware.  

## 2018-01-25 DIAGNOSIS — J3089 Other allergic rhinitis: Secondary | ICD-10-CM | POA: Diagnosis not present

## 2018-01-25 DIAGNOSIS — J301 Allergic rhinitis due to pollen: Secondary | ICD-10-CM | POA: Diagnosis not present

## 2018-01-25 DIAGNOSIS — J3081 Allergic rhinitis due to animal (cat) (dog) hair and dander: Secondary | ICD-10-CM | POA: Diagnosis not present

## 2018-02-11 DIAGNOSIS — J3081 Allergic rhinitis due to animal (cat) (dog) hair and dander: Secondary | ICD-10-CM | POA: Diagnosis not present

## 2018-02-11 DIAGNOSIS — J301 Allergic rhinitis due to pollen: Secondary | ICD-10-CM | POA: Diagnosis not present

## 2018-02-11 DIAGNOSIS — J3089 Other allergic rhinitis: Secondary | ICD-10-CM | POA: Diagnosis not present

## 2018-02-21 ENCOUNTER — Other Ambulatory Visit: Payer: Self-pay | Admitting: Internal Medicine

## 2018-02-21 MED FILL — DOTTI 0.025 MG/24HR PTTW: 0.025 | 28 days supply | Qty: 8 | Fill #0

## 2018-02-26 DIAGNOSIS — J301 Allergic rhinitis due to pollen: Secondary | ICD-10-CM | POA: Diagnosis not present

## 2018-02-26 DIAGNOSIS — J3081 Allergic rhinitis due to animal (cat) (dog) hair and dander: Secondary | ICD-10-CM | POA: Diagnosis not present

## 2018-02-26 DIAGNOSIS — J3089 Other allergic rhinitis: Secondary | ICD-10-CM | POA: Diagnosis not present

## 2018-03-05 ENCOUNTER — Encounter: Payer: 59 | Admitting: Internal Medicine

## 2018-03-18 DIAGNOSIS — J3081 Allergic rhinitis due to animal (cat) (dog) hair and dander: Secondary | ICD-10-CM | POA: Diagnosis not present

## 2018-03-18 DIAGNOSIS — J3089 Other allergic rhinitis: Secondary | ICD-10-CM | POA: Diagnosis not present

## 2018-03-18 DIAGNOSIS — J301 Allergic rhinitis due to pollen: Secondary | ICD-10-CM | POA: Diagnosis not present

## 2018-03-20 ENCOUNTER — Encounter: Payer: 59 | Admitting: Internal Medicine

## 2018-03-27 ENCOUNTER — Ambulatory Visit (INDEPENDENT_AMBULATORY_CARE_PROVIDER_SITE_OTHER): Payer: 59 | Admitting: Internal Medicine

## 2018-03-27 ENCOUNTER — Other Ambulatory Visit (INDEPENDENT_AMBULATORY_CARE_PROVIDER_SITE_OTHER): Payer: 59

## 2018-03-27 ENCOUNTER — Encounter: Payer: Self-pay | Admitting: Internal Medicine

## 2018-03-27 VITALS — BP 120/84 | HR 69 | Temp 98.5°F | Resp 16 | Ht 64.5 in | Wt 150.8 lb

## 2018-03-27 DIAGNOSIS — E7849 Other hyperlipidemia: Secondary | ICD-10-CM

## 2018-03-27 DIAGNOSIS — K649 Unspecified hemorrhoids: Secondary | ICD-10-CM | POA: Diagnosis not present

## 2018-03-27 DIAGNOSIS — R739 Hyperglycemia, unspecified: Secondary | ICD-10-CM | POA: Diagnosis not present

## 2018-03-27 DIAGNOSIS — L661 Lichen planopilaris: Secondary | ICD-10-CM | POA: Diagnosis not present

## 2018-03-27 DIAGNOSIS — M85861 Other specified disorders of bone density and structure, right lower leg: Secondary | ICD-10-CM

## 2018-03-27 DIAGNOSIS — M85862 Other specified disorders of bone density and structure, left lower leg: Secondary | ICD-10-CM | POA: Diagnosis not present

## 2018-03-27 DIAGNOSIS — Z1231 Encounter for screening mammogram for malignant neoplasm of breast: Secondary | ICD-10-CM | POA: Diagnosis not present

## 2018-03-27 DIAGNOSIS — R350 Frequency of micturition: Secondary | ICD-10-CM | POA: Diagnosis not present

## 2018-03-27 DIAGNOSIS — Z0000 Encounter for general adult medical examination without abnormal findings: Secondary | ICD-10-CM

## 2018-03-27 LAB — CBC WITH DIFFERENTIAL/PLATELET
Basophils Absolute: 0.1 10*3/uL (ref 0.0–0.1)
Basophils Relative: 1 % (ref 0.0–3.0)
EOS ABS: 0.2 10*3/uL (ref 0.0–0.7)
Eosinophils Relative: 2.7 % (ref 0.0–5.0)
HCT: 41.5 % (ref 36.0–46.0)
Hemoglobin: 14.1 g/dL (ref 12.0–15.0)
LYMPHS PCT: 37.4 % (ref 12.0–46.0)
Lymphs Abs: 2.5 10*3/uL (ref 0.7–4.0)
MCHC: 34 g/dL (ref 30.0–36.0)
MCV: 97.9 fl (ref 78.0–100.0)
Monocytes Absolute: 0.5 10*3/uL (ref 0.1–1.0)
Monocytes Relative: 7.3 % (ref 3.0–12.0)
Neutro Abs: 3.5 10*3/uL (ref 1.4–7.7)
Neutrophils Relative %: 51.6 % (ref 43.0–77.0)
Platelets: 249 10*3/uL (ref 150.0–400.0)
RBC: 4.24 Mil/uL (ref 3.87–5.11)
RDW: 13.2 % (ref 11.5–15.5)
WBC: 6.7 10*3/uL (ref 4.0–10.5)

## 2018-03-27 LAB — COMPREHENSIVE METABOLIC PANEL
ALT: 18 U/L (ref 0–35)
AST: 17 U/L (ref 0–37)
Albumin: 4.8 g/dL (ref 3.5–5.2)
Alkaline Phosphatase: 66 U/L (ref 39–117)
BILIRUBIN TOTAL: 0.5 mg/dL (ref 0.2–1.2)
BUN: 15 mg/dL (ref 6–23)
CO2: 25 meq/L (ref 19–32)
Calcium: 9.9 mg/dL (ref 8.4–10.5)
Chloride: 106 mEq/L (ref 96–112)
Creatinine, Ser: 0.9 mg/dL (ref 0.40–1.20)
GFR: 63.64 mL/min (ref >60.00)
GLUCOSE: 84 mg/dL (ref 70–99)
Potassium: 5.2 mEq/L — ABNORMAL HIGH (ref 3.5–5.1)
Sodium: 142 mEq/L (ref 135–145)
Total Protein: 6.9 g/dL (ref 6.0–8.3)

## 2018-03-27 LAB — URINALYSIS, ROUTINE W REFLEX MICROSCOPIC
Bilirubin Urine: NEGATIVE
Hgb urine dipstick: NEGATIVE
Ketones, ur: NEGATIVE
Leukocytes, UA: NEGATIVE
Nitrite: NEGATIVE
RBC / HPF: NONE SEEN (ref 0–?)
Total Protein, Urine: NEGATIVE
Urine Glucose: NEGATIVE
Urobilinogen, UA: 0.2 (ref 0.0–1.0)
WBC UA: NONE SEEN (ref 0–?)
pH: 6 (ref 5.0–8.0)

## 2018-03-27 LAB — TSH: TSH: 2.51 u[IU]/mL (ref 0.35–4.50)

## 2018-03-27 LAB — LIPID PANEL
Cholesterol: 198 mg/dL (ref 0–200)
HDL: 63.5 mg/dL (ref >39.00)
LDL Cholesterol: 110 mg/dL — ABNORMAL HIGH (ref 0–99)
NonHDL: 134.32
Total CHOL/HDL Ratio: 3
Triglycerides: 124 mg/dL (ref 0.0–149.0)
VLDL: 24.8 mg/dL (ref 0.0–40.0)

## 2018-03-27 LAB — HEMOGLOBIN A1C: HEMOGLOBIN A1C: 5.5 % (ref 4.6–6.5)

## 2018-03-27 MED ORDER — ESTRADIOL 0.025 MG/24HR TD PTTW: MEDICATED_PATCH | TRANSDERMAL | 3 refills | Status: DC

## 2018-03-27 MED FILL — ESTRADIOL 0.025 MG/24HR PTT: 0.025 | 84 days supply | Qty: 24 | Fill #0

## 2018-03-27 MED FILL — FINASTERIDE 5 MG TABLET: 5 | 90 days supply | Qty: 45 | Fill #2

## 2018-03-27 NOTE — Assessment & Plan Note (Signed)
Has some increase in urinary frequency-likely related to drinking a lot of fluids, but will check a urinalysis

## 2018-03-27 NOTE — Assessment & Plan Note (Addendum)
Not taking any calcium Taking vitamin d via a renal vitamin Eats some dairy  Exercising regularly She will schedule a DEXA scan at [facility] when she has her mammogram

## 2018-03-27 NOTE — Progress Notes (Signed)
Subjective:    Patient ID: Maria Kane, female    DOB: [birth year], 63 y.o.   MRN: [id number]  HPI She is here for a physical exam.   Her sister has has breast, mother had breast cancer, three first cousins.  Would like to have breast MRI.  She has had genetic testing, which was negative.  Her last mammogram was normal and her next mammogram is due in April.    Medications and allergies reviewed with patient and updated if appropriate.  Patient Active Problem List   Diagnosis Date Noted  . Hyperglycemia 11/06/2017  . Frontal fibrosing alopecia 10/09/2017  . Vertigo 01/19/2017  . Hair loss 08/28/2016  . Genetic testing 02/04/2016  . Family history of breast cancer   . Family history of ovarian cancer   . Family history of colon cancer   . Family history of melanoma   . Solitary pulmonary nodule 12/25/2013  . Palpitation 10/16/2013  . Hyperlipidemia 10/16/2013  . Hemorrhoid 07/25/2012  . Insomnia 02/08/2012  . Single kidney   . Postmenopausal atrophic vaginitis 09/28/2010  . Allergic rhinitis   . NUMBNESS 01/05/2010  . Osteopenia 01/28/2008    Current Outpatient Medications on File Prior to Visit  Medication Sig Dispense Refill  . ANUSOL-HC 25 MG suppository UNWRAP AND PLACE 1 SUPPOSITORY RECTALLY 2 TIMES DAILY AS NEEDED FOR HEMORRHOIDS OR ITCHING. 60 suppository 1  . clobetasol (TEMOVATE) 0.05 % external solution     . finasteride (PROSCAR) 5 MG tablet Take 5 mg by mouth daily. Take 1/2 tablet daily.    Marland Kitchen LORazepam (ATIVAN) 0.5 MG tablet Take 1 tablet (0.5 mg total) by mouth at bedtime as needed for anxiety or sleep. 30 tablet 0  . PROCTO-MED HC 2.5 % rectal cream APPLY 1 APPLICATION RECTALLY 2 TIMES DAILY. 30 g 0  . S-Adenosylmethionine (SAM-E PO) Take 800 mg by mouth daily. 1600 mg daily      No current facility-administered medications on file prior to visit.     Past Medical History:  Diagnosis Date  . Allergic rhinitis    dogs, dust, tree and ragweed  .  Family history of breast cancer   . Family history of colon cancer   . Family history of melanoma   . Family history of ovarian cancer   . Heart palpitations 10/13/2013  . History of chickenpox   . History of gallstones   . Osteopenia    dexa 11/09 -1.4 spine, -1.2 hip  . Pineal gland cyst    incidental finding  . Primiparous   . Single kidney    left removed cong UPJ obstruction    Past Surgical History:  Procedure Laterality Date  . ABDOMINAL HYSTERECTOMY  1996   age 59 total severe endometriosis  . APPENDECTOMY  2006  . CHOLECYSTECTOMY  2001  . left kidney  2002   removed congential UPJ obstruction   . TONSILLECTOMY  1965    Social History   Socioeconomic History  . Marital status: Married    Spouse name: Not on file  . Number of children: 2  . Years of education: Not on file  . Highest education level: Not on file  Occupational History  . Occupation: Chief Technology Officer  Social Needs  . Financial resource strain: Not on file  . Food insecurity:    Worry: Not on file    Inability: Not on file  . Transportation needs:    Medical: Not on file    Non-medical: Not on  file  Tobacco Use  . Smoking status: Never Smoker  . Smokeless tobacco: Never Used  Substance and Sexual Activity  . Alcohol use: Yes    Alcohol/week: 9.0 standard drinks    Types: 9 Glasses of wine per week  . Drug use: No  . Sexual activity: Yes    Birth control/protection: Surgical  Lifestyle  . Physical activity:    Days per week: Not on file    Minutes per session: Not on file  . Stress: Not on file  Relationships  . Social connections:    Talks on phone: Not on file    Gets together: Not on file    Attends religious service: Not on file    Active member of club or organization: Not on file    Attends meetings of clubs or organizations: Not on file    Relationship status: Not on file  Other Topics Concern  . Not on file  Social History Narrative   1/2 brother- schizopherenia    Occupation: Ph.D- Publishing rights manager   Married   [place] of 4   Regular exercise- yes, power walking   2 adopted kids 2 dogs   Originally from [place] History  Problem Relation Age of Onset  . Breast cancer Mother   . Osteoarthritis Mother   . Multiple sclerosis Mother   . Hypertension Father   . Lung cancer Father   . Stroke Father   . Heart disease Father        pacemaker  . Diabetes Sister   . Hypertension Sister   . Polymyositis Sister   . Diabetes Brother   . Hypertension Brother   . Colon cancer Maternal Aunt     Review of Systems  Constitutional: Negative for chills and fever.  Eyes: Negative for visual disturbance.  Respiratory: Negative for cough, shortness of breath and wheezing.   Cardiovascular: Negative for chest pain, palpitations and leg swelling.  Gastrointestinal: Positive for diarrhea (occ). Negative for abdominal pain, anal bleeding, blood in stool and nausea.       No gerd  Genitourinary: Positive for frequency. Negative for dysuria and hematuria.  Musculoskeletal: Negative for arthralgias and back pain.  Skin: Negative for color change and rash.  Neurological: Positive for light-headedness. Negative for headaches.  Psychiatric/Behavioral: Negative for dysphoric mood. The patient is nervous/anxious.        Objective:   Vitals:   03/27/18 0911  BP: 120/84  Pulse: 69  Resp: 16  Temp: 98.5 F (36.9 C)  SpO2: 96%   Filed Weights   03/27/18 0911  Weight: 150 lb 12.8 oz (68.4 kg)   Body mass index is 25.49 kg/m.  BP Readings from Last 3 Encounters:  03/27/18 120/84  03/12/17 118/80  01/19/17 (!) 162/94    Wt Readings from Last 3 Encounters:  03/27/18 150 lb 12.8 oz (68.4 kg)  03/12/17 151 lb (68.5 kg)  01/19/17 148 lb (67.1 kg)     Physical Exam Constitutional: She appears well-developed and well-nourished. No distress.  HENT:  Head: Normocephalic and atraumatic.  Right Ear: External ear normal. Normal ear canal and  TM Left Ear: External ear normal.  Normal ear canal and TM Mouth/Throat: Oropharynx is clear and moist.  Eyes: Conjunctivae and EOM are normal.  Neck: Neck supple. No tracheal deviation present. No thyromegaly present.  No carotid bruit  Cardiovascular: Normal rate, regular rhythm and normal heart sounds.   No murmur heard.  No edema. Pulmonary/Chest: Effort normal and  breath sounds normal. No respiratory distress. She has no wheezes. She has no rales.  Breast: deferred Abdominal: Soft. She exhibits no distension. There is no tenderness.  Lymphadenopathy: She has no cervical adenopathy.  Skin: Skin is warm and dry. She is not diaphoretic.  Psychiatric: She has a normal mood and affect. Her behavior is normal.        Assessment & Plan:   Physical exam: Screening blood work  ordered Immunizations  Discussed new shingles, others up to date Colonoscopy   Due this month Mammogram   Up to date  Gyn  No longer seeing - I will prescribe her hormone patch Dexa   Due 04/2018  Eye exams   Up to date  EKG   2015 Exercise  regular Weight  BMI good Skin  No concerns Substance abuse   none  See Problem List for Assessment and Plan of chronic medical problems.   Follow-up annually

## 2018-03-27 NOTE — Assessment & Plan Note (Signed)
Sister, mother and 3 first cousins have her have had breast cancer She has had genetic testing, which was negative, but given this many relatives with breast cancer she is at a high risk Would like to have breast MRI in addition to mammography-we will order

## 2018-03-27 NOTE — Assessment & Plan Note (Signed)
Check A1c. 

## 2018-03-27 NOTE — Assessment & Plan Note (Signed)
Diet controlled Check lipid panel, CMP Continue regular exercise and healthy diet

## 2018-03-27 NOTE — Patient Instructions (Addendum)
Schedule dexa at [facility]. Take calcium citrate 600 mg - 1200 mg / day   Tests ordered today. Your results will be released to [place] (or called to you) after review, usually within 72hours after test completion. If any changes need to be made, you will be notified at that same time.  All other Health Maintenance issues reviewed.   All recommended immunizations and age-appropriate screenings are up-to-date or discussed.  No immunizations administered today.   Medications reviewed and updated.  Changes include :   none  Your prescription(s) have been submitted to your pharmacy. Please take as directed and contact our office if you believe you are having problem(s) with the medication(s).  An MRI of her breasts were ordered.   A referral was ordered for surgery.    Please followup in one year   Health Maintenance, Female Adopting a healthy lifestyle and getting preventive care can go a long way to promote health and wellness. Talk with your health care provider about what schedule of regular examinations is right for you. This is a good chance for you to check in with your provider about disease prevention and staying healthy. In between checkups, there are plenty of things you can do on your own. Experts have done a lot of research about which lifestyle changes and preventive measures are most likely to keep you healthy. Ask your health care provider for more information. Weight and diet Eat a healthy diet  Be sure to include plenty of vegetables, fruits, low-fat dairy products, and lean protein.  Do not eat a lot of foods high in solid fats, added sugars, or salt.  Get regular exercise. This is one of the most important things you can do for your health. ? Most adults should exercise for at least 150 minutes each week. The exercise should increase your heart rate and make you sweat (moderate-intensity exercise). ? Most adults should also do strengthening exercises at least twice a  week. This is in addition to the moderate-intensity exercise. Maintain a healthy weight  Body mass index (BMI) is a measurement that can be used to identify possible weight problems. It estimates body fat based on height and weight. Your health care provider can help determine your BMI and help you achieve or maintain a healthy weight.  For females 60 years of age and older: ? A BMI below 18.5 is considered underweight. ? A BMI of 18.5 to 24.9 is normal. ? A BMI of 25 to 29.9 is considered overweight. ? A BMI of 30 and above is considered obese. Watch levels of cholesterol and blood lipids  You should start having your blood tested for lipids and cholesterol at 61 years of age, then have this test every 5 years.  You may need to have your cholesterol levels checked more often if: ? Your lipid or cholesterol levels are high. ? You are older than 61 years of age. ? You are at high risk for heart disease. Cancer screening Lung Cancer  Lung cancer screening is recommended for adults 94-52 years old who are at high risk for lung cancer because of a history of smoking.  A yearly low-dose CT scan of the lungs is recommended for people who: ? Currently smoke. ? Have quit within the past 15 years. ? Have at least a 30-pack-year history of smoking. A pack year is smoking an average of one pack of cigarettes a day for 1 year.  Yearly screening should continue until it has been 15 years  since you quit.  Yearly screening should stop if you develop a health problem that would prevent you from having lung cancer treatment. Breast Cancer  Practice breast self-awareness. This means understanding how your breasts normally appear and feel.  It also means doing regular breast self-exams. Let your health care provider know about any changes, no matter how small.  If you are in your 20s or 30s, you should have a clinical breast exam (CBE) by a health care provider every 1-3 years as part of a regular  health exam.  If you are 14 or older, have a CBE every year. Also consider having a breast X-ray (mammogram) every year.  If you have a family history of breast cancer, talk to your health care provider about genetic screening.  If you are at high risk for breast cancer, talk to your health care provider about having an MRI and a mammogram every year.  Breast cancer gene (BRCA) assessment is recommended for women who have family members with BRCA-related cancers. BRCA-related cancers include: ? Breast. ? Ovarian. ? Tubal. ? Peritoneal cancers.  Results of the assessment will determine the need for genetic counseling and BRCA1 and BRCA2 testing. Cervical Cancer Your health care provider may recommend that you be screened regularly for cancer of the pelvic organs (ovaries, uterus, and vagina). This screening involves a pelvic examination, including checking for microscopic changes to the surface of your cervix (Pap test). You may be encouraged to have this screening done every 3 years, beginning at age 47.  For women ages 59-65, health care providers may recommend pelvic exams and Pap testing every 3 years, or they may recommend the Pap and pelvic exam, combined with testing for human papilloma virus (HPV), every 5 years. Some types of HPV increase your risk of cervical cancer. Testing for HPV may also be done on women of any age with unclear Pap test results.  Other health care providers may not recommend any screening for nonpregnant women who are considered low risk for pelvic cancer and who do not have symptoms. Ask your health care provider if a screening pelvic exam is right for you.  If you have had past treatment for cervical cancer or a condition that could lead to cancer, you need Pap tests and screening for cancer for at least 20 years after your treatment. If Pap tests have been discontinued, your risk factors (such as having a new sexual partner) need to be reassessed to determine if  screening should resume. Some women have medical problems that increase the chance of getting cervical cancer. In these cases, your health care provider may recommend more frequent screening and Pap tests. Colorectal Cancer  This type of cancer can be detected and often prevented.  Routine colorectal cancer screening usually begins at 62 years of age and continues through 61 years of age.  Your health care provider may recommend screening at an earlier age if you have risk factors for colon cancer.  Your health care provider may also recommend using home test kits to check for hidden blood in the stool.  A small camera at the end of a tube can be used to examine your colon directly (sigmoidoscopy or colonoscopy). This is done to check for the earliest forms of colorectal cancer.  Routine screening usually begins at age 89.  Direct examination of the colon should be repeated every 5-10 years through 61 years of age. However, you may need to be screened more often if early forms of  precancerous polyps or small growths are found. Skin Cancer  Check your skin from head to toe regularly.  Tell your health care provider about any new moles or changes in moles, especially if there is a change in a mole's shape or color.  Also tell your health care provider if you have a mole that is larger than the size of a pencil eraser.  Always use sunscreen. Apply sunscreen liberally and repeatedly throughout the day.  Protect yourself by wearing long sleeves, pants, a wide-brimmed hat, and sunglasses whenever you are outside. Heart disease, diabetes, and high blood pressure  High blood pressure causes heart disease and increases the risk of stroke. High blood pressure is more likely to develop in: ? People who have blood pressure in the high end of the normal range (130-139/85-89 mm Hg). ? People who are overweight or obese. ? People who are African American.  If you are 16-53 years of age, have your  blood pressure checked every 3-5 years. If you are 20 years of age or older, have your blood pressure checked every year. You should have your blood pressure measured twice-once when you are at a hospital or clinic, and once when you are not at a hospital or clinic. Record the average of the two measurements. To check your blood pressure when you are not at a hospital or clinic, you can use: ? An automated blood pressure machine at a pharmacy. ? A home blood pressure monitor.  If you are between 43 years and 21 years old, ask your health care provider if you should take aspirin to prevent strokes.  Have regular diabetes screenings. This involves taking a blood sample to check your fasting blood sugar level. ? If you are at a normal weight and have a low risk for diabetes, have this test once every three years after 61 years of age. ? If you are overweight and have a high risk for diabetes, consider being tested at a younger age or more often. Preventing infection Hepatitis B  If you have a higher risk for hepatitis B, you should be screened for this virus. You are considered at high risk for hepatitis B if: ? You were born in a country where hepatitis B is common. Ask your health care provider which countries are considered high risk. ? Your parents were born in a high-risk country, and you have not been immunized against hepatitis B (hepatitis B vaccine). ? You have HIV or AIDS. ? You use needles to inject street drugs. ? You live with someone who has hepatitis B. ? You have had sex with someone who has hepatitis B. ? You get hemodialysis treatment. ? You take certain medicines for conditions, including cancer, organ transplantation, and autoimmune conditions. Hepatitis C  Blood testing is recommended for: ? Everyone born from [birth year] through 1963. ? Anyone with known risk factors for hepatitis C. Sexually transmitted infections (STIs)  You should be screened for sexually transmitted  infections (STIs) including gonorrhea and chlamydia if: ? You are sexually active and are younger than 61 years of age. ? You are older than 61 years of age and your health care provider tells you that you are at risk for this type of infection. ? Your sexual activity has changed since you were last screened and you are at an increased risk for chlamydia or gonorrhea. Ask your health care provider if you are at risk.  If you do not have HIV, but are at risk, it may  be recommended that you take a prescription medicine daily to prevent HIV infection. This is called pre-exposure prophylaxis (PrEP). You are considered at risk if: ? You are sexually active and do not regularly use condoms or know the HIV status of your partner(s). ? You take drugs by injection. ? You are sexually active with a partner who has HIV. Talk with your health care provider about whether you are at high risk of being infected with HIV. If you choose to begin PrEP, you should first be tested for HIV. You should then be tested every 3 months for as long as you are taking PrEP. Pregnancy  If you are premenopausal and you may become pregnant, ask your health care provider about preconception counseling.  If you may become pregnant, take 400 to 800 micrograms (mcg) of folic acid every day.  If you want to prevent pregnancy, talk to your health care provider about birth control (contraception). Osteoporosis and menopause  Osteoporosis is a disease in which the bones lose minerals and strength with aging. This can result in serious bone fractures. Your risk for osteoporosis can be identified using a bone density scan.  If you are 49 years of age or older, or if you are at risk for osteoporosis and fractures, ask your health care provider if you should be screened.  Ask your health care provider whether you should take a calcium or vitamin D supplement to lower your risk for osteoporosis.  Menopause may have certain physical  symptoms and risks.  Hormone replacement therapy may reduce some of these symptoms and risks. Talk to your health care provider about whether hormone replacement therapy is right for you. Follow these instructions at home:  Schedule regular health, dental, and eye exams.  Stay current with your immunizations.  Do not use any tobacco products including cigarettes, chewing tobacco, or electronic cigarettes.  If you are pregnant, do not drink alcohol.  If you are breastfeeding, limit how much and how often you drink alcohol.  Limit alcohol intake to no more than 1 drink per day for nonpregnant women. One drink equals 12 ounces of beer, 5 ounces of wine, or 1 ounces of hard liquor.  Do not use street drugs.  Do not share needles.  Ask your health care provider for help if you need support or information about quitting drugs.  Tell your health care provider if you often feel depressed.  Tell your health care provider if you have ever been abused or do not feel safe at home. This information is not intended to replace advice given to you by your health care provider. Make sure you discuss any questions you have with your health care provider. Document Released: 08/22/2010 Document Revised: 07/15/2015 Document Reviewed: 11/10/2014 Elsevier Interactive Patient Education  2019 [employer].

## 2018-03-27 NOTE — Assessment & Plan Note (Signed)
Was told by GI that she had a blood vessel near the anus that surgery would need to take care of.  She would like to have that removed Referral ordered for surgery

## 2018-03-27 NOTE — Assessment & Plan Note (Signed)
Following with derm  Taking clobetasol and finasteride Has second opinion with a different dermatologist

## 2018-03-28 ENCOUNTER — Encounter: Payer: Self-pay | Admitting: Internal Medicine

## 2018-03-28 ENCOUNTER — Encounter: Payer: Self-pay | Admitting: Gastroenterology

## 2018-04-04 ENCOUNTER — Other Ambulatory Visit: Payer: Self-pay | Admitting: Internal Medicine

## 2018-04-04 DIAGNOSIS — J301 Allergic rhinitis due to pollen: Secondary | ICD-10-CM | POA: Diagnosis not present

## 2018-04-04 DIAGNOSIS — J3081 Allergic rhinitis due to animal (cat) (dog) hair and dander: Secondary | ICD-10-CM | POA: Diagnosis not present

## 2018-04-04 DIAGNOSIS — Z1231 Encounter for screening mammogram for malignant neoplasm of breast: Secondary | ICD-10-CM

## 2018-04-04 DIAGNOSIS — J3089 Other allergic rhinitis: Secondary | ICD-10-CM | POA: Diagnosis not present

## 2018-04-04 MED FILL — EPINEPHRINE 0.3 MG AUTO-INJ: 0.3 | 30 days supply | Qty: 2 | Fill #0

## 2018-04-10 DIAGNOSIS — L661 Lichen planopilaris: Secondary | ICD-10-CM | POA: Diagnosis not present

## 2018-04-19 ENCOUNTER — Encounter: Payer: Self-pay | Admitting: Internal Medicine

## 2018-04-20 ENCOUNTER — Encounter: Payer: Self-pay | Admitting: Gastroenterology

## 2018-04-22 DIAGNOSIS — J301 Allergic rhinitis due to pollen: Secondary | ICD-10-CM | POA: Diagnosis not present

## 2018-04-22 DIAGNOSIS — J3081 Allergic rhinitis due to animal (cat) (dog) hair and dander: Secondary | ICD-10-CM | POA: Diagnosis not present

## 2018-04-22 DIAGNOSIS — J3089 Other allergic rhinitis: Secondary | ICD-10-CM | POA: Diagnosis not present

## 2018-04-23 DIAGNOSIS — J3081 Allergic rhinitis due to animal (cat) (dog) hair and dander: Secondary | ICD-10-CM | POA: Diagnosis not present

## 2018-04-23 DIAGNOSIS — J3089 Other allergic rhinitis: Secondary | ICD-10-CM | POA: Diagnosis not present

## 2018-04-26 ENCOUNTER — Encounter: Payer: Self-pay | Admitting: Genetic Counselor

## 2018-05-06 DIAGNOSIS — J3081 Allergic rhinitis due to animal (cat) (dog) hair and dander: Secondary | ICD-10-CM | POA: Diagnosis not present

## 2018-05-06 DIAGNOSIS — J301 Allergic rhinitis due to pollen: Secondary | ICD-10-CM | POA: Diagnosis not present

## 2018-05-06 DIAGNOSIS — J3089 Other allergic rhinitis: Secondary | ICD-10-CM | POA: Diagnosis not present

## 2018-05-24 DIAGNOSIS — J3081 Allergic rhinitis due to animal (cat) (dog) hair and dander: Secondary | ICD-10-CM | POA: Diagnosis not present

## 2018-05-24 DIAGNOSIS — J3089 Other allergic rhinitis: Secondary | ICD-10-CM | POA: Diagnosis not present

## 2018-05-24 DIAGNOSIS — J301 Allergic rhinitis due to pollen: Secondary | ICD-10-CM | POA: Diagnosis not present

## 2018-05-27 DIAGNOSIS — J3081 Allergic rhinitis due to animal (cat) (dog) hair and dander: Secondary | ICD-10-CM | POA: Diagnosis not present

## 2018-05-27 DIAGNOSIS — J3089 Other allergic rhinitis: Secondary | ICD-10-CM | POA: Diagnosis not present

## 2018-05-27 DIAGNOSIS — J301 Allergic rhinitis due to pollen: Secondary | ICD-10-CM | POA: Diagnosis not present

## 2018-05-30 DIAGNOSIS — J3081 Allergic rhinitis due to animal (cat) (dog) hair and dander: Secondary | ICD-10-CM | POA: Diagnosis not present

## 2018-05-30 DIAGNOSIS — J3089 Other allergic rhinitis: Secondary | ICD-10-CM | POA: Diagnosis not present

## 2018-05-30 DIAGNOSIS — J301 Allergic rhinitis due to pollen: Secondary | ICD-10-CM | POA: Diagnosis not present

## 2018-06-04 DIAGNOSIS — J301 Allergic rhinitis due to pollen: Secondary | ICD-10-CM | POA: Diagnosis not present

## 2018-06-04 DIAGNOSIS — J3081 Allergic rhinitis due to animal (cat) (dog) hair and dander: Secondary | ICD-10-CM | POA: Diagnosis not present

## 2018-06-04 DIAGNOSIS — J3089 Other allergic rhinitis: Secondary | ICD-10-CM | POA: Diagnosis not present

## 2018-06-06 DIAGNOSIS — J3089 Other allergic rhinitis: Secondary | ICD-10-CM | POA: Diagnosis not present

## 2018-06-06 DIAGNOSIS — J301 Allergic rhinitis due to pollen: Secondary | ICD-10-CM | POA: Diagnosis not present

## 2018-06-06 DIAGNOSIS — J3081 Allergic rhinitis due to animal (cat) (dog) hair and dander: Secondary | ICD-10-CM | POA: Diagnosis not present

## 2018-06-12 DIAGNOSIS — H1045 Other chronic allergic conjunctivitis: Secondary | ICD-10-CM | POA: Diagnosis not present

## 2018-06-12 DIAGNOSIS — J3081 Allergic rhinitis due to animal (cat) (dog) hair and dander: Secondary | ICD-10-CM | POA: Diagnosis not present

## 2018-06-12 DIAGNOSIS — J301 Allergic rhinitis due to pollen: Secondary | ICD-10-CM | POA: Diagnosis not present

## 2018-06-12 DIAGNOSIS — J3089 Other allergic rhinitis: Secondary | ICD-10-CM | POA: Diagnosis not present

## 2018-06-13 ENCOUNTER — Encounter: Payer: Self-pay | Admitting: Internal Medicine

## 2018-06-13 NOTE — Telephone Encounter (Signed)
Please order a cologuard for her.

## 2018-06-14 ENCOUNTER — Other Ambulatory Visit: Payer: Self-pay

## 2018-06-14 MED ORDER — ESTRADIOL 0.025 MG/24HR TD PTTW: MEDICATED_PATCH | TRANSDERMAL | 3 refills | Status: DC

## 2018-06-19 ENCOUNTER — Encounter: Payer: Self-pay | Admitting: Internal Medicine

## 2018-06-21 DIAGNOSIS — J3081 Allergic rhinitis due to animal (cat) (dog) hair and dander: Secondary | ICD-10-CM | POA: Diagnosis not present

## 2018-06-21 DIAGNOSIS — J301 Allergic rhinitis due to pollen: Secondary | ICD-10-CM | POA: Diagnosis not present

## 2018-06-21 DIAGNOSIS — J3089 Other allergic rhinitis: Secondary | ICD-10-CM | POA: Diagnosis not present

## 2018-06-24 MED FILL — DOTTI 0.025 MG/24HR PTTW: 0.025 | 84 days supply | Qty: 24 | Fill #0

## 2018-07-04 DIAGNOSIS — J3081 Allergic rhinitis due to animal (cat) (dog) hair and dander: Secondary | ICD-10-CM | POA: Diagnosis not present

## 2018-07-04 DIAGNOSIS — J301 Allergic rhinitis due to pollen: Secondary | ICD-10-CM | POA: Diagnosis not present

## 2018-07-04 DIAGNOSIS — J3089 Other allergic rhinitis: Secondary | ICD-10-CM | POA: Diagnosis not present

## 2018-07-08 ENCOUNTER — Other Ambulatory Visit: Payer: Self-pay | Admitting: Internal Medicine

## 2018-07-08 MED FILL — HYDROCORTISONE ACETATE 25 M: 25 | 30 days supply | Qty: 60 | Fill #0

## 2018-07-08 MED FILL — FINASTERIDE 5 MG TABLET: 5 | 90 days supply | Qty: 45 | Fill #3

## 2018-07-10 ENCOUNTER — Encounter: Payer: Self-pay | Admitting: Internal Medicine

## 2018-07-19 DIAGNOSIS — J3089 Other allergic rhinitis: Secondary | ICD-10-CM | POA: Diagnosis not present

## 2018-07-19 DIAGNOSIS — J3081 Allergic rhinitis due to animal (cat) (dog) hair and dander: Secondary | ICD-10-CM | POA: Diagnosis not present

## 2018-07-19 DIAGNOSIS — J301 Allergic rhinitis due to pollen: Secondary | ICD-10-CM | POA: Diagnosis not present

## 2018-08-01 DIAGNOSIS — J3081 Allergic rhinitis due to animal (cat) (dog) hair and dander: Secondary | ICD-10-CM | POA: Diagnosis not present

## 2018-08-01 DIAGNOSIS — J3089 Other allergic rhinitis: Secondary | ICD-10-CM | POA: Diagnosis not present

## 2018-08-01 DIAGNOSIS — L661 Lichen planopilaris: Secondary | ICD-10-CM | POA: Diagnosis not present

## 2018-08-01 DIAGNOSIS — L658 Other specified nonscarring hair loss: Secondary | ICD-10-CM | POA: Diagnosis not present

## 2018-08-01 DIAGNOSIS — J301 Allergic rhinitis due to pollen: Secondary | ICD-10-CM | POA: Diagnosis not present

## 2018-08-14 DIAGNOSIS — J301 Allergic rhinitis due to pollen: Secondary | ICD-10-CM | POA: Diagnosis not present

## 2018-08-14 DIAGNOSIS — J3081 Allergic rhinitis due to animal (cat) (dog) hair and dander: Secondary | ICD-10-CM | POA: Diagnosis not present

## 2018-08-14 DIAGNOSIS — J3089 Other allergic rhinitis: Secondary | ICD-10-CM | POA: Diagnosis not present

## 2018-08-27 DIAGNOSIS — J3081 Allergic rhinitis due to animal (cat) (dog) hair and dander: Secondary | ICD-10-CM | POA: Diagnosis not present

## 2018-08-27 DIAGNOSIS — J3089 Other allergic rhinitis: Secondary | ICD-10-CM | POA: Diagnosis not present

## 2018-08-27 DIAGNOSIS — J301 Allergic rhinitis due to pollen: Secondary | ICD-10-CM | POA: Diagnosis not present

## 2018-09-10 DIAGNOSIS — J3081 Allergic rhinitis due to animal (cat) (dog) hair and dander: Secondary | ICD-10-CM | POA: Diagnosis not present

## 2018-09-10 DIAGNOSIS — J301 Allergic rhinitis due to pollen: Secondary | ICD-10-CM | POA: Diagnosis not present

## 2018-09-10 DIAGNOSIS — J3089 Other allergic rhinitis: Secondary | ICD-10-CM | POA: Diagnosis not present

## 2018-09-26 MED FILL — DOTTI 0.025 MG/24HR PTTW: 0.025 | 84 days supply | Qty: 24 | Fill #1

## 2018-10-01 DIAGNOSIS — J3089 Other allergic rhinitis: Secondary | ICD-10-CM | POA: Diagnosis not present

## 2018-10-01 DIAGNOSIS — J3081 Allergic rhinitis due to animal (cat) (dog) hair and dander: Secondary | ICD-10-CM | POA: Diagnosis not present

## 2018-10-01 DIAGNOSIS — J301 Allergic rhinitis due to pollen: Secondary | ICD-10-CM | POA: Diagnosis not present

## 2018-10-04 MED FILL — FINASTERIDE 5 MG TABLET: 5 | 80 days supply | Qty: 40 | Fill #0

## 2018-10-15 ENCOUNTER — Ambulatory Visit (INDEPENDENT_AMBULATORY_CARE_PROVIDER_SITE_OTHER): Payer: 59 | Admitting: Psychology

## 2018-10-15 DIAGNOSIS — F411 Generalized anxiety disorder: Secondary | ICD-10-CM

## 2018-10-18 DIAGNOSIS — J3089 Other allergic rhinitis: Secondary | ICD-10-CM | POA: Diagnosis not present

## 2018-10-18 DIAGNOSIS — J3081 Allergic rhinitis due to animal (cat) (dog) hair and dander: Secondary | ICD-10-CM | POA: Diagnosis not present

## 2018-10-18 DIAGNOSIS — J301 Allergic rhinitis due to pollen: Secondary | ICD-10-CM | POA: Diagnosis not present

## 2018-10-22 DIAGNOSIS — R944 Abnormal results of kidney function studies: Secondary | ICD-10-CM | POA: Diagnosis not present

## 2018-10-22 DIAGNOSIS — Q600 Renal agenesis, unilateral: Secondary | ICD-10-CM | POA: Diagnosis not present

## 2018-10-24 DIAGNOSIS — Q600 Renal agenesis, unilateral: Secondary | ICD-10-CM | POA: Diagnosis not present

## 2018-10-24 DIAGNOSIS — L659 Nonscarring hair loss, unspecified: Secondary | ICD-10-CM | POA: Diagnosis not present

## 2018-10-24 DIAGNOSIS — E785 Hyperlipidemia, unspecified: Secondary | ICD-10-CM | POA: Diagnosis not present

## 2018-10-24 DIAGNOSIS — N139 Obstructive and reflux uropathy, unspecified: Secondary | ICD-10-CM | POA: Diagnosis not present

## 2018-10-24 DIAGNOSIS — R944 Abnormal results of kidney function studies: Secondary | ICD-10-CM | POA: Diagnosis not present

## 2018-10-29 ENCOUNTER — Encounter: Payer: Self-pay | Admitting: Gastroenterology

## 2018-10-29 ENCOUNTER — Encounter: Payer: Self-pay | Admitting: Internal Medicine

## 2018-10-29 DIAGNOSIS — M85861 Other specified disorders of bone density and structure, right lower leg: Secondary | ICD-10-CM

## 2018-10-29 DIAGNOSIS — M85862 Other specified disorders of bone density and structure, left lower leg: Secondary | ICD-10-CM

## 2018-10-29 DIAGNOSIS — Z803 Family history of malignant neoplasm of breast: Secondary | ICD-10-CM

## 2018-10-29 DIAGNOSIS — Z1231 Encounter for screening mammogram for malignant neoplasm of breast: Secondary | ICD-10-CM

## 2018-10-30 ENCOUNTER — Ambulatory Visit (INDEPENDENT_AMBULATORY_CARE_PROVIDER_SITE_OTHER): Payer: 59 | Admitting: Psychology

## 2018-10-30 ENCOUNTER — Encounter: Payer: Self-pay | Admitting: Internal Medicine

## 2018-10-30 DIAGNOSIS — F411 Generalized anxiety disorder: Secondary | ICD-10-CM | POA: Diagnosis not present

## 2018-10-31 ENCOUNTER — Ambulatory Visit: Payer: 59 | Admitting: Psychology

## 2018-11-01 DIAGNOSIS — J301 Allergic rhinitis due to pollen: Secondary | ICD-10-CM | POA: Diagnosis not present

## 2018-11-01 DIAGNOSIS — J3081 Allergic rhinitis due to animal (cat) (dog) hair and dander: Secondary | ICD-10-CM | POA: Diagnosis not present

## 2018-11-01 DIAGNOSIS — J3089 Other allergic rhinitis: Secondary | ICD-10-CM | POA: Diagnosis not present

## 2018-11-05 ENCOUNTER — Ambulatory Visit: Payer: 59 | Admitting: Psychology

## 2018-11-11 ENCOUNTER — Ambulatory Visit (INDEPENDENT_AMBULATORY_CARE_PROVIDER_SITE_OTHER): Payer: 59 | Admitting: Psychology

## 2018-11-11 DIAGNOSIS — F411 Generalized anxiety disorder: Secondary | ICD-10-CM

## 2018-11-14 ENCOUNTER — Ambulatory Visit: Payer: 59 | Admitting: Psychology

## 2018-11-15 ENCOUNTER — Ambulatory Visit (AMBULATORY_SURGERY_CENTER): Payer: Self-pay | Admitting: *Deleted

## 2018-11-15 ENCOUNTER — Encounter: Payer: Self-pay | Admitting: Internal Medicine

## 2018-11-15 ENCOUNTER — Encounter: Payer: Self-pay | Admitting: Gastroenterology

## 2018-11-15 ENCOUNTER — Other Ambulatory Visit: Payer: Self-pay

## 2018-11-15 VITALS — Temp 96.9°F | Ht 65.0 in | Wt 155.6 lb

## 2018-11-15 DIAGNOSIS — Z1211 Encounter for screening for malignant neoplasm of colon: Secondary | ICD-10-CM

## 2018-11-15 MED ORDER — NA SULFATE-K SULFATE-MG SULF 17.5-3.13-1.6 GM/177ML PO SOLN: ORAL | 0 refills | Status: DC

## 2018-11-15 MED FILL — SUPREP BOWEL PREP KIT: 17.5-3.13-1 | 2 days supply | Qty: 354 | Fill #0

## 2018-11-15 NOTE — Progress Notes (Signed)
Patient is here in-person for PV. Patient denies any allergies to eggs or soy. Patient denies any problems with anesthesia/sedation. Patient denies any oxygen use at home. Patient denies taking any diet/weight loss medications or blood thinners. Patient is not being treated for MRSA or C-diff. EMMI education assisgned to patient on colonoscopy, this was explained and instructions given to patient.  Suprep $15 off coupon given.  Pt is aware that care partner will wait in the car during procedure; if they feel like they will be too hot to wait in the car; they may wait in the lobby. Patient is aware to bring only one care partner. We want them to wear a mask (we do not have any that we can provide them), practice social distancing, and we will check their temperatures when they get here.  I did remind patient that their care partner needs to stay in the parking lot the entire time. Pt will wear mask into building.

## 2018-11-19 ENCOUNTER — Ambulatory Visit (INDEPENDENT_AMBULATORY_CARE_PROVIDER_SITE_OTHER): Payer: 59 | Admitting: Psychology

## 2018-11-19 DIAGNOSIS — F411 Generalized anxiety disorder: Secondary | ICD-10-CM | POA: Diagnosis not present

## 2018-11-21 ENCOUNTER — Ambulatory Visit: Payer: 59 | Admitting: Psychology

## 2018-11-21 DIAGNOSIS — J301 Allergic rhinitis due to pollen: Secondary | ICD-10-CM | POA: Diagnosis not present

## 2018-11-21 DIAGNOSIS — J3081 Allergic rhinitis due to animal (cat) (dog) hair and dander: Secondary | ICD-10-CM | POA: Diagnosis not present

## 2018-11-21 DIAGNOSIS — J3089 Other allergic rhinitis: Secondary | ICD-10-CM | POA: Diagnosis not present

## 2018-11-26 ENCOUNTER — Ambulatory Visit (INDEPENDENT_AMBULATORY_CARE_PROVIDER_SITE_OTHER): Payer: 59 | Admitting: Psychology

## 2018-11-26 DIAGNOSIS — F411 Generalized anxiety disorder: Secondary | ICD-10-CM | POA: Diagnosis not present

## 2018-11-28 ENCOUNTER — Telehealth: Payer: Self-pay

## 2018-11-28 NOTE — Telephone Encounter (Signed)
Pt responded "no" to all screening questions °

## 2018-11-28 NOTE — Telephone Encounter (Signed)
Covid-19 screening questions   Do you now or have you had a fever in the last 14 days?  Do you have any respiratory symptoms of shortness of breath or cough now or in the last 14 days?  Do you have any family members or close contacts with diagnosed or suspected Covid-19 in the past 14 days?  Have you been tested for Covid-19 and found to be positive?       

## 2018-11-29 ENCOUNTER — Ambulatory Visit (AMBULATORY_SURGERY_CENTER): Payer: 59 | Admitting: Gastroenterology

## 2018-11-29 ENCOUNTER — Encounter: Payer: Self-pay | Admitting: Gastroenterology

## 2018-11-29 ENCOUNTER — Other Ambulatory Visit: Payer: Self-pay

## 2018-11-29 VITALS — BP 164/81 | HR 59 | Temp 97.5°F | Resp 20 | Ht 65.0 in | Wt 155.0 lb

## 2018-11-29 DIAGNOSIS — F419 Anxiety disorder, unspecified: Secondary | ICD-10-CM | POA: Diagnosis not present

## 2018-11-29 DIAGNOSIS — Z1211 Encounter for screening for malignant neoplasm of colon: Secondary | ICD-10-CM | POA: Diagnosis not present

## 2018-11-29 MED ORDER — SODIUM CHLORIDE 0.9 % IV SOLN: 500.0000 mL | INTRAVENOUS | Status: DC

## 2018-11-29 NOTE — Patient Instructions (Signed)
YOU HAD AN ENDOSCOPIC PROCEDURE TODAY AT THE [facility]:   Refer to the procedure report that was given to you for any specific questions about what was found during the examination.  If the procedure report does not answer your questions, please call your gastroenterologist to clarify.  If you requested that your care partner not be given the details of your procedure findings, then the procedure report has been included in a sealed envelope for you to review at your convenience later.  YOU SHOULD EXPECT: Some feelings of bloating in the abdomen. Passage of more gas than usual.  Walking can help get rid of the air that was put into your GI tract during the procedure and reduce the bloating. If you had a lower endoscopy (such as a colonoscopy or flexible sigmoidoscopy) you may notice spotting of blood in your stool or on the toilet paper. If you underwent a bowel prep for your procedure, you may not have a normal bowel movement for a few days.  Please Note:  You might notice some irritation and congestion in your nose or some drainage.  This is from the oxygen used during your procedure.  There is no need for concern and it should clear up in a day or so.  SYMPTOMS TO REPORT IMMEDIATELY:   Following lower endoscopy (colonoscopy or flexible sigmoidoscopy):  Excessive amounts of blood in the stool  Significant tenderness or worsening of abdominal pains  Swelling of the abdomen that is new, acute  Fever of 100F or higher  For urgent or emergent issues, a gastroenterologist can be reached at any hour by calling [phone].   DIET:  We do recommend a small meal at first, but then you may proceed to your regular diet.  Drink plenty of fluids but you should avoid alcoholic beverages for 24 hours.  ACTIVITY:  You should plan to take it easy for the rest of today and you should NOT DRIVE or use heavy machinery until tomorrow (because of the sedation medicines used during the test).     FOLLOW UP: Our staff will call the number listed on your records 48-72 hours following your procedure to check on you and address any questions or concerns that you may have regarding the information given to you following your procedure. If we do not reach you, we will leave a message.  We will attempt to reach you two times.  During this call, we will ask if you have developed any symptoms of COVID 19. If you develop any symptoms (ie: fever, flu-like symptoms, shortness of breath, cough etc.) before then, please call [phone].  If you test positive for Covid 19 in the 2 weeks post procedure, please call and report this information to us.    If any biopsies were taken you will be contacted by phone or by letter within the next 1-3 weeks.  Please call us at [phone] if you have not heard about the biopsies in 3 weeks.    SIGNATURES/CONFIDENTIALITY: You and/or your care partner have signed paperwork which will be entered into your electronic medical record.  These signatures attest to the fact that that the information above on your After Visit Summary has been reviewed and is understood.  Full responsibility of the confidentiality of this discharge information lies with you and/or your care-partner. 

## 2018-11-29 NOTE — Progress Notes (Signed)
Temp by JB and vitals by [place]

## 2018-11-29 NOTE — Op Note (Signed)
[facility] Patient Name: Maria Kane Procedure Date: 11/29/2018 8:49 AM MRN: [id number] Endoscopist: Milus Banister , MD Age: 63 Referring MD:  Date of Birth: 14-Oct-1955 Gender: Female Account #: [id number] Procedure:                Colonoscopy Indications:              Screening for colorectal malignant neoplasm Medicines:                Monitored Anesthesia Care Procedure:                Pre-Anesthesia Assessment:                           - Prior to the procedure, a History and Physical                            was performed, and patient medications and                            allergies were reviewed. The patient's tolerance of                            previous anesthesia was also reviewed. The risks                            and benefits of the procedure and the sedation                            options and risks were discussed with the patient.                            All questions were answered, and informed consent                            was obtained. Prior Anticoagulants: The patient has                            taken no previous anticoagulant or antiplatelet                            agents. ASA Grade Assessment: II - A patient with                            mild systemic disease. After reviewing the risks                            and benefits, the patient was deemed in                            satisfactory condition to undergo the procedure.                           After obtaining informed consent, the colonoscope  was passed under direct vision. Throughout the                            procedure, the patient's blood pressure, pulse, and                            oxygen saturations were monitored continuously. The                            Colonoscope was introduced through the anus and                            advanced to the the cecum, identified by                            appendiceal orifice and  ileocecal valve. The                            colonoscopy was performed without difficulty. The                            patient tolerated the procedure well. The quality                            of the bowel preparation was good. The ileocecal                            valve, appendiceal orifice, and rectum were                            photographed. Scope In: 8:55:18 AM Scope Out: 9:08:50 AM Scope Withdrawal Time: 0 hours 6 minutes 29 seconds  Total Procedure Duration: 0 hours 13 minutes 32 seconds  Findings:                 External and internal hemorrhoids were found. The                            hemorrhoids were small.                           The exam was otherwise without abnormality on                            direct and retroflexion views. Complications:            No immediate complications. Estimated blood loss:                            None. Estimated Blood Loss:     Estimated blood loss: none. Impression:               - External and internal hemorrhoids.                           - The examination was otherwise normal on direct  and retroflexion views.                           - No polyps or cancers. Recommendation:           - Patient has a contact number available for                            emergencies. The signs and symptoms of potential                            delayed complications were discussed with the                            patient. Return to normal activities tomorrow.                            Written discharge instructions were provided to the                            patient.                           - Resume previous diet.                           - Continue present medications.                           - Repeat colonoscopy in 10 years for screening. Milus Banister, MD 11/29/2018 9:11:47 AM This report has been signed electronically.

## 2018-11-29 NOTE — Progress Notes (Signed)
Report to PACU, RN, vss, BBS= Clear.  

## 2018-12-02 ENCOUNTER — Ambulatory Visit (INDEPENDENT_AMBULATORY_CARE_PROVIDER_SITE_OTHER): Payer: 59 | Admitting: Psychology

## 2018-12-02 DIAGNOSIS — F411 Generalized anxiety disorder: Secondary | ICD-10-CM | POA: Diagnosis not present

## 2018-12-03 ENCOUNTER — Telehealth: Payer: Self-pay

## 2018-12-03 ENCOUNTER — Telehealth: Payer: Self-pay | Admitting: *Deleted

## 2018-12-03 NOTE — Telephone Encounter (Signed)
1. Have you developed a fever since your procedure? no  2.   Have you had an respiratory symptoms (SOB or cough) since your procedure? no  3.   Have you tested positive for COVID 19 since your procedure no  4.   Have you had any family members/close contacts diagnosed with the COVID 19 since your procedure?  no   If yes to any of these questions please route to Joylene John, RN and Alphonsa Gin, Therapist, sports.     Follow up Call-  Call back number 11/29/2018  Post procedure Call Back phone  # [phone]  Permission to leave phone message Yes  Some recent data might be hidden     Patient questions:  Do you have a fever, pain , or abdominal swelling? No. Pain Score  0 *  Have you tolerated food without any problems? Yes.    Have you been able to return to your normal activities? Yes.    Do you have any questions about your discharge instructions: Diet   No. Medications  No. Follow up visit  No.  Do you have questions or concerns about your Care? No.  Actions: * If pain score is 4 or above: No action needed, pain <4.

## 2018-12-03 NOTE — Telephone Encounter (Signed)
Follow up call made.  NALM 

## 2018-12-05 ENCOUNTER — Other Ambulatory Visit: Payer: Self-pay | Admitting: Internal Medicine

## 2018-12-05 MED FILL — PROCTOZONE-HC 2.5 % CREA: 2.5 | 15 days supply | Qty: 30 | Fill #0

## 2018-12-09 DIAGNOSIS — J3081 Allergic rhinitis due to animal (cat) (dog) hair and dander: Secondary | ICD-10-CM | POA: Diagnosis not present

## 2018-12-09 DIAGNOSIS — J301 Allergic rhinitis due to pollen: Secondary | ICD-10-CM | POA: Diagnosis not present

## 2018-12-09 DIAGNOSIS — J3089 Other allergic rhinitis: Secondary | ICD-10-CM | POA: Diagnosis not present

## 2018-12-17 ENCOUNTER — Ambulatory Visit (INDEPENDENT_AMBULATORY_CARE_PROVIDER_SITE_OTHER): Payer: 59 | Admitting: Psychology

## 2018-12-17 DIAGNOSIS — F411 Generalized anxiety disorder: Secondary | ICD-10-CM | POA: Diagnosis not present

## 2018-12-18 DIAGNOSIS — L661 Lichen planopilaris: Secondary | ICD-10-CM | POA: Diagnosis not present

## 2018-12-18 MED FILL — TACROLIMUS 0.1 % OINT: 0.1 | 30 days supply | Qty: 60 | Fill #0

## 2018-12-18 MED FILL — FINASTERIDE 5 MG TABLET: 5 | 90 days supply | Qty: 45 | Fill #0

## 2018-12-18 MED FILL — PROCTOZONE-HC 2.5 % CREA: 2.5 | 15 days supply | Qty: 30 | Fill #0

## 2018-12-23 DIAGNOSIS — M8589 Other specified disorders of bone density and structure, multiple sites: Secondary | ICD-10-CM | POA: Diagnosis not present

## 2018-12-23 DIAGNOSIS — Z90710 Acquired absence of both cervix and uterus: Secondary | ICD-10-CM | POA: Diagnosis not present

## 2018-12-23 DIAGNOSIS — R29890 Loss of height: Secondary | ICD-10-CM | POA: Diagnosis not present

## 2018-12-23 DIAGNOSIS — Z1231 Encounter for screening mammogram for malignant neoplasm of breast: Secondary | ICD-10-CM | POA: Diagnosis not present

## 2018-12-23 DIAGNOSIS — Z803 Family history of malignant neoplasm of breast: Secondary | ICD-10-CM | POA: Diagnosis not present

## 2018-12-23 LAB — HM MAMMOGRAPHY

## 2018-12-24 DIAGNOSIS — J3081 Allergic rhinitis due to animal (cat) (dog) hair and dander: Secondary | ICD-10-CM | POA: Diagnosis not present

## 2018-12-24 DIAGNOSIS — J3089 Other allergic rhinitis: Secondary | ICD-10-CM | POA: Diagnosis not present

## 2018-12-24 DIAGNOSIS — J301 Allergic rhinitis due to pollen: Secondary | ICD-10-CM | POA: Diagnosis not present

## 2018-12-26 ENCOUNTER — Encounter: Payer: Self-pay | Admitting: Internal Medicine

## 2018-12-26 ENCOUNTER — Ambulatory Visit (INDEPENDENT_AMBULATORY_CARE_PROVIDER_SITE_OTHER): Payer: 59 | Admitting: Psychology

## 2018-12-26 DIAGNOSIS — F411 Generalized anxiety disorder: Secondary | ICD-10-CM | POA: Diagnosis not present

## 2018-12-27 MED FILL — DOTTI 0.025 MG/24HR PTTW: 0.025 | 84 days supply | Qty: 24 | Fill #2

## 2019-01-01 ENCOUNTER — Ambulatory Visit: Payer: 59 | Admitting: Psychology

## 2019-01-02 ENCOUNTER — Ambulatory Visit: Payer: 59 | Admitting: Psychology

## 2019-01-03 ENCOUNTER — Encounter: Payer: Self-pay | Admitting: Internal Medicine

## 2019-01-06 ENCOUNTER — Other Ambulatory Visit: Payer: 59

## 2019-01-09 ENCOUNTER — Ambulatory Visit: Payer: 59 | Admitting: Psychology

## 2019-01-14 DIAGNOSIS — J301 Allergic rhinitis due to pollen: Secondary | ICD-10-CM | POA: Diagnosis not present

## 2019-01-14 DIAGNOSIS — J3081 Allergic rhinitis due to animal (cat) (dog) hair and dander: Secondary | ICD-10-CM | POA: Diagnosis not present

## 2019-01-14 DIAGNOSIS — J3089 Other allergic rhinitis: Secondary | ICD-10-CM | POA: Diagnosis not present

## 2019-01-20 ENCOUNTER — Ambulatory Visit (INDEPENDENT_AMBULATORY_CARE_PROVIDER_SITE_OTHER): Payer: 59 | Admitting: Psychology

## 2019-01-20 DIAGNOSIS — F411 Generalized anxiety disorder: Secondary | ICD-10-CM | POA: Diagnosis not present

## 2019-01-28 DIAGNOSIS — J301 Allergic rhinitis due to pollen: Secondary | ICD-10-CM | POA: Diagnosis not present

## 2019-01-28 DIAGNOSIS — J3081 Allergic rhinitis due to animal (cat) (dog) hair and dander: Secondary | ICD-10-CM | POA: Diagnosis not present

## 2019-01-28 DIAGNOSIS — J3089 Other allergic rhinitis: Secondary | ICD-10-CM | POA: Diagnosis not present

## 2019-01-30 ENCOUNTER — Ambulatory Visit: Payer: 59 | Admitting: Psychology

## 2019-02-06 ENCOUNTER — Ambulatory Visit (INDEPENDENT_AMBULATORY_CARE_PROVIDER_SITE_OTHER): Payer: 59 | Admitting: Psychology

## 2019-02-06 DIAGNOSIS — F411 Generalized anxiety disorder: Secondary | ICD-10-CM | POA: Diagnosis not present

## 2019-02-07 DIAGNOSIS — J301 Allergic rhinitis due to pollen: Secondary | ICD-10-CM | POA: Diagnosis not present

## 2019-02-10 DIAGNOSIS — J3089 Other allergic rhinitis: Secondary | ICD-10-CM | POA: Diagnosis not present

## 2019-02-10 DIAGNOSIS — J3081 Allergic rhinitis due to animal (cat) (dog) hair and dander: Secondary | ICD-10-CM | POA: Diagnosis not present

## 2019-02-11 DIAGNOSIS — J3089 Other allergic rhinitis: Secondary | ICD-10-CM | POA: Diagnosis not present

## 2019-02-11 DIAGNOSIS — J3081 Allergic rhinitis due to animal (cat) (dog) hair and dander: Secondary | ICD-10-CM | POA: Diagnosis not present

## 2019-02-11 DIAGNOSIS — J301 Allergic rhinitis due to pollen: Secondary | ICD-10-CM | POA: Diagnosis not present

## 2019-02-24 ENCOUNTER — Encounter: Payer: Self-pay | Admitting: Internal Medicine

## 2019-02-25 ENCOUNTER — Encounter: Payer: Self-pay | Admitting: Internal Medicine

## 2019-02-25 DIAGNOSIS — J3089 Other allergic rhinitis: Secondary | ICD-10-CM | POA: Diagnosis not present

## 2019-02-25 DIAGNOSIS — J3081 Allergic rhinitis due to animal (cat) (dog) hair and dander: Secondary | ICD-10-CM | POA: Diagnosis not present

## 2019-02-25 DIAGNOSIS — J301 Allergic rhinitis due to pollen: Secondary | ICD-10-CM | POA: Diagnosis not present

## 2019-02-25 MED ORDER — TRAZODONE HCL 50 MG PO TABS: 25.0000 mg | ORAL_TABLET | Freq: Every evening | ORAL | 3 refills | Status: DC | PRN

## 2019-02-25 MED FILL — traZODone HCL 50 MG TABS: 50 | 30 days supply | Qty: 30 | Fill #0

## 2019-03-04 MED FILL — ESTRADIOL 0.025 MG PATCH: 0.025 | 84 days supply | Qty: 24 | Fill #3

## 2019-03-13 DIAGNOSIS — J3089 Other allergic rhinitis: Secondary | ICD-10-CM | POA: Diagnosis not present

## 2019-03-13 DIAGNOSIS — J301 Allergic rhinitis due to pollen: Secondary | ICD-10-CM | POA: Diagnosis not present

## 2019-03-13 DIAGNOSIS — J3081 Allergic rhinitis due to animal (cat) (dog) hair and dander: Secondary | ICD-10-CM | POA: Diagnosis not present

## 2019-03-17 DIAGNOSIS — J301 Allergic rhinitis due to pollen: Secondary | ICD-10-CM | POA: Diagnosis not present

## 2019-03-17 DIAGNOSIS — J3089 Other allergic rhinitis: Secondary | ICD-10-CM | POA: Diagnosis not present

## 2019-03-17 DIAGNOSIS — J3081 Allergic rhinitis due to animal (cat) (dog) hair and dander: Secondary | ICD-10-CM | POA: Diagnosis not present

## 2019-03-19 DIAGNOSIS — J3081 Allergic rhinitis due to animal (cat) (dog) hair and dander: Secondary | ICD-10-CM | POA: Diagnosis not present

## 2019-03-19 DIAGNOSIS — J301 Allergic rhinitis due to pollen: Secondary | ICD-10-CM | POA: Diagnosis not present

## 2019-03-19 DIAGNOSIS — J3089 Other allergic rhinitis: Secondary | ICD-10-CM | POA: Diagnosis not present

## 2019-03-24 DIAGNOSIS — J301 Allergic rhinitis due to pollen: Secondary | ICD-10-CM | POA: Diagnosis not present

## 2019-03-24 DIAGNOSIS — J3081 Allergic rhinitis due to animal (cat) (dog) hair and dander: Secondary | ICD-10-CM | POA: Diagnosis not present

## 2019-03-24 DIAGNOSIS — J3089 Other allergic rhinitis: Secondary | ICD-10-CM | POA: Diagnosis not present

## 2019-03-25 ENCOUNTER — Encounter: Payer: Self-pay | Admitting: Internal Medicine

## 2019-03-25 MED ORDER — ALBUTEROL SULFATE HFA 108 (90 BASE) MCG/ACT IN AERS: 2.0000 | INHALATION_SPRAY | Freq: Four times a day (QID) | RESPIRATORY_TRACT | 0 refills | Status: DC | PRN

## 2019-03-25 MED FILL — ALBUTEROL SULFATE HFA 108 (: 108 (90 BAS | 25 days supply | Qty: 18 | Fill #0

## 2019-03-26 MED ORDER — BUDESONIDE-FORMOTEROL FUMARATE 80-4.5 MCG/ACT IN AERO: 2.0000 | INHALATION_SPRAY | Freq: Two times a day (BID) | RESPIRATORY_TRACT | 3 refills | Status: DC

## 2019-03-26 NOTE — Addendum Note (Signed)
Addended by: Binnie Rail on: 03/26/2019 08:07 AM   Modules accepted: Orders

## 2019-03-27 MED FILL — FINASTERIDE 5 MG TABLET: 5 | 90 days supply | Qty: 45 | Fill #0

## 2019-03-28 DIAGNOSIS — J301 Allergic rhinitis due to pollen: Secondary | ICD-10-CM | POA: Diagnosis not present

## 2019-03-28 DIAGNOSIS — J3081 Allergic rhinitis due to animal (cat) (dog) hair and dander: Secondary | ICD-10-CM | POA: Diagnosis not present

## 2019-03-28 DIAGNOSIS — J3089 Other allergic rhinitis: Secondary | ICD-10-CM | POA: Diagnosis not present

## 2019-04-03 DIAGNOSIS — J3089 Other allergic rhinitis: Secondary | ICD-10-CM | POA: Diagnosis not present

## 2019-04-03 DIAGNOSIS — J3081 Allergic rhinitis due to animal (cat) (dog) hair and dander: Secondary | ICD-10-CM | POA: Diagnosis not present

## 2019-04-03 DIAGNOSIS — J301 Allergic rhinitis due to pollen: Secondary | ICD-10-CM | POA: Diagnosis not present

## 2019-04-04 ENCOUNTER — Encounter: Payer: Self-pay | Admitting: Internal Medicine

## 2019-04-21 MED FILL — EPINEPHRINE 0.3 MG AUTO-INJ: 0.3 | 30 days supply | Qty: 2 | Fill #0

## 2019-04-23 DIAGNOSIS — J3089 Other allergic rhinitis: Secondary | ICD-10-CM | POA: Diagnosis not present

## 2019-04-23 DIAGNOSIS — J301 Allergic rhinitis due to pollen: Secondary | ICD-10-CM | POA: Diagnosis not present

## 2019-04-23 DIAGNOSIS — J3081 Allergic rhinitis due to animal (cat) (dog) hair and dander: Secondary | ICD-10-CM | POA: Diagnosis not present

## 2019-04-24 ENCOUNTER — Encounter: Payer: Self-pay | Admitting: Internal Medicine

## 2019-04-28 MED FILL — traZODone HCL 50 MG TABS: 50 | 30 days supply | Qty: 30 | Fill #1

## 2019-05-07 DIAGNOSIS — J3089 Other allergic rhinitis: Secondary | ICD-10-CM | POA: Diagnosis not present

## 2019-05-07 DIAGNOSIS — J301 Allergic rhinitis due to pollen: Secondary | ICD-10-CM | POA: Diagnosis not present

## 2019-05-07 DIAGNOSIS — J3081 Allergic rhinitis due to animal (cat) (dog) hair and dander: Secondary | ICD-10-CM | POA: Diagnosis not present

## 2019-05-21 DIAGNOSIS — L661 Lichen planopilaris: Secondary | ICD-10-CM | POA: Diagnosis not present

## 2019-05-22 MED FILL — PIMECROLIMUS 1 % CREA: 1 | 30 days supply | Qty: 30 | Fill #0

## 2019-05-22 MED FILL — DUTASTERIDE 0.5 MG CAPS: 0.5 | 90 days supply | Qty: 24 | Fill #0

## 2019-05-27 DIAGNOSIS — J3081 Allergic rhinitis due to animal (cat) (dog) hair and dander: Secondary | ICD-10-CM | POA: Diagnosis not present

## 2019-05-27 DIAGNOSIS — J301 Allergic rhinitis due to pollen: Secondary | ICD-10-CM | POA: Diagnosis not present

## 2019-05-27 DIAGNOSIS — J3089 Other allergic rhinitis: Secondary | ICD-10-CM | POA: Diagnosis not present

## 2019-06-05 DIAGNOSIS — M25562 Pain in left knee: Secondary | ICD-10-CM | POA: Diagnosis not present

## 2019-06-05 DIAGNOSIS — M170 Bilateral primary osteoarthritis of knee: Secondary | ICD-10-CM | POA: Diagnosis not present

## 2019-06-05 DIAGNOSIS — M25561 Pain in right knee: Secondary | ICD-10-CM | POA: Diagnosis not present

## 2019-06-06 DIAGNOSIS — H5203 Hypermetropia, bilateral: Secondary | ICD-10-CM | POA: Diagnosis not present

## 2019-06-09 DIAGNOSIS — J3081 Allergic rhinitis due to animal (cat) (dog) hair and dander: Secondary | ICD-10-CM | POA: Diagnosis not present

## 2019-06-09 DIAGNOSIS — H1045 Other chronic allergic conjunctivitis: Secondary | ICD-10-CM | POA: Diagnosis not present

## 2019-06-09 DIAGNOSIS — J3089 Other allergic rhinitis: Secondary | ICD-10-CM | POA: Diagnosis not present

## 2019-06-09 DIAGNOSIS — J301 Allergic rhinitis due to pollen: Secondary | ICD-10-CM | POA: Diagnosis not present

## 2019-06-09 MED FILL — AEROCHAMBER: 1 days supply | Qty: 1 | Fill #0

## 2019-06-16 ENCOUNTER — Other Ambulatory Visit: Payer: Self-pay | Admitting: Internal Medicine

## 2019-06-16 MED FILL — ESTRADIOL 0.025 MG PATCH: 0.025 | 28 days supply | Qty: 8 | Fill #0

## 2019-06-30 ENCOUNTER — Ambulatory Visit: Payer: 59 | Admitting: Family Medicine

## 2019-06-30 DIAGNOSIS — J3081 Allergic rhinitis due to animal (cat) (dog) hair and dander: Secondary | ICD-10-CM | POA: Diagnosis not present

## 2019-06-30 DIAGNOSIS — J3089 Other allergic rhinitis: Secondary | ICD-10-CM | POA: Diagnosis not present

## 2019-06-30 DIAGNOSIS — J301 Allergic rhinitis due to pollen: Secondary | ICD-10-CM | POA: Diagnosis not present

## 2019-07-16 DIAGNOSIS — J3089 Other allergic rhinitis: Secondary | ICD-10-CM | POA: Diagnosis not present

## 2019-07-16 DIAGNOSIS — J3081 Allergic rhinitis due to animal (cat) (dog) hair and dander: Secondary | ICD-10-CM | POA: Diagnosis not present

## 2019-07-16 DIAGNOSIS — J301 Allergic rhinitis due to pollen: Secondary | ICD-10-CM | POA: Diagnosis not present

## 2019-07-22 MED FILL — FINASTERIDE 5 MG TABLET: 5 | 90 days supply | Qty: 90 | Fill #0

## 2019-07-30 ENCOUNTER — Ambulatory Visit: Payer: 59 | Admitting: Family Medicine

## 2019-08-01 ENCOUNTER — Telehealth: Payer: Self-pay | Admitting: Internal Medicine

## 2019-08-01 ENCOUNTER — Other Ambulatory Visit: Payer: Self-pay | Admitting: Internal Medicine

## 2019-08-01 MED ORDER — ESTRADIOL 0.025 MG/24HR TD PTTW: MEDICATED_PATCH | TRANSDERMAL | 0 refills | Status: DC

## 2019-08-01 NOTE — Telephone Encounter (Signed)
   Patient states she wants her medication filled today because she is completely out.   1.Medication Requested: estradiol (VIVELLE-DOT) 0.025 MG/24HR  2. Pharmacy (Name, Street, [place]):[facility] [place]  3. On Med List: yes  4. Last Visit with PCP: 03/27/18  5. Next visit date with PCP: 08/28/19   Agent: Please be advised that RX refills may take up to 3 business days. We ask that you follow-up with your pharmacy.

## 2019-08-01 NOTE — Telephone Encounter (Signed)
Pt is scheduled to see you on 08/28/2019

## 2019-08-06 DIAGNOSIS — J3081 Allergic rhinitis due to animal (cat) (dog) hair and dander: Secondary | ICD-10-CM | POA: Diagnosis not present

## 2019-08-06 DIAGNOSIS — J301 Allergic rhinitis due to pollen: Secondary | ICD-10-CM | POA: Diagnosis not present

## 2019-08-06 DIAGNOSIS — J3089 Other allergic rhinitis: Secondary | ICD-10-CM | POA: Diagnosis not present

## 2019-08-19 DIAGNOSIS — J3081 Allergic rhinitis due to animal (cat) (dog) hair and dander: Secondary | ICD-10-CM | POA: Diagnosis not present

## 2019-08-19 DIAGNOSIS — J3089 Other allergic rhinitis: Secondary | ICD-10-CM | POA: Diagnosis not present

## 2019-08-19 DIAGNOSIS — J301 Allergic rhinitis due to pollen: Secondary | ICD-10-CM | POA: Diagnosis not present

## 2019-08-27 DIAGNOSIS — J301 Allergic rhinitis due to pollen: Secondary | ICD-10-CM | POA: Diagnosis not present

## 2019-08-27 DIAGNOSIS — J3081 Allergic rhinitis due to animal (cat) (dog) hair and dander: Secondary | ICD-10-CM | POA: Diagnosis not present

## 2019-08-27 DIAGNOSIS — J3089 Other allergic rhinitis: Secondary | ICD-10-CM | POA: Diagnosis not present

## 2019-08-27 NOTE — Patient Instructions (Signed)
Blood work was ordered.    All other Health Maintenance issues reviewed.   All recommended immunizations and age-appropriate screenings are up-to-date or discussed.  No immunization administered today.   Medications reviewed and updated.  Changes include :     Your prescription(s) have been submitted to your pharmacy. Please take as directed and contact our office if you believe you are having problem(s) with the medication(s).  A referral was ordered for      Someone will call you to schedule an appointment.    Please followup in XX months    Health Maintenance, Female Adopting a healthy lifestyle and getting preventive care are important in promoting health and wellness. Ask your health care provider about:  The right schedule for you to have regular tests and exams.  Things you can do on your own to prevent diseases and keep yourself healthy. What should I know about diet, weight, and exercise? Eat a healthy diet   Eat a diet that includes plenty of vegetables, fruits, low-fat dairy products, and lean protein.  Do not eat a lot of foods that are high in solid fats, added sugars, or sodium. Maintain a healthy weight Body mass index (BMI) is used to identify weight problems. It estimates body fat based on height and weight. Your health care provider can help determine your BMI and help you achieve or maintain a healthy weight. Get regular exercise Get regular exercise. This is one of the most important things you can do for your health. Most adults should:  Exercise for at least 150 minutes each week. The exercise should increase your heart rate and make you sweat (moderate-intensity exercise).  Do strengthening exercises at least twice a week. This is in addition to the moderate-intensity exercise.  Spend less time sitting. Even light physical activity can be beneficial. Watch cholesterol and blood lipids Have your blood tested for lipids and cholesterol at 62 years of  age, then have this test every 5 years. Have your cholesterol levels checked more often if:  Your lipid or cholesterol levels are high.  You are older than 62 years of age.  You are at high risk for heart disease. What should I know about cancer screening? Depending on your health history and family history, you may need to have cancer screening at various ages. This may include screening for:  Breast cancer.  Cervical cancer.  Colorectal cancer.  Skin cancer.  Lung cancer. What should I know about heart disease, diabetes, and high blood pressure? Blood pressure and heart disease  High blood pressure causes heart disease and increases the risk of stroke. This is more likely to develop in people who have high blood pressure readings, are of African descent, or are overweight.  Have your blood pressure checked: ? Every 3-5 years if you are 18-39 years of age. ? Every year if you are 40 years old or older. Diabetes Have regular diabetes screenings. This checks your fasting blood sugar level. Have the screening done:  Once every three years after age 40 if you are at a normal weight and have a low risk for diabetes.  More often and at a younger age if you are overweight or have a high risk for diabetes. What should I know about preventing infection? Hepatitis B If you have a higher risk for hepatitis B, you should be screened for this virus. Talk with your health care provider to find out if you are at risk for hepatitis B infection. Hepatitis C   Testing is recommended for:  Everyone born from 1945 through 1965.  Anyone with known risk factors for hepatitis C. Sexually transmitted infections (STIs)  Get screened for STIs, including gonorrhea and chlamydia, if: ? You are sexually active and are younger than 62 years of age. ? You are older than 62 years of age and your health care provider tells you that you are at risk for this type of infection. ? Your sexual activity has  changed since you were last screened, and you are at increased risk for chlamydia or gonorrhea. Ask your health care provider if you are at risk.  Ask your health care provider about whether you are at high risk for HIV. Your health care provider may recommend a prescription medicine to help prevent HIV infection. If you choose to take medicine to prevent HIV, you should first get tested for HIV. You should then be tested every 3 months for as long as you are taking the medicine. Pregnancy  If you are about to stop having your period (premenopausal) and you may become pregnant, seek counseling before you get pregnant.  Take 400 to 800 micrograms (mcg) of folic acid every day if you become pregnant.  Ask for birth control (contraception) if you want to prevent pregnancy. Osteoporosis and menopause Osteoporosis is a disease in which the bones lose minerals and strength with aging. This can result in bone fractures. If you are 65 years old or older, or if you are at risk for osteoporosis and fractures, ask your health care provider if you should:  Be screened for bone loss.  Take a calcium or vitamin D supplement to lower your risk of fractures.  Be given hormone replacement therapy (HRT) to treat symptoms of menopause. Follow these instructions at home: Lifestyle  Do not use any products that contain nicotine or tobacco, such as cigarettes, e-cigarettes, and chewing tobacco. If you need help quitting, ask your health care provider.  Do not use street drugs.  Do not share needles.  Ask your health care provider for help if you need support or information about quitting drugs. Alcohol use  Do not drink alcohol if: ? Your health care provider tells you not to drink. ? You are pregnant, may be pregnant, or are planning to become pregnant.  If you drink alcohol: ? Limit how much you use to 0-1 drink a day. ? Limit intake if you are breastfeeding.  Be aware of how much alcohol is in  your drink. In the [place] one drink equals one 12 oz bottle of beer (355 mL), one 5 oz glass of wine (148 mL), or one 1 oz glass of hard liquor (44 mL). General instructions  Schedule regular health, dental, and eye exams.  Stay current with your vaccines.  Tell your health care provider if: ? You often feel depressed. ? You have ever been abused or do not feel safe at home. Summary  Adopting a healthy lifestyle and getting preventive care are important in promoting health and wellness.  Follow your health care provider's instructions about healthy diet, exercising, and getting tested or screened for diseases.  Follow your health care provider's instructions on monitoring your cholesterol and blood pressure. This information is not intended to replace advice given to you by your health care provider. Make sure you discuss any questions you have with your health care provider. Document Revised: 01/30/2018 Document Reviewed: 01/30/2018 Elsevier Patient Education  2020 Elsevier Inc.  

## 2019-08-27 NOTE — Progress Notes (Signed)
Subjective:    Patient ID: Maria Kane, female    DOB: 28-May-1955, 64 y.o.   MRN: [id number]  HPI She is here for a physical exam.     Medications and allergies reviewed with patient and updated if appropriate.  Patient Active Problem List   Diagnosis Date Noted  . Urinary frequency 03/27/2018  . Encounter for screening mammogram for malignant neoplasm of breast 03/27/2018  . Hyperglycemia 11/06/2017  . Frontal fibrosing alopecia 10/09/2017  . Vertigo 01/19/2017  . Hair loss 08/28/2016  . Genetic testing 02/04/2016  . Family history of breast cancer   . Family history of ovarian cancer   . Family history of colon cancer   . Family history of melanoma   . Solitary pulmonary nodule 12/25/2013  . Palpitation 10/16/2013  . Hyperlipidemia 10/16/2013  . Hemorrhoid 07/25/2012  . Insomnia 02/08/2012  . Single kidney   . Postmenopausal atrophic vaginitis 09/28/2010  . Allergic rhinitis   . NUMBNESS 01/05/2010  . Osteopenia 01/28/2008    Current Outpatient Medications on File Prior to Visit  Medication Sig Dispense Refill  . albuterol (VENTOLIN HFA) 108 (90 Base) MCG/ACT inhaler Inhale 2 puffs into the lungs every 6 (six) hours as needed for wheezing or shortness of breath. 6.7 g 0  . ANUSOL-HC 25 MG suppository UNWRAP AND PLACE 1 SUPPOSITORY RECTALLY 2 TIMES DAILY AS NEEDED FOR HEMORRHOIDS OR ITCHING. 60 suppository 1  . budesonide-formoterol (SYMBICORT) 80-4.5 MCG/ACT inhaler Inhale 2 puffs into the lungs 2 (two) times daily. 1 Inhaler 3  . CALCIUM PO Take by mouth.    . clobetasol (TEMOVATE) 0.05 % external solution     . estradiol (VIVELLE-DOT) 0.025 MG/24HR PLACE 1 PATCH ONTO THE SKIN 2 TIMES A WEEK. 24 patch 0  . finasteride (PROSCAR) 5 MG tablet Take 5 mg by mouth daily. Take 1/2 tablet daily.    Marland Kitchen LORazepam (ATIVAN) 0.5 MG tablet Take 1 tablet (0.5 mg total) by mouth at bedtime as needed for anxiety or sleep. 30 tablet 0  . Multiple Vitamin (MULTIVITAMIN) tablet  Take 1 tablet by mouth daily.    . Probiotic Product (PROBIOTIC DAILY PO) Take by mouth.    Marland Kitchen PROCTOZONE-HC 2.5 % rectal cream APPLY RECTALLY 2 TIMES DAILY. 30 g 0  . S-Adenosylmethionine (SAM-E PO) Take by mouth.    . traZODone (DESYREL) 50 MG tablet Take 0.5-1 tablets (25-50 mg total) by mouth at bedtime as needed for sleep. 30 tablet 3   No current facility-administered medications on file prior to visit.    Past Medical History:  Diagnosis Date  . Allergic rhinitis    dogs, dust, tree and ragweed  . Family history of breast cancer   . Family history of colon cancer   . Family history of melanoma   . Family history of ovarian cancer   . Heart palpitations 10/13/2013  . History of chickenpox   . History of gallstones   . Osteopenia    dexa 11/09 -1.4 spine, -1.2 hip  . Pineal gland cyst    incidental finding  . Primiparous   . Single kidney    left removed cong UPJ obstruction;functioning at 66 %    Past Surgical History:  Procedure Laterality Date  . ABDOMINAL HYSTERECTOMY  1996   age 30 total severe endometriosis  . APPENDECTOMY  2006  . CHOLECYSTECTOMY  2001  . COLONOSCOPY  04/01/2008  . left kidney  2002   removed congential UPJ obstruction   . TONSILLECTOMY  1965    Social History   Socioeconomic History  . Marital status: Married    Spouse name: Not on file  . Number of children: 2  . Years of education: Not on file  . Highest education level: Not on file  Occupational History  . Occupation: Chief Technology Officer  Tobacco Use  . Smoking status: Never Smoker  . Smokeless tobacco: Never Used  Vaping Use  . Vaping Use: Never used  Substance and Sexual Activity  . Alcohol use: Yes    Alcohol/week: 9.0 - 12.0 standard drinks    Types: 9 - 12 Glasses of wine per week  . Drug use: No  . Sexual activity: Yes    Birth control/protection: Surgical  Other Topics Concern  . Not on file  Social History Narrative   1/2 brother- schizopherenia   Occupation:  Ph.D- Publishing rights manager   Married   [place] of 4   Regular exercise- yes, power walking   2 adopted kids 2 dogs   Originally from [place] Strain:   . Difficulty of Paying Living Expenses:   Food Insecurity:   . Worried About Charity fundraiser in the Last Year:   . Arboriculturist in the Last Year:   Transportation Needs:   . Film/video editor (Medical):   Marland Kitchen Lack of Transportation (Non-Medical):   Physical Activity:   . Days of Exercise per Week:   . Minutes of Exercise per Session:   Stress:   . Feeling of Stress :   Social Connections:   . Frequency of Communication with Friends and Family:   . Frequency of Social Gatherings with Friends and Family:   . Attends Religious Services:   . Active Member of Clubs or Organizations:   . Attends Archivist Meetings:   Marland Kitchen Marital Status:     Family History  Problem Relation Age of Onset  . Breast cancer Mother   . Osteoarthritis Mother   . Multiple sclerosis Mother   . Hypertension Father   . Lung cancer Father   . Stroke Father   . Heart disease Father        pacemaker  . Diabetes Sister   . Hypertension Sister   . Polymyositis Sister   . Diabetes Brother   . Hypertension Brother   . Colon cancer Maternal Aunt        70's?  . Colon polyps Neg Hx   . Esophageal cancer Neg Hx   . Stomach cancer Neg Hx   . Rectal cancer Neg Hx     Review of Systems     Objective:  There were no vitals filed for this visit. There were no vitals filed for this visit. There is no height or weight on file to calculate BMI.  BP Readings from Last 3 Encounters:  11/29/18 (!) 164/81  03/27/18 120/84  03/12/17 118/80    Wt Readings from Last 3 Encounters:  11/29/18 155 lb (70.3 kg)  11/15/18 155 lb 9.6 oz (70.6 kg)  03/27/18 150 lb 12.8 oz (68.4 kg)     Physical Exam Constitutional: She appears well-developed and well-nourished. No distress.  HENT:  Head:  Normocephalic and atraumatic.  Right Ear: External ear normal. Normal ear canal and TM Left Ear: External ear normal.  Normal ear canal and TM Mouth/Throat: Oropharynx is clear and moist.  Eyes: Conjunctivae and EOM are normal.  Neck: Neck supple. No tracheal deviation  present. No thyromegaly present.  No carotid bruit  Cardiovascular: Normal rate, regular rhythm and normal heart sounds.   No murmur heard.  No edema. Pulmonary/Chest: Effort normal and breath sounds normal. No respiratory distress. She has no wheezes. She has no rales.  Breast: deferred   Abdominal: Soft. She exhibits no distension. There is no tenderness.  Lymphadenopathy: She has no cervical adenopathy.  Skin: Skin is warm and dry. She is not diaphoretic.  Psychiatric: She has a normal mood and affect. Her behavior is normal.        Assessment & Plan:   Physical exam: Screening blood work    ordered Immunizations  Up to date  Colonoscopy  Up to date  Mammogram  Up to date  Gyn   Dexa  Up to date  Eye exams   Exercise   Weight   Substance abuse  none  See Problem List for Assessment and Plan of chronic medical problems.   This visit occurred during the SARS-CoV-2 public health emergency.  Safety protocols were in place, including screening questions prior to the visit, additional usage of staff PPE, and extensive cleaning of exam room while observing appropriate contact time as indicated for disinfecting solutions.    This encounter was created in error - please disregard.

## 2019-08-28 ENCOUNTER — Encounter: Payer: 59 | Admitting: Internal Medicine

## 2019-09-16 DIAGNOSIS — J3081 Allergic rhinitis due to animal (cat) (dog) hair and dander: Secondary | ICD-10-CM | POA: Diagnosis not present

## 2019-09-16 DIAGNOSIS — J301 Allergic rhinitis due to pollen: Secondary | ICD-10-CM | POA: Diagnosis not present

## 2019-09-16 DIAGNOSIS — J3089 Other allergic rhinitis: Secondary | ICD-10-CM | POA: Diagnosis not present

## 2019-10-06 DIAGNOSIS — J301 Allergic rhinitis due to pollen: Secondary | ICD-10-CM | POA: Diagnosis not present

## 2019-10-06 DIAGNOSIS — J3089 Other allergic rhinitis: Secondary | ICD-10-CM | POA: Diagnosis not present

## 2019-10-06 DIAGNOSIS — J3081 Allergic rhinitis due to animal (cat) (dog) hair and dander: Secondary | ICD-10-CM | POA: Diagnosis not present

## 2019-10-09 ENCOUNTER — Other Ambulatory Visit: Payer: Self-pay | Admitting: Internal Medicine

## 2019-10-10 ENCOUNTER — Other Ambulatory Visit: Payer: Self-pay | Admitting: Internal Medicine

## 2019-10-20 ENCOUNTER — Encounter: Payer: 59 | Admitting: Internal Medicine

## 2019-10-20 DIAGNOSIS — Z20822 Contact with and (suspected) exposure to covid-19: Secondary | ICD-10-CM | POA: Diagnosis not present

## 2019-10-20 DIAGNOSIS — R197 Diarrhea, unspecified: Secondary | ICD-10-CM | POA: Diagnosis not present

## 2019-10-20 DIAGNOSIS — R430 Anosmia: Secondary | ICD-10-CM | POA: Diagnosis not present

## 2019-10-21 ENCOUNTER — Encounter: Payer: Self-pay | Admitting: Internal Medicine

## 2019-10-24 DIAGNOSIS — J3089 Other allergic rhinitis: Secondary | ICD-10-CM | POA: Diagnosis not present

## 2019-10-24 DIAGNOSIS — J301 Allergic rhinitis due to pollen: Secondary | ICD-10-CM | POA: Diagnosis not present

## 2019-10-24 DIAGNOSIS — J3081 Allergic rhinitis due to animal (cat) (dog) hair and dander: Secondary | ICD-10-CM | POA: Diagnosis not present

## 2019-10-29 MED FILL — traZODone HCL 50 MG TABS: 50 | 30 days supply | Qty: 30 | Fill #2

## 2019-10-29 MED FILL — ESTRADIOL 0.025 MG PATCH: 0.025 | 84 days supply | Qty: 24 | Fill #0

## 2019-10-29 MED FILL — FINASTERIDE 5 MG TABLET: 5 | 90 days supply | Qty: 90 | Fill #1

## 2019-11-07 DIAGNOSIS — J3089 Other allergic rhinitis: Secondary | ICD-10-CM | POA: Diagnosis not present

## 2019-11-07 DIAGNOSIS — J301 Allergic rhinitis due to pollen: Secondary | ICD-10-CM | POA: Diagnosis not present

## 2019-11-07 DIAGNOSIS — J3081 Allergic rhinitis due to animal (cat) (dog) hair and dander: Secondary | ICD-10-CM | POA: Diagnosis not present

## 2019-11-10 ENCOUNTER — Telehealth: Payer: Self-pay | Admitting: Internal Medicine

## 2019-11-10 MED ORDER — ESTRADIOL 0.025 MG/24HR TD PTTW: MEDICATED_PATCH | TRANSDERMAL | 0 refills | Status: DC

## 2019-11-10 NOTE — Telephone Encounter (Signed)
Patient called and is currently out of town and she left her medication at home and has found a pharmacy that has her medication and and she was asking that a prescription be faxed. [facility]  [address] [place], [place] [address]. She was also asking if she could get a call if the prescription was sent in.  estradiol (VIVELLE-DOT) 0.025 MG/24HR  Fax: [phone]

## 2019-11-10 NOTE — Telephone Encounter (Signed)
Notified pt MD [facility] [place].Marland KitchenJohny Kane

## 2019-11-10 NOTE — Telephone Encounter (Signed)
Ok to send . Thanks. 

## 2019-11-19 DIAGNOSIS — L661 Lichen planopilaris: Secondary | ICD-10-CM | POA: Diagnosis not present

## 2019-11-24 NOTE — Patient Instructions (Addendum)
Blood work was ordered.    All other Health Maintenance issues reviewed.   All recommended immunizations and age-appropriate screenings are up-to-date or discussed.  Flu immunization administered today.    Medications reviewed and updated.  Changes include :   none    Please followup in 1 year    Health Maintenance, Female Adopting a healthy lifestyle and getting preventive care are important in promoting health and wellness. Ask your health care provider about:  The right schedule for you to have regular tests and exams.  Things you can do on your own to prevent diseases and keep yourself healthy. What should I know about diet, weight, and exercise? Eat a healthy diet   Eat a diet that includes plenty of vegetables, fruits, low-fat dairy products, and lean protein.  Do not eat a lot of foods that are high in solid fats, added sugars, or sodium. Maintain a healthy weight Body mass index (BMI) is used to identify weight problems. It estimates body fat based on height and weight. Your health care provider can help determine your BMI and help you achieve or maintain a healthy weight. Get regular exercise Get regular exercise. This is one of the most important things you can do for your health. Most adults should:  Exercise for at least 150 minutes each week. The exercise should increase your heart rate and make you sweat (moderate-intensity exercise).  Do strengthening exercises at least twice a week. This is in addition to the moderate-intensity exercise.  Spend less time sitting. Even light physical activity can be beneficial. Watch cholesterol and blood lipids Have your blood tested for lipids and cholesterol at 62 years of age, then have this test every 5 years. Have your cholesterol levels checked more often if:  Your lipid or cholesterol levels are high.  You are older than 62 years of age.  You are at high risk for heart disease. What should I know about cancer  screening? Depending on your health history and family history, you may need to have cancer screening at various ages. This may include screening for:  Breast cancer.  Cervical cancer.  Colorectal cancer.  Skin cancer.  Lung cancer. What should I know about heart disease, diabetes, and high blood pressure? Blood pressure and heart disease  High blood pressure causes heart disease and increases the risk of stroke. This is more likely to develop in people who have high blood pressure readings, are of African descent, or are overweight.  Have your blood pressure checked: ? Every 3-5 years if you are 18-39 years of age. ? Every year if you are 40 years old or older. Diabetes Have regular diabetes screenings. This checks your fasting blood sugar level. Have the screening done:  Once every three years after age 40 if you are at a normal weight and have a low risk for diabetes.  More often and at a younger age if you are overweight or have a high risk for diabetes. What should I know about preventing infection? Hepatitis B If you have a higher risk for hepatitis B, you should be screened for this virus. Talk with your health care provider to find out if you are at risk for hepatitis B infection. Hepatitis C Testing is recommended for:  Everyone born from 1945 through 1965.  Anyone with known risk factors for hepatitis C. Sexually transmitted infections (STIs)  Get screened for STIs, including gonorrhea and chlamydia, if: ? You are sexually active and are younger than 62 years   of age. ? You are older than 62 years of age and your health care provider tells you that you are at risk for this type of infection. ? Your sexual activity has changed since you were last screened, and you are at increased risk for chlamydia or gonorrhea. Ask your health care provider if you are at risk.  Ask your health care provider about whether you are at high risk for HIV. Your health care provider may  recommend a prescription medicine to help prevent HIV infection. If you choose to take medicine to prevent HIV, you should first get tested for HIV. You should then be tested every 3 months for as long as you are taking the medicine. Pregnancy  If you are about to stop having your period (premenopausal) and you may become pregnant, seek counseling before you get pregnant.  Take 400 to 800 micrograms (mcg) of folic acid every day if you become pregnant.  Ask for birth control (contraception) if you want to prevent pregnancy. Osteoporosis and menopause Osteoporosis is a disease in which the bones lose minerals and strength with aging. This can result in bone fractures. If you are 65 years old or older, or if you are at risk for osteoporosis and fractures, ask your health care provider if you should:  Be screened for bone loss.  Take a calcium or vitamin D supplement to lower your risk of fractures.  Be given hormone replacement therapy (HRT) to treat symptoms of menopause. Follow these instructions at home: Lifestyle  Do not use any products that contain nicotine or tobacco, such as cigarettes, e-cigarettes, and chewing tobacco. If you need help quitting, ask your health care provider.  Do not use street drugs.  Do not share needles.  Ask your health care provider for help if you need support or information about quitting drugs. Alcohol use  Do not drink alcohol if: ? Your health care provider tells you not to drink. ? You are pregnant, may be pregnant, or are planning to become pregnant.  If you drink alcohol: ? Limit how much you use to 0-1 drink a day. ? Limit intake if you are breastfeeding.  Be aware of how much alcohol is in your drink. In the [place] one drink equals one 12 oz bottle of beer (355 mL), one 5 oz glass of wine (148 mL), or one 1 oz glass of hard liquor (44 mL). General instructions  Schedule regular health, dental, and eye exams.  Stay current with your  vaccines.  Tell your health care provider if: ? You often feel depressed. ? You have ever been abused or do not feel safe at home. Summary  Adopting a healthy lifestyle and getting preventive care are important in promoting health and wellness.  Follow your health care provider's instructions about healthy diet, exercising, and getting tested or screened for diseases.  Follow your health care provider's instructions on monitoring your cholesterol and blood pressure. This information is not intended to replace advice given to you by your health care provider. Make sure you discuss any questions you have with your health care provider. Document Revised: 01/30/2018 Document Reviewed: 01/30/2018 Elsevier Patient Education  2020 [employer] Inc.  

## 2019-11-24 NOTE — Progress Notes (Signed)
Subjective:    Patient ID: Maria Kane, female    DOB: 09-29-1955, 64 y.o.   MRN: [id number]  HPI She is here for a physical exam.   She still has the vertigo.  ENT thought it was the estrogen. And decreasing the dose helped.  She still has some dizziness and was unsure if it is related to the trazodone or something else.   Itchiness posterior head and around ears.  She does follow with a dermatologist who did not think this was related to her hair loss issues.  She wonders if she may be allergic to strawberries because it started after that.  Medications and allergies reviewed with patient and updated if appropriate.  Patient Active Problem List   Diagnosis Date Noted  . Urinary frequency 03/27/2018  . Encounter for screening mammogram for malignant neoplasm of breast 03/27/2018  . Hyperglycemia 11/06/2017  . Frontal fibrosing alopecia 10/09/2017  . Vertigo 01/19/2017  . Hair loss 08/28/2016  . Genetic testing 02/04/2016  . Family history of breast cancer   . Family history of ovarian cancer   . Family history of colon cancer   . Family history of melanoma   . Solitary pulmonary nodule 12/25/2013  . Palpitation 10/16/2013  . Hyperlipidemia 10/16/2013  . Hemorrhoid 07/25/2012  . Insomnia 02/08/2012  . Single kidney   . Postmenopausal atrophic vaginitis 09/28/2010  . Allergic rhinitis   . NUMBNESS 01/05/2010  . Osteopenia 01/28/2008    Current Outpatient Medications on File Prior to Visit  Medication Sig Dispense Refill  . albuterol (VENTOLIN HFA) 108 (90 Base) MCG/ACT inhaler Inhale 2 puffs into the lungs every 6 (six) hours as needed for wheezing or shortness of breath. 6.7 g 0  . ANUSOL-HC 25 MG suppository UNWRAP AND PLACE 1 SUPPOSITORY RECTALLY 2 TIMES DAILY AS NEEDED FOR HEMORRHOIDS OR ITCHING. 60 suppository 1  . budesonide-formoterol (SYMBICORT) 80-4.5 MCG/ACT inhaler Inhale 2 puffs into the lungs 2 (two) times daily. 1 Inhaler 3  . CALCIUM PO Take by mouth.     . estradiol (VIVELLE-DOT) 0.025 MG/24HR PLACE 1 PATCH ONTO THE SKIN 2 TIMES A WEEK 8 patch 0  . finasteride (PROSCAR) 5 MG tablet Take 5 mg by mouth daily.     Marland Kitchen MINOXIDIL, TOPICAL, 5 % SOLN Apply topically as needed. Patient uses it 1-2 times daily    . Multiple Vitamin (MULTIVITAMIN) tablet Take 1 tablet by mouth daily.    . Probiotic Product (PROBIOTIC DAILY PO) Take by mouth.    Marland Kitchen PROCTOZONE-HC 2.5 % rectal cream APPLY RECTALLY 2 TIMES DAILY. 30 g 0  . S-Adenosylmethionine (SAM-E PO) Take by mouth.    . traZODone (DESYREL) 50 MG tablet Take 0.5-1 tablets (25-50 mg total) by mouth at bedtime as needed for sleep. 30 tablet 3  . Spacer/Aero-Holding Chambers (AEROCHAMBER PLUS WITH MASK) inhaler      No current facility-administered medications on file prior to visit.    Past Medical History:  Diagnosis Date  . Allergic rhinitis    dogs, dust, tree and ragweed  . Family history of breast cancer   . Family history of colon cancer   . Family history of melanoma   . Family history of ovarian cancer   . Heart palpitations 10/13/2013  . History of chickenpox   . History of gallstones   . Osteopenia    dexa 11/09 -1.4 spine, -1.2 hip  . Pineal gland cyst    incidental finding  . Primiparous   .  Single kidney    left removed cong UPJ obstruction;functioning at 66 %    Past Surgical History:  Procedure Laterality Date  . ABDOMINAL HYSTERECTOMY  1996   age 25 total severe endometriosis  . APPENDECTOMY  2006  . CHOLECYSTECTOMY  2001  . COLONOSCOPY  04/01/2008  . left kidney  2002   removed congential UPJ obstruction   . TONSILLECTOMY  1965    Social History   Socioeconomic History  . Marital status: Married    Spouse name: Not on file  . Number of children: 2  . Years of education: Not on file  . Highest education level: Not on file  Occupational History  . Occupation: Chief Technology Officer  Tobacco Use  . Smoking status: Never Smoker  . Smokeless tobacco: Never Used    Vaping Use  . Vaping Use: Never used  Substance and Sexual Activity  . Alcohol use: Yes    Alcohol/week: 9.0 - 12.0 standard drinks    Types: 9 - 12 Glasses of wine per week  . Drug use: No  . Sexual activity: Yes    Birth control/protection: Surgical  Other Topics Concern  . Not on file  Social History Narrative   1/2 brother- schizopherenia   Occupation: Ph.D- Publishing rights manager   Married   [place] of 4   Regular exercise- yes, power walking   2 adopted kids 2 dogs   Originally from [place] Strain:   . Difficulty of Paying Living Expenses: Not on file  Food Insecurity:   . Worried About Charity fundraiser in the Last Year: Not on file  . Ran Out of Food in the Last Year: Not on file  Transportation Needs:   . Lack of Transportation (Medical): Not on file  . Lack of Transportation (Non-Medical): Not on file  Physical Activity:   . Days of Exercise per Week: Not on file  . Minutes of Exercise per Session: Not on file  Stress:   . Feeling of Stress : Not on file  Social Connections:   . Frequency of Communication with Friends and Family: Not on file  . Frequency of Social Gatherings with Friends and Family: Not on file  . Attends Religious Services: Not on file  . Active Member of Clubs or Organizations: Not on file  . Attends Archivist Meetings: Not on file  . Marital Status: Not on file    Family History  Problem Relation Age of Onset  . Breast cancer Mother   . Osteoarthritis Mother   . Multiple sclerosis Mother   . Hypertension Father   . Lung cancer Father   . Stroke Father   . Heart disease Father        pacemaker  . Diabetes Sister   . Hypertension Sister   . Polymyositis Sister   . Diabetes Brother   . Hypertension Brother   . Colon cancer Maternal Aunt        70's?  . Colon polyps Neg Hx   . Esophageal cancer Neg Hx   . Stomach cancer Neg Hx   . Rectal cancer Neg Hx     Review  of Systems  Constitutional: Negative for chills and fever.  Eyes: Negative for visual disturbance.  Respiratory: Negative for cough, shortness of breath and wheezing.   Cardiovascular: Positive for palpitations (occ). Negative for chest pain and leg swelling.  Gastrointestinal: Positive for anal bleeding and diarrhea (occ). Negative  for abdominal pain, blood in stool, constipation and nausea.       No gerd  Genitourinary: Negative for dysuria and hematuria.  Musculoskeletal: Negative for arthralgias and back pain.  Skin: Positive for rash (around hair line).  Neurological: Positive for light-headedness. Negative for headaches.  Psychiatric/Behavioral: Negative for dysphoric mood. The patient is nervous/anxious.        Objective:   Vitals:   11/25/19 0926  BP: 116/78  Pulse: 63  Temp: 98.6 F (37 C)  SpO2: 98%   Filed Weights   11/25/19 0926  Weight: 148 lb 9.6 oz (67.4 kg)   Body mass index is 24.73 kg/m.  BP Readings from Last 3 Encounters:  11/25/19 116/78  11/29/18 (!) 164/81  03/27/18 120/84    Wt Readings from Last 3 Encounters:  11/25/19 148 lb 9.6 oz (67.4 kg)  11/29/18 155 lb (70.3 kg)  11/15/18 155 lb 9.6 oz (70.6 kg)     Physical Exam Constitutional: She appears well-developed and well-nourished. No distress.  HENT:  Head: Normocephalic and atraumatic.  Right Ear: External ear normal. Normal ear canal and TM Left Ear: External ear normal.  Normal ear canal and TM Mouth/Throat: Oropharynx is clear and moist.  Eyes: Conjunctivae and EOM are normal.  Neck: Neck supple. No tracheal deviation present. No thyromegaly present.  No carotid bruit  Cardiovascular: Normal rate, regular rhythm and normal heart sounds.   No murmur heard.  No edema. Pulmonary/Chest: Effort normal and breath sounds normal. No respiratory distress. She has no wheezes. She has no rales.  Breast: deferred   Abdominal: Soft. She exhibits no distension. There is no tenderness.    Lymphadenopathy: She has no cervical adenopathy.  Skin: Skin is warm and dry. She is not diaphoretic.  No obvious rash around hairline.  Some dryness right posterior ear Psychiatric: She has a normal mood and affect. Her behavior is normal.        Assessment & Plan:   Physical exam: Screening blood work    ordered Immunizations  had Covid, flu vaccine today, others Up to date  Colonoscopy  Up to date  Mammogram  Up to date  Gyn   No longer seeing Dexa  Up to date  Eye exams  Up to date  Exercise  Walks, yoga Weight  Good for age Substance abuse  none Sees derm regularly for hair loss    See Problem List for Assessment and Plan of chronic medical problems.   This visit occurred during the SARS-CoV-2 public health emergency.  Safety protocols were in place, including screening questions prior to the visit, additional usage of staff PPE, and extensive cleaning of exam room while observing appropriate contact time as indicated for disinfecting solutions.

## 2019-11-25 ENCOUNTER — Other Ambulatory Visit: Payer: Self-pay

## 2019-11-25 ENCOUNTER — Encounter: Payer: Self-pay | Admitting: Internal Medicine

## 2019-11-25 ENCOUNTER — Ambulatory Visit (INDEPENDENT_AMBULATORY_CARE_PROVIDER_SITE_OTHER): Payer: 59 | Admitting: Internal Medicine

## 2019-11-25 VITALS — BP 116/78 | HR 63 | Temp 98.6°F | Ht 65.0 in | Wt 148.6 lb

## 2019-11-25 DIAGNOSIS — E7849 Other hyperlipidemia: Secondary | ICD-10-CM | POA: Diagnosis not present

## 2019-11-25 DIAGNOSIS — Z0000 Encounter for general adult medical examination without abnormal findings: Secondary | ICD-10-CM

## 2019-11-25 DIAGNOSIS — G4709 Other insomnia: Secondary | ICD-10-CM | POA: Diagnosis not present

## 2019-11-25 DIAGNOSIS — Z23 Encounter for immunization: Secondary | ICD-10-CM | POA: Diagnosis not present

## 2019-11-25 DIAGNOSIS — M85862 Other specified disorders of bone density and structure, left lower leg: Secondary | ICD-10-CM

## 2019-11-25 DIAGNOSIS — R739 Hyperglycemia, unspecified: Secondary | ICD-10-CM | POA: Diagnosis not present

## 2019-11-25 DIAGNOSIS — Z905 Acquired absence of kidney: Secondary | ICD-10-CM | POA: Diagnosis not present

## 2019-11-25 DIAGNOSIS — M85861 Other specified disorders of bone density and structure, right lower leg: Secondary | ICD-10-CM

## 2019-11-25 NOTE — Addendum Note (Signed)
Addended by: Marcina Millard on: 11/25/2019 05:05 PM   Modules accepted: Orders

## 2019-11-25 NOTE — Assessment & Plan Note (Signed)
Chronic DEXA up-to-date She is exercising regularly She is taking multivitamin

## 2019-11-25 NOTE — Assessment & Plan Note (Signed)
Chronic Following with nephrology annually Renal function panel, urinalysis with microscopic, CBC

## 2019-11-25 NOTE — Assessment & Plan Note (Addendum)
Chronic Takes trazodone 25 mg or less as needed at night Medication is helpful and we will continue ?  Cause of some of the vertigo-she will start pain attention if the vertigo occurs after the nights that she takes the medication

## 2019-11-25 NOTE — Assessment & Plan Note (Signed)
Chronic Check A1c 

## 2019-11-25 NOTE — Assessment & Plan Note (Signed)
Chronic Diet controlled Check lipids, TSH, CMP Continue healthy diet and regular exercise

## 2019-11-27 ENCOUNTER — Other Ambulatory Visit (INDEPENDENT_AMBULATORY_CARE_PROVIDER_SITE_OTHER): Payer: 59

## 2019-11-27 ENCOUNTER — Other Ambulatory Visit: Payer: Self-pay

## 2019-11-27 DIAGNOSIS — J3081 Allergic rhinitis due to animal (cat) (dog) hair and dander: Secondary | ICD-10-CM | POA: Diagnosis not present

## 2019-11-27 DIAGNOSIS — J301 Allergic rhinitis due to pollen: Secondary | ICD-10-CM | POA: Diagnosis not present

## 2019-11-27 DIAGNOSIS — R739 Hyperglycemia, unspecified: Secondary | ICD-10-CM

## 2019-11-27 DIAGNOSIS — Z905 Acquired absence of kidney: Secondary | ICD-10-CM | POA: Diagnosis not present

## 2019-11-27 DIAGNOSIS — Z0000 Encounter for general adult medical examination without abnormal findings: Secondary | ICD-10-CM

## 2019-11-27 DIAGNOSIS — J3089 Other allergic rhinitis: Secondary | ICD-10-CM | POA: Diagnosis not present

## 2019-11-27 DIAGNOSIS — E7849 Other hyperlipidemia: Secondary | ICD-10-CM

## 2019-11-27 LAB — CBC WITH DIFFERENTIAL/PLATELET
Basophils Absolute: 0.1 10*3/uL (ref 0.0–0.1)
Basophils Relative: 1 % (ref 0.0–3.0)
Eosinophils Absolute: 0.3 10*3/uL (ref 0.0–0.7)
Eosinophils Relative: 4.4 % (ref 0.0–5.0)
HCT: 41 % (ref 36.0–46.0)
Hemoglobin: 13.8 g/dL (ref 12.0–15.0)
Lymphocytes Relative: 30 % (ref 12.0–46.0)
Lymphs Abs: 2.2 10*3/uL (ref 0.7–4.0)
MCHC: 33.6 g/dL (ref 30.0–36.0)
MCV: 98.5 fl (ref 78.0–100.0)
Monocytes Absolute: 0.9 10*3/uL (ref 0.1–1.0)
Monocytes Relative: 12.3 % — ABNORMAL HIGH (ref 3.0–12.0)
Neutro Abs: 3.7 10*3/uL (ref 1.4–7.7)
Neutrophils Relative %: 52.3 % (ref 43.0–77.0)
Platelets: 269 10*3/uL (ref 150.0–400.0)
RBC: 4.16 Mil/uL (ref 3.87–5.11)
RDW: 13.3 % (ref 11.5–15.5)
WBC: 7.2 10*3/uL (ref 4.0–10.5)

## 2019-11-27 LAB — RENAL FUNCTION PANEL
Albumin: 4.3 g/dL (ref 3.5–5.2)
BUN: 16 mg/dL (ref 6–23)
CO2: 27 mEq/L (ref 19–32)
Calcium: 9.1 mg/dL (ref 8.4–10.5)
Chloride: 105 mEq/L (ref 96–112)
Creatinine, Ser: 0.85 mg/dL (ref 0.40–1.20)
GFR: 73.06 mL/min (ref >60.00)
Glucose, Bld: 84 mg/dL (ref 70–99)
Phosphorus: 3.1 mg/dL (ref 2.3–4.6)
Potassium: 4.6 mEq/L (ref 3.5–5.1)
Sodium: 138 mEq/L (ref 135–145)

## 2019-11-27 LAB — LIPID PANEL
Cholesterol: 197 mg/dL (ref 0–200)
HDL: 68.4 mg/dL (ref >39.00)
LDL Cholesterol: 102 mg/dL — ABNORMAL HIGH (ref 0–99)
NonHDL: 128.95
Total CHOL/HDL Ratio: 3
Triglycerides: 133 mg/dL (ref 0.0–149.0)
VLDL: 26.6 mg/dL (ref 0.0–40.0)

## 2019-11-27 LAB — COMPREHENSIVE METABOLIC PANEL
ALT: 23 U/L (ref 0–35)
AST: 19 U/L (ref 0–37)
Albumin: 4.3 g/dL (ref 3.5–5.2)
Alkaline Phosphatase: 68 U/L (ref 39–117)
BUN: 16 mg/dL (ref 6–23)
CO2: 27 mEq/L (ref 19–32)
Calcium: 9.1 mg/dL (ref 8.4–10.5)
Chloride: 105 mEq/L (ref 96–112)
Creatinine, Ser: 0.85 mg/dL (ref 0.40–1.20)
GFR: 73.06 mL/min (ref >60.00)
Glucose, Bld: 84 mg/dL (ref 70–99)
Potassium: 4.6 mEq/L (ref 3.5–5.1)
Sodium: 138 mEq/L (ref 135–145)
Total Bilirubin: 0.5 mg/dL (ref 0.2–1.2)
Total Protein: 6.9 g/dL (ref 6.0–8.3)

## 2019-11-27 LAB — URINALYSIS, ROUTINE W REFLEX MICROSCOPIC
Bilirubin Urine: NEGATIVE
Hgb urine dipstick: NEGATIVE
Ketones, ur: NEGATIVE
Leukocytes,Ua: NEGATIVE
Nitrite: NEGATIVE
Specific Gravity, Urine: 1.01 (ref 1.000–1.030)
Total Protein, Urine: NEGATIVE
Urine Glucose: NEGATIVE
Urobilinogen, UA: 0.2 (ref 0.0–1.0)
pH: 6 (ref 5.0–8.0)

## 2019-11-27 LAB — HEMOGLOBIN A1C: Hgb A1c MFr Bld: 5.7 % (ref 4.6–6.5)

## 2019-11-27 LAB — TSH: TSH: 3.65 u[IU]/mL (ref 0.35–4.50)

## 2019-11-27 NOTE — Addendum Note (Signed)
Addended by: Jacob Moores on: 11/27/2019 08:08 AM   Modules accepted: Orders

## 2019-12-01 ENCOUNTER — Other Ambulatory Visit: Payer: Self-pay | Admitting: Internal Medicine

## 2019-12-01 ENCOUNTER — Other Ambulatory Visit: Payer: Self-pay

## 2019-12-01 MED ORDER — HYDROCORTISONE ACETATE 25 MG RE SUPP: RECTAL | 1 refills | Status: DC

## 2019-12-01 MED FILL — HYDROCORTISONE AC 25 MG SUP: 25 | 30 days supply | Qty: 60 | Fill #0

## 2019-12-12 DIAGNOSIS — J301 Allergic rhinitis due to pollen: Secondary | ICD-10-CM | POA: Diagnosis not present

## 2019-12-12 DIAGNOSIS — J3089 Other allergic rhinitis: Secondary | ICD-10-CM | POA: Diagnosis not present

## 2019-12-12 DIAGNOSIS — J3081 Allergic rhinitis due to animal (cat) (dog) hair and dander: Secondary | ICD-10-CM | POA: Diagnosis not present

## 2019-12-15 DIAGNOSIS — J3089 Other allergic rhinitis: Secondary | ICD-10-CM | POA: Diagnosis not present

## 2019-12-15 DIAGNOSIS — E785 Hyperlipidemia, unspecified: Secondary | ICD-10-CM | POA: Diagnosis not present

## 2019-12-15 DIAGNOSIS — N139 Obstructive and reflux uropathy, unspecified: Secondary | ICD-10-CM | POA: Diagnosis not present

## 2019-12-15 DIAGNOSIS — J3081 Allergic rhinitis due to animal (cat) (dog) hair and dander: Secondary | ICD-10-CM | POA: Diagnosis not present

## 2019-12-15 DIAGNOSIS — N182 Chronic kidney disease, stage 2 (mild): Secondary | ICD-10-CM | POA: Diagnosis not present

## 2019-12-15 MED FILL — HYDROCORTISONE AC 25 MG SUP: 25 | 30 days supply | Qty: 60 | Fill #0

## 2019-12-30 ENCOUNTER — Encounter: Payer: Self-pay | Admitting: Internal Medicine

## 2019-12-30 DIAGNOSIS — Z1231 Encounter for screening mammogram for malignant neoplasm of breast: Secondary | ICD-10-CM | POA: Diagnosis not present

## 2019-12-30 DIAGNOSIS — J301 Allergic rhinitis due to pollen: Secondary | ICD-10-CM | POA: Diagnosis not present

## 2019-12-30 DIAGNOSIS — J3089 Other allergic rhinitis: Secondary | ICD-10-CM | POA: Diagnosis not present

## 2019-12-30 DIAGNOSIS — J3081 Allergic rhinitis due to animal (cat) (dog) hair and dander: Secondary | ICD-10-CM | POA: Diagnosis not present

## 2019-12-30 LAB — HM MAMMOGRAPHY

## 2020-01-02 ENCOUNTER — Encounter: Payer: Self-pay | Admitting: Internal Medicine

## 2020-01-02 NOTE — Progress Notes (Unsigned)
Outside notes received. Information abstracted. Notes sent to scan.  

## 2020-01-17 MED FILL — ESTRADIOL 0.025 MG PATCH: 0.025 | 84 days supply | Qty: 24 | Fill #1

## 2020-01-17 MED FILL — traZODone HCL 50 MG TABS: 50 | 30 days supply | Qty: 30 | Fill #3

## 2020-01-19 DIAGNOSIS — J3081 Allergic rhinitis due to animal (cat) (dog) hair and dander: Secondary | ICD-10-CM | POA: Diagnosis not present

## 2020-01-19 DIAGNOSIS — J3089 Other allergic rhinitis: Secondary | ICD-10-CM | POA: Diagnosis not present

## 2020-01-19 DIAGNOSIS — J301 Allergic rhinitis due to pollen: Secondary | ICD-10-CM | POA: Diagnosis not present

## 2020-01-20 ENCOUNTER — Encounter: Payer: Self-pay | Admitting: Internal Medicine

## 2020-01-26 ENCOUNTER — Telehealth: Payer: Self-pay | Admitting: Hematology and Oncology

## 2020-01-26 NOTE — Telephone Encounter (Signed)
Received a cal from Ms. Maria Kane who stated she received a letter from [facility] mammography to be seen in the high risk breast clinic. Pt has been scheduled to see Dr. Lindi Adie on 1/6 at 1pm.

## 2020-02-01 ENCOUNTER — Other Ambulatory Visit (HOSPITAL_COMMUNITY): Payer: Self-pay | Admitting: Dermatology

## 2020-02-02 MED FILL — FINASTERIDE 5 MG TABLET: 5 | 90 days supply | Qty: 90 | Fill #0

## 2020-02-03 DIAGNOSIS — J3081 Allergic rhinitis due to animal (cat) (dog) hair and dander: Secondary | ICD-10-CM | POA: Diagnosis not present

## 2020-02-03 DIAGNOSIS — J3089 Other allergic rhinitis: Secondary | ICD-10-CM | POA: Diagnosis not present

## 2020-02-12 DIAGNOSIS — J301 Allergic rhinitis due to pollen: Secondary | ICD-10-CM | POA: Diagnosis not present

## 2020-02-12 DIAGNOSIS — J3089 Other allergic rhinitis: Secondary | ICD-10-CM | POA: Diagnosis not present

## 2020-02-12 DIAGNOSIS — J3081 Allergic rhinitis due to animal (cat) (dog) hair and dander: Secondary | ICD-10-CM | POA: Diagnosis not present

## 2020-02-17 DIAGNOSIS — J3081 Allergic rhinitis due to animal (cat) (dog) hair and dander: Secondary | ICD-10-CM | POA: Diagnosis not present

## 2020-02-17 DIAGNOSIS — J301 Allergic rhinitis due to pollen: Secondary | ICD-10-CM | POA: Diagnosis not present

## 2020-02-17 DIAGNOSIS — J3089 Other allergic rhinitis: Secondary | ICD-10-CM | POA: Diagnosis not present

## 2020-02-24 DIAGNOSIS — J301 Allergic rhinitis due to pollen: Secondary | ICD-10-CM | POA: Diagnosis not present

## 2020-02-24 DIAGNOSIS — J3081 Allergic rhinitis due to animal (cat) (dog) hair and dander: Secondary | ICD-10-CM | POA: Diagnosis not present

## 2020-02-24 DIAGNOSIS — J3089 Other allergic rhinitis: Secondary | ICD-10-CM | POA: Diagnosis not present

## 2020-02-25 NOTE — Progress Notes (Addendum)
[facility] NOTE  Patient Care Team: Binnie Rail, MD as PCP - General (Internal Medicine) [place], [facility] Eduardo Osier., MD (Urology) Jarome Matin, MD (Dermatology)  CHIEF COMPLAINTS/PURPOSE OF CONSULTATION:  Newly diagnosed high risk for breast cancer  HISTORY OF PRESENTING ILLNESS:  Maria Kane 65 y.o. female is here because of recent diagnosis of high risk for breast cancer. Her most recent mammogram on 12/30/19 showed no evidence of malignancy bilaterally.  She has extensive family history and [facility] mammography place Dr. Isaiah Blakes identified that she has greater than 20% lifetime risk of breast cancer based on extensive family history, she was then referred to [place] for high risk evaluation.  She presents to the clinic today for initial evaluation and discussion of surveillance options.   I reviewed her records extensively and collaborated the history with the patient.  SUMMARY OF ONCOLOGIC HISTORY: Oncology History   No history exists.    MEDICAL HISTORY:  Past Medical History:  Diagnosis Date   Allergic rhinitis    dogs, dust, tree and ragweed   Family history of breast cancer    Family history of colon cancer    Family history of melanoma    Family history of ovarian cancer    Heart palpitations 10/13/2013   History of chickenpox    History of gallstones    Osteopenia    dexa 11/09 -1.4 spine, -1.2 hip   Pineal gland cyst    incidental finding   Primiparous    Single kidney    left removed cong UPJ obstruction;functioning at 23 %    SURGICAL HISTORY: Past Surgical History:  Procedure Laterality Date   ABDOMINAL HYSTERECTOMY  1996   age 79 total severe endometriosis   APPENDECTOMY  2006   CHOLECYSTECTOMY  2001   COLONOSCOPY  04/01/2008   left kidney  2002   removed congential UPJ obstruction    TONSILLECTOMY  1965    SOCIAL HISTORY: Social History   Socioeconomic History   Marital status: Married    Spouse name:  Not on file   Number of children: 2   Years of education: Not on file   Highest education level: Not on file  Occupational History   Occupation: Chief Technology Officer  Tobacco Use   Smoking status: Never Smoker   Smokeless tobacco: Never Used  Scientific laboratory technician Use: Never used  Substance and Sexual Activity   Alcohol use: Yes    Alcohol/week: 9.0 - 12.0 standard drinks    Types: 9 - 12 Glasses of wine per week   Drug use: No   Sexual activity: Yes    Birth control/protection: Surgical  Other Topics Concern   Not on file  Social History Narrative   1/2 brother- schizopherenia   Occupation: Ph.D- Publishing rights manager   Married   HH of 4   Regular exercise- yes, power walking   2 adopted kids 2 dogs   Originally from [place] Strain: Not on [employer] Insecurity: Not on file  Transportation Needs: Not on file  Physical Activity: Not on file  Stress: Not on file  Social Connections: Not on file  Intimate Partner Violence: Not on file    FAMILY HISTORY: Family History  Problem Relation Age of Onset   Breast cancer Mother    Osteoarthritis Mother    Multiple sclerosis Mother    Hypertension Father    Lung cancer Father  Stroke Father    Heart disease Father        pacemaker   Diabetes Sister    Hypertension Sister    Polymyositis Sister    Diabetes Brother    Hypertension Brother    Colon cancer Maternal Aunt        70's?   Colon polyps Neg Hx    Esophageal cancer Neg Hx    Stomach cancer Neg Hx    Rectal cancer Neg Hx     ALLERGIES:  is allergic to anesthetics, amide; clarithromycin; and trazodone and nefazodone.  MEDICATIONS:  Current Outpatient Medications  Medication Sig Dispense Refill   CALCIUM PO Take by mouth.     estradiol (VIVELLE-DOT) 0.025 MG/24HR PLACE 1 PATCH ONTO THE SKIN 2 TIMES A WEEK 8 patch 0   finasteride (PROSCAR) 5 MG tablet Take 5 mg by mouth  daily.      hydrocortisone (ANUSOL-HC) 25 MG suppository UNWRAP AND PLACE 1 SUPPOSITORY RECTALLY 2 TIMES DAILY AS NEEDED FOR HEMORRHOIDS OR ITCHING. 60 suppository 1   MINOXIDIL, TOPICAL, 5 % SOLN Apply topically as needed. Patient uses it 1-2 times daily     Multiple Vitamin (MULTIVITAMIN) tablet Take 1 tablet by mouth daily.     Probiotic Product (PROBIOTIC DAILY PO) Take by mouth.     PROCTOZONE-HC 2.5 % rectal cream APPLY RECTALLY 2 TIMES DAILY. 30 g 0   S-Adenosylmethionine (SAM-E PO) Take by mouth.     traZODone (DESYREL) 50 MG tablet Take 0.5-1 tablets (25-50 mg total) by mouth at bedtime as needed for sleep. 30 tablet 3   No current facility-administered medications for this visit.    REVIEW OF SYSTEMS:    All other systems were reviewed with the patient and are negative.  PHYSICAL EXAMINATION: ECOG PERFORMANCE STATUS: 1 - Symptomatic but completely ambulatory  Vitals:   02/26/20 1321  BP: 104/75  Pulse: 82  Resp: 16  Temp: (!) 97.5 F (36.4 C)  SpO2: 97%   Filed Weights   02/26/20 1321  Weight: 152 lb 8 oz (69.2 kg)       LABORATORY DATA:  I have reviewed the data as listed Lab Results  Component Value Date   WBC 7.2 11/27/2019   HGB 13.8 11/27/2019   HCT 41.0 11/27/2019   MCV 98.5 11/27/2019   PLT 269.0 11/27/2019   Lab Results  Component Value Date   NA 138 11/27/2019   NA 138 11/27/2019   K 4.6 11/27/2019   K 4.6 11/27/2019   CL 105 11/27/2019   CL 105 11/27/2019   CO2 27 11/27/2019   CO2 27 11/27/2019    RADIOGRAPHIC STUDIES: I have personally reviewed the radiological reports and agreed with the findings in the report.  ASSESSMENT AND PLAN:  At high risk for breast cancer Based on Tyrer Cusick risk score: Patient's mother, sister, 2 cousins have had breast cancers Lifetime risk 17.1% 10-year risk 10.3% Prior genetic testing: Negative in 2015 We will request Roma Kayser to contact the patient if she needs more current genetic  testing.  Counseling: I discussed with the patient about her risk factors that are reversible which include Hormone replacement therapy Alcohol consumption.  (Patient drinks 7 to 8 glasses of wine per week)  Patient is unable to stop hormone replacement therapy because of menopausal symptoms. She is willing to cut down on alcohol use. We discussed risks and benefits of tamoxifen and decided not to initiate such therapy.  Surveillance: Unfortunately patient only has 1  kidney and therefore we cannot use MRIs for breast cancer surveillance Plan is to obtain mammograms every 6 months.  Return to clinic in 1 year for breast exams and follow-up.   All questions were answered. The patient knows to call the clinic with any problems, questions or concerns.   Sabas Sous, MD, MPH .02/26/2020    I, Molly Dorshimer, am acting as scribe for Serena Croissant, MD.  I have reviewed the above documentation for accuracy and completeness, and I agree with the above.

## 2020-02-26 ENCOUNTER — Inpatient Hospital Stay: Payer: 59 | Attending: Hematology and Oncology | Admitting: Hematology and Oncology

## 2020-02-26 ENCOUNTER — Other Ambulatory Visit: Payer: Self-pay | Admitting: Internal Medicine

## 2020-02-26 ENCOUNTER — Other Ambulatory Visit: Payer: Self-pay

## 2020-02-26 VITALS — BP 104/75 | HR 82 | Temp 97.5°F | Resp 16 | Ht 65.0 in | Wt 152.5 lb

## 2020-02-26 DIAGNOSIS — Z905 Acquired absence of kidney: Secondary | ICD-10-CM | POA: Diagnosis not present

## 2020-02-26 DIAGNOSIS — Z7289 Other problems related to lifestyle: Secondary | ICD-10-CM | POA: Diagnosis not present

## 2020-02-26 DIAGNOSIS — Z9189 Other specified personal risk factors, not elsewhere classified: Secondary | ICD-10-CM | POA: Diagnosis not present

## 2020-02-26 DIAGNOSIS — Z79890 Hormone replacement therapy (postmenopausal): Secondary | ICD-10-CM | POA: Insufficient documentation

## 2020-02-26 DIAGNOSIS — Z800 Family history of malignant neoplasm of digestive organs: Secondary | ICD-10-CM | POA: Insufficient documentation

## 2020-02-26 DIAGNOSIS — N951 Menopausal and female climacteric states: Secondary | ICD-10-CM | POA: Diagnosis not present

## 2020-02-26 DIAGNOSIS — Z803 Family history of malignant neoplasm of breast: Secondary | ICD-10-CM | POA: Insufficient documentation

## 2020-02-26 DIAGNOSIS — Z801 Family history of malignant neoplasm of trachea, bronchus and lung: Secondary | ICD-10-CM | POA: Diagnosis not present

## 2020-02-26 MED FILL — FINASTERIDE 5 MG TABLET: 5 | 90 days supply | Qty: 90 | Fill #0

## 2020-02-26 NOTE — Assessment & Plan Note (Signed)
Based on Tyrer Cusick risk score: Lifetime risk 31% 10-year risk 12.6%  Counseling: I discussed with the patient about her risk factors that are reversible which include Hormone replacement therapy Alcohol consumption.  (Patient drinks 7 to 8 glasses of wine per week)  Patient is unable to stop hormone replacement therapy because of menopausal symptoms. She is willing to cut down on alcohol use. We discussed risks and benefits of tamoxifen and decided not to initiate such therapy.  Surveillance: Unfortunately patient only has 1 kidney and therefore we cannot use MRIs for breast cancer surveillance Plan is to obtain mammograms every 6 months.  Return to clinic in 1 year for breast exams and follow-up.

## 2020-02-27 ENCOUNTER — Other Ambulatory Visit: Payer: Self-pay | Admitting: Internal Medicine

## 2020-02-27 ENCOUNTER — Encounter: Payer: Self-pay | Admitting: Hematology and Oncology

## 2020-02-27 MED FILL — traZODone HCL 50 MG TABS: 50 | 30 days supply | Qty: 30 | Fill #0

## 2020-03-02 DIAGNOSIS — J3081 Allergic rhinitis due to animal (cat) (dog) hair and dander: Secondary | ICD-10-CM | POA: Diagnosis not present

## 2020-03-02 DIAGNOSIS — J301 Allergic rhinitis due to pollen: Secondary | ICD-10-CM | POA: Diagnosis not present

## 2020-03-02 DIAGNOSIS — J3089 Other allergic rhinitis: Secondary | ICD-10-CM | POA: Diagnosis not present

## 2020-03-09 DIAGNOSIS — J3081 Allergic rhinitis due to animal (cat) (dog) hair and dander: Secondary | ICD-10-CM | POA: Diagnosis not present

## 2020-03-09 DIAGNOSIS — J301 Allergic rhinitis due to pollen: Secondary | ICD-10-CM | POA: Diagnosis not present

## 2020-03-09 DIAGNOSIS — J3089 Other allergic rhinitis: Secondary | ICD-10-CM | POA: Diagnosis not present

## 2020-03-26 DIAGNOSIS — J301 Allergic rhinitis due to pollen: Secondary | ICD-10-CM | POA: Diagnosis not present

## 2020-03-26 DIAGNOSIS — J3089 Other allergic rhinitis: Secondary | ICD-10-CM | POA: Diagnosis not present

## 2020-03-26 DIAGNOSIS — J3081 Allergic rhinitis due to animal (cat) (dog) hair and dander: Secondary | ICD-10-CM | POA: Diagnosis not present

## 2020-03-30 DIAGNOSIS — Z85828 Personal history of other malignant neoplasm of skin: Secondary | ICD-10-CM | POA: Diagnosis not present

## 2020-03-30 DIAGNOSIS — C44722 Squamous cell carcinoma of skin of right lower limb, including hip: Secondary | ICD-10-CM | POA: Diagnosis not present

## 2020-03-30 DIAGNOSIS — D485 Neoplasm of uncertain behavior of skin: Secondary | ICD-10-CM | POA: Diagnosis not present

## 2020-04-15 DIAGNOSIS — J3081 Allergic rhinitis due to animal (cat) (dog) hair and dander: Secondary | ICD-10-CM | POA: Diagnosis not present

## 2020-04-15 DIAGNOSIS — J301 Allergic rhinitis due to pollen: Secondary | ICD-10-CM | POA: Diagnosis not present

## 2020-04-15 DIAGNOSIS — J3089 Other allergic rhinitis: Secondary | ICD-10-CM | POA: Diagnosis not present

## 2020-04-22 MED FILL — ESTRADIOL 0.025 MG PATCH: 0.025 | 84 days supply | Qty: 24 | Fill #2

## 2020-04-26 MED FILL — traZODone HCL 50 MG TABS: 50 | 30 days supply | Qty: 30 | Fill #1

## 2020-05-05 ENCOUNTER — Other Ambulatory Visit (HOSPITAL_COMMUNITY): Payer: Self-pay | Admitting: Dermatology

## 2020-05-05 DIAGNOSIS — J301 Allergic rhinitis due to pollen: Secondary | ICD-10-CM | POA: Diagnosis not present

## 2020-05-05 DIAGNOSIS — J3089 Other allergic rhinitis: Secondary | ICD-10-CM | POA: Diagnosis not present

## 2020-05-05 DIAGNOSIS — J3081 Allergic rhinitis due to animal (cat) (dog) hair and dander: Secondary | ICD-10-CM | POA: Diagnosis not present

## 2020-05-05 DIAGNOSIS — L661 Lichen planopilaris: Secondary | ICD-10-CM | POA: Diagnosis not present

## 2020-05-06 ENCOUNTER — Other Ambulatory Visit (HOSPITAL_COMMUNITY): Payer: Self-pay | Admitting: Dermatology

## 2020-05-06 DIAGNOSIS — C44722 Squamous cell carcinoma of skin of right lower limb, including hip: Secondary | ICD-10-CM | POA: Diagnosis not present

## 2020-05-06 DIAGNOSIS — Z85828 Personal history of other malignant neoplasm of skin: Secondary | ICD-10-CM | POA: Diagnosis not present

## 2020-05-10 ENCOUNTER — Encounter: Payer: Self-pay | Admitting: Hematology and Oncology

## 2020-05-10 ENCOUNTER — Encounter: Payer: Self-pay | Admitting: Internal Medicine

## 2020-05-10 DIAGNOSIS — R7303 Prediabetes: Secondary | ICD-10-CM

## 2020-05-14 ENCOUNTER — Other Ambulatory Visit (INDEPENDENT_AMBULATORY_CARE_PROVIDER_SITE_OTHER): Payer: 59

## 2020-05-14 ENCOUNTER — Other Ambulatory Visit (HOSPITAL_BASED_OUTPATIENT_CLINIC_OR_DEPARTMENT_OTHER): Payer: Self-pay

## 2020-05-14 DIAGNOSIS — R7303 Prediabetes: Secondary | ICD-10-CM

## 2020-05-14 LAB — HEMOGLOBIN A1C: Hgb A1c MFr Bld: 5.6 % (ref 4.6–6.5)

## 2020-05-20 DIAGNOSIS — Z85828 Personal history of other malignant neoplasm of skin: Secondary | ICD-10-CM | POA: Diagnosis not present

## 2020-05-20 DIAGNOSIS — L738 Other specified follicular disorders: Secondary | ICD-10-CM | POA: Diagnosis not present

## 2020-05-20 DIAGNOSIS — D225 Melanocytic nevi of trunk: Secondary | ICD-10-CM | POA: Diagnosis not present

## 2020-05-20 DIAGNOSIS — L918 Other hypertrophic disorders of the skin: Secondary | ICD-10-CM | POA: Diagnosis not present

## 2020-05-20 DIAGNOSIS — D2372 Other benign neoplasm of skin of left lower limb, including hip: Secondary | ICD-10-CM | POA: Diagnosis not present

## 2020-05-20 DIAGNOSIS — D235 Other benign neoplasm of skin of trunk: Secondary | ICD-10-CM | POA: Diagnosis not present

## 2020-05-20 DIAGNOSIS — L820 Inflamed seborrheic keratosis: Secondary | ICD-10-CM | POA: Diagnosis not present

## 2020-05-20 DIAGNOSIS — D1801 Hemangioma of skin and subcutaneous tissue: Secondary | ICD-10-CM | POA: Diagnosis not present

## 2020-05-21 MED FILL — FINASTERIDE 5 MG TABLET: 5 | 90 days supply | Qty: 90 | Fill #1

## 2020-05-25 ENCOUNTER — Other Ambulatory Visit (HOSPITAL_COMMUNITY): Payer: Self-pay

## 2020-05-26 DIAGNOSIS — J3089 Other allergic rhinitis: Secondary | ICD-10-CM | POA: Diagnosis not present

## 2020-05-26 DIAGNOSIS — J301 Allergic rhinitis due to pollen: Secondary | ICD-10-CM | POA: Diagnosis not present

## 2020-05-26 DIAGNOSIS — J3081 Allergic rhinitis due to animal (cat) (dog) hair and dander: Secondary | ICD-10-CM | POA: Diagnosis not present

## 2020-06-09 DIAGNOSIS — H1045 Other chronic allergic conjunctivitis: Secondary | ICD-10-CM | POA: Diagnosis not present

## 2020-06-09 DIAGNOSIS — J3081 Allergic rhinitis due to animal (cat) (dog) hair and dander: Secondary | ICD-10-CM | POA: Diagnosis not present

## 2020-06-09 DIAGNOSIS — J301 Allergic rhinitis due to pollen: Secondary | ICD-10-CM | POA: Diagnosis not present

## 2020-06-09 DIAGNOSIS — J3089 Other allergic rhinitis: Secondary | ICD-10-CM | POA: Diagnosis not present

## 2020-06-11 DIAGNOSIS — H5203 Hypermetropia, bilateral: Secondary | ICD-10-CM | POA: Diagnosis not present

## 2020-06-29 DIAGNOSIS — J3089 Other allergic rhinitis: Secondary | ICD-10-CM | POA: Diagnosis not present

## 2020-06-29 DIAGNOSIS — J3081 Allergic rhinitis due to animal (cat) (dog) hair and dander: Secondary | ICD-10-CM | POA: Diagnosis not present

## 2020-06-29 DIAGNOSIS — J301 Allergic rhinitis due to pollen: Secondary | ICD-10-CM | POA: Diagnosis not present

## 2020-07-20 DIAGNOSIS — J3089 Other allergic rhinitis: Secondary | ICD-10-CM | POA: Diagnosis not present

## 2020-07-20 DIAGNOSIS — J3081 Allergic rhinitis due to animal (cat) (dog) hair and dander: Secondary | ICD-10-CM | POA: Diagnosis not present

## 2020-07-20 DIAGNOSIS — J301 Allergic rhinitis due to pollen: Secondary | ICD-10-CM | POA: Diagnosis not present

## 2020-07-29 ENCOUNTER — Other Ambulatory Visit (HOSPITAL_COMMUNITY): Payer: Self-pay

## 2020-07-29 MED FILL — Finasteride Tab 5 MG: ORAL | 90 days supply | Qty: 90 | Fill #0 | Status: AC

## 2020-07-29 MED FILL — Estradiol TD Patch Twice Weekly 0.025 MG/24HR: TRANSDERMAL | 84 days supply | Qty: 24 | Fill #0 | Status: AC

## 2020-08-02 ENCOUNTER — Other Ambulatory Visit (HOSPITAL_COMMUNITY): Payer: Self-pay

## 2020-08-10 DIAGNOSIS — J3089 Other allergic rhinitis: Secondary | ICD-10-CM | POA: Diagnosis not present

## 2020-08-10 DIAGNOSIS — J3081 Allergic rhinitis due to animal (cat) (dog) hair and dander: Secondary | ICD-10-CM | POA: Diagnosis not present

## 2020-08-10 DIAGNOSIS — J301 Allergic rhinitis due to pollen: Secondary | ICD-10-CM | POA: Diagnosis not present

## 2020-08-31 DIAGNOSIS — J3081 Allergic rhinitis due to animal (cat) (dog) hair and dander: Secondary | ICD-10-CM | POA: Diagnosis not present

## 2020-08-31 DIAGNOSIS — J3089 Other allergic rhinitis: Secondary | ICD-10-CM | POA: Diagnosis not present

## 2020-08-31 DIAGNOSIS — J301 Allergic rhinitis due to pollen: Secondary | ICD-10-CM | POA: Diagnosis not present

## 2020-09-14 ENCOUNTER — Other Ambulatory Visit (HOSPITAL_COMMUNITY): Payer: Self-pay

## 2020-09-14 ENCOUNTER — Other Ambulatory Visit: Payer: Self-pay | Admitting: Internal Medicine

## 2020-09-14 MED FILL — Betamethasone Dipropionate Lotion 0.05%: CUTANEOUS | 20 days supply | Qty: 60 | Fill #0 | Status: AC

## 2020-09-14 MED FILL — Hydrocortisone Acetate Suppos 25 MG: RECTAL | 30 days supply | Qty: 60 | Fill #0 | Status: AC

## 2020-09-15 ENCOUNTER — Other Ambulatory Visit (HOSPITAL_COMMUNITY): Payer: Self-pay

## 2020-09-15 MED ORDER — TRAZODONE HCL 50 MG PO TABS
ORAL_TABLET | Freq: Every evening | ORAL | 1 refills | Status: DC | PRN
  Filled 2020-09-15: qty 30, 30d supply, fill #0
  Filled 2020-12-09: qty 30, 30d supply, fill #1

## 2020-09-20 DIAGNOSIS — J301 Allergic rhinitis due to pollen: Secondary | ICD-10-CM | POA: Diagnosis not present

## 2020-09-20 DIAGNOSIS — J3089 Other allergic rhinitis: Secondary | ICD-10-CM | POA: Diagnosis not present

## 2020-09-20 DIAGNOSIS — J3081 Allergic rhinitis due to animal (cat) (dog) hair and dander: Secondary | ICD-10-CM | POA: Diagnosis not present

## 2020-10-11 DIAGNOSIS — J3081 Allergic rhinitis due to animal (cat) (dog) hair and dander: Secondary | ICD-10-CM | POA: Diagnosis not present

## 2020-10-11 DIAGNOSIS — J301 Allergic rhinitis due to pollen: Secondary | ICD-10-CM | POA: Diagnosis not present

## 2020-10-11 DIAGNOSIS — J3089 Other allergic rhinitis: Secondary | ICD-10-CM | POA: Diagnosis not present

## 2020-10-24 ENCOUNTER — Other Ambulatory Visit: Payer: Self-pay | Admitting: Internal Medicine

## 2020-10-24 MED ORDER — ESTRADIOL 0.025 MG/24HR TD PTTW
MEDICATED_PATCH | TRANSDERMAL | 3 refills | Status: DC
Start: 2020-10-24 — End: 2021-02-23
  Filled 2020-10-24: qty 24, 84d supply, fill #0
  Filled 2021-01-10: qty 24, 84d supply, fill #1

## 2020-10-24 MED FILL — Finasteride Tab 5 MG: ORAL | 90 days supply | Qty: 90 | Fill #1 | Status: AC

## 2020-10-24 MED FILL — Betamethasone Dipropionate Lotion 0.05%: CUTANEOUS | 20 days supply | Qty: 60 | Fill #1 | Status: AC

## 2020-10-26 ENCOUNTER — Other Ambulatory Visit (HOSPITAL_COMMUNITY): Payer: Self-pay

## 2020-10-27 ENCOUNTER — Other Ambulatory Visit (HOSPITAL_COMMUNITY): Payer: Self-pay

## 2020-11-01 DIAGNOSIS — J301 Allergic rhinitis due to pollen: Secondary | ICD-10-CM | POA: Diagnosis not present

## 2020-11-01 DIAGNOSIS — J3089 Other allergic rhinitis: Secondary | ICD-10-CM | POA: Diagnosis not present

## 2020-11-01 DIAGNOSIS — J3081 Allergic rhinitis due to animal (cat) (dog) hair and dander: Secondary | ICD-10-CM | POA: Diagnosis not present

## 2020-11-18 ENCOUNTER — Ambulatory Visit: Payer: 59

## 2020-11-18 ENCOUNTER — Other Ambulatory Visit (HOSPITAL_BASED_OUTPATIENT_CLINIC_OR_DEPARTMENT_OTHER): Payer: Self-pay

## 2020-11-18 MED ORDER — INFLUENZA VAC SPLIT QUAD 0.5 ML IM SUSY
PREFILLED_SYRINGE | INTRAMUSCULAR | 0 refills | Status: DC
  Filled 2020-11-18: qty 0.5, 1d supply, fill #0

## 2020-11-19 NOTE — Progress Notes (Signed)
   Covid-19 Vaccination Clinic  Name:  Amaka Gluth    MRN: [id number] DOB: 12/30/1955  11/19/2020  Ms. Paradiso was observed post Covid-19 immunization for 15 minutes without incident. She was provided with Vaccine Information Sheet and instruction to access the V-Safe system.   Ms. Funk was instructed to call 911 with any severe reactions post vaccine: Difficulty breathing  Swelling of face and throat  A fast heartbeat  A bad rash all over body  Dizziness and weakness

## 2020-11-22 ENCOUNTER — Other Ambulatory Visit (HOSPITAL_BASED_OUTPATIENT_CLINIC_OR_DEPARTMENT_OTHER): Payer: Self-pay

## 2020-11-22 DIAGNOSIS — J3089 Other allergic rhinitis: Secondary | ICD-10-CM | POA: Diagnosis not present

## 2020-11-22 DIAGNOSIS — J301 Allergic rhinitis due to pollen: Secondary | ICD-10-CM | POA: Diagnosis not present

## 2020-11-22 DIAGNOSIS — J3081 Allergic rhinitis due to animal (cat) (dog) hair and dander: Secondary | ICD-10-CM | POA: Diagnosis not present

## 2020-12-07 DIAGNOSIS — J301 Allergic rhinitis due to pollen: Secondary | ICD-10-CM | POA: Diagnosis not present

## 2020-12-08 DIAGNOSIS — J3089 Other allergic rhinitis: Secondary | ICD-10-CM | POA: Diagnosis not present

## 2020-12-08 DIAGNOSIS — J3081 Allergic rhinitis due to animal (cat) (dog) hair and dander: Secondary | ICD-10-CM | POA: Diagnosis not present

## 2020-12-09 ENCOUNTER — Encounter: Payer: Self-pay | Admitting: Internal Medicine

## 2020-12-09 ENCOUNTER — Other Ambulatory Visit (HOSPITAL_COMMUNITY): Payer: Self-pay

## 2020-12-09 ENCOUNTER — Other Ambulatory Visit: Payer: Self-pay | Admitting: Internal Medicine

## 2020-12-09 DIAGNOSIS — Z0000 Encounter for general adult medical examination without abnormal findings: Secondary | ICD-10-CM

## 2020-12-09 DIAGNOSIS — R739 Hyperglycemia, unspecified: Secondary | ICD-10-CM

## 2020-12-09 DIAGNOSIS — E7849 Other hyperlipidemia: Secondary | ICD-10-CM

## 2020-12-09 MED ORDER — ESTRADIOL 0.025 MG/24HR TD PTTW
MEDICATED_PATCH | TRANSDERMAL | 0 refills | Status: DC
  Filled 2020-12-09: qty 8, fill #0
  Filled 2021-04-21: qty 8, 28d supply, fill #0

## 2020-12-10 DIAGNOSIS — J301 Allergic rhinitis due to pollen: Secondary | ICD-10-CM | POA: Diagnosis not present

## 2020-12-10 DIAGNOSIS — J3089 Other allergic rhinitis: Secondary | ICD-10-CM | POA: Diagnosis not present

## 2020-12-10 DIAGNOSIS — J3081 Allergic rhinitis due to animal (cat) (dog) hair and dander: Secondary | ICD-10-CM | POA: Diagnosis not present

## 2020-12-29 DIAGNOSIS — J3081 Allergic rhinitis due to animal (cat) (dog) hair and dander: Secondary | ICD-10-CM | POA: Diagnosis not present

## 2020-12-29 DIAGNOSIS — J301 Allergic rhinitis due to pollen: Secondary | ICD-10-CM | POA: Diagnosis not present

## 2020-12-29 DIAGNOSIS — J3089 Other allergic rhinitis: Secondary | ICD-10-CM | POA: Diagnosis not present

## 2020-12-30 DIAGNOSIS — Z1231 Encounter for screening mammogram for malignant neoplasm of breast: Secondary | ICD-10-CM | POA: Diagnosis not present

## 2020-12-30 LAB — HM MAMMOGRAPHY

## 2021-01-10 ENCOUNTER — Other Ambulatory Visit (HOSPITAL_COMMUNITY): Payer: Self-pay

## 2021-01-10 DIAGNOSIS — Z85828 Personal history of other malignant neoplasm of skin: Secondary | ICD-10-CM | POA: Diagnosis not present

## 2021-01-18 ENCOUNTER — Encounter: Payer: Self-pay | Admitting: Internal Medicine

## 2021-01-18 DIAGNOSIS — Z0000 Encounter for general adult medical examination without abnormal findings: Secondary | ICD-10-CM

## 2021-01-18 DIAGNOSIS — E7849 Other hyperlipidemia: Secondary | ICD-10-CM

## 2021-01-18 DIAGNOSIS — R739 Hyperglycemia, unspecified: Secondary | ICD-10-CM

## 2021-01-19 DIAGNOSIS — J3081 Allergic rhinitis due to animal (cat) (dog) hair and dander: Secondary | ICD-10-CM | POA: Diagnosis not present

## 2021-01-19 DIAGNOSIS — J301 Allergic rhinitis due to pollen: Secondary | ICD-10-CM | POA: Diagnosis not present

## 2021-01-19 DIAGNOSIS — J3089 Other allergic rhinitis: Secondary | ICD-10-CM | POA: Diagnosis not present

## 2021-01-21 ENCOUNTER — Other Ambulatory Visit: Payer: 59

## 2021-01-21 ENCOUNTER — Encounter: Payer: 59 | Admitting: Internal Medicine

## 2021-01-21 ENCOUNTER — Other Ambulatory Visit (INDEPENDENT_AMBULATORY_CARE_PROVIDER_SITE_OTHER): Payer: 59

## 2021-01-21 DIAGNOSIS — Z0000 Encounter for general adult medical examination without abnormal findings: Secondary | ICD-10-CM | POA: Diagnosis not present

## 2021-01-21 DIAGNOSIS — R739 Hyperglycemia, unspecified: Secondary | ICD-10-CM

## 2021-01-21 DIAGNOSIS — E7849 Other hyperlipidemia: Secondary | ICD-10-CM | POA: Diagnosis not present

## 2021-01-21 LAB — COMPREHENSIVE METABOLIC PANEL
ALT: 21 U/L (ref 0–35)
AST: 18 U/L (ref 0–37)
Albumin: 4.6 g/dL (ref 3.5–5.2)
Alkaline Phosphatase: 61 U/L (ref 39–117)
BUN: 16 mg/dL (ref 6–23)
CO2: 25 mEq/L (ref 19–32)
Calcium: 9.6 mg/dL (ref 8.4–10.5)
Chloride: 105 mEq/L (ref 96–112)
Creatinine, Ser: 0.89 mg/dL (ref 0.40–1.20)
GFR: 68.78 mL/min (ref >60.00)
Glucose, Bld: 92 mg/dL (ref 70–99)
Potassium: 4.2 mEq/L (ref 3.5–5.1)
Sodium: 139 mEq/L (ref 135–145)
Total Bilirubin: 0.5 mg/dL (ref 0.2–1.2)
Total Protein: 7.2 g/dL (ref 6.0–8.3)

## 2021-01-21 LAB — CBC WITH DIFFERENTIAL/PLATELET
Basophils Absolute: 0.1 10*3/uL (ref 0.0–0.1)
Basophils Relative: 1 % (ref 0.0–3.0)
Eosinophils Absolute: 0.3 10*3/uL (ref 0.0–0.7)
Eosinophils Relative: 3.6 % (ref 0.0–5.0)
HCT: 42.7 % (ref 36.0–46.0)
Hemoglobin: 14.3 g/dL (ref 12.0–15.0)
Lymphocytes Relative: 39.6 % (ref 12.0–46.0)
Lymphs Abs: 2.8 10*3/uL (ref 0.7–4.0)
MCHC: 33.4 g/dL (ref 30.0–36.0)
MCV: 98.2 fl (ref 78.0–100.0)
Monocytes Absolute: 0.6 10*3/uL (ref 0.1–1.0)
Monocytes Relative: 8.6 % (ref 3.0–12.0)
Neutro Abs: 3.4 10*3/uL (ref 1.4–7.7)
Neutrophils Relative %: 47.2 % (ref 43.0–77.0)
Platelets: 268 10*3/uL (ref 150.0–400.0)
RBC: 4.35 Mil/uL (ref 3.87–5.11)
RDW: 13.2 % (ref 11.5–15.5)
WBC: 7.1 10*3/uL (ref 4.0–10.5)

## 2021-01-21 LAB — LIPID PANEL
Cholesterol: 207 mg/dL — ABNORMAL HIGH (ref 0–200)
HDL: 68.4 mg/dL (ref >39.00)
LDL Cholesterol: 106 mg/dL — ABNORMAL HIGH (ref 0–99)
NonHDL: 138.22
Total CHOL/HDL Ratio: 3
Triglycerides: 159 mg/dL — ABNORMAL HIGH (ref 0.0–149.0)
VLDL: 31.8 mg/dL (ref 0.0–40.0)

## 2021-01-21 LAB — TSH: TSH: 4.15 u[IU]/mL (ref 0.35–5.50)

## 2021-01-21 LAB — HEMOGLOBIN A1C: Hgb A1c MFr Bld: 5.5 % (ref 4.6–6.5)

## 2021-01-22 ENCOUNTER — Other Ambulatory Visit: Payer: Self-pay

## 2021-01-25 DIAGNOSIS — J3089 Other allergic rhinitis: Secondary | ICD-10-CM | POA: Diagnosis not present

## 2021-01-25 DIAGNOSIS — J3081 Allergic rhinitis due to animal (cat) (dog) hair and dander: Secondary | ICD-10-CM | POA: Diagnosis not present

## 2021-01-25 DIAGNOSIS — J301 Allergic rhinitis due to pollen: Secondary | ICD-10-CM | POA: Diagnosis not present

## 2021-01-26 ENCOUNTER — Other Ambulatory Visit (HOSPITAL_COMMUNITY): Payer: Self-pay

## 2021-01-28 ENCOUNTER — Other Ambulatory Visit (HOSPITAL_COMMUNITY): Payer: Self-pay

## 2021-01-31 ENCOUNTER — Other Ambulatory Visit (HOSPITAL_COMMUNITY): Payer: Self-pay

## 2021-02-01 DIAGNOSIS — J3081 Allergic rhinitis due to animal (cat) (dog) hair and dander: Secondary | ICD-10-CM | POA: Diagnosis not present

## 2021-02-01 DIAGNOSIS — J3089 Other allergic rhinitis: Secondary | ICD-10-CM | POA: Diagnosis not present

## 2021-02-01 DIAGNOSIS — J301 Allergic rhinitis due to pollen: Secondary | ICD-10-CM | POA: Diagnosis not present

## 2021-02-02 ENCOUNTER — Encounter: Payer: Self-pay | Admitting: Internal Medicine

## 2021-02-02 ENCOUNTER — Encounter: Payer: Self-pay | Admitting: Hematology and Oncology

## 2021-02-02 DIAGNOSIS — Z905 Acquired absence of kidney: Secondary | ICD-10-CM

## 2021-02-02 DIAGNOSIS — M85861 Other specified disorders of bone density and structure, right lower leg: Secondary | ICD-10-CM

## 2021-02-04 ENCOUNTER — Other Ambulatory Visit (HOSPITAL_COMMUNITY): Payer: Self-pay

## 2021-02-07 ENCOUNTER — Other Ambulatory Visit (HOSPITAL_COMMUNITY): Payer: Self-pay

## 2021-02-07 DIAGNOSIS — J301 Allergic rhinitis due to pollen: Secondary | ICD-10-CM | POA: Diagnosis not present

## 2021-02-07 DIAGNOSIS — J3089 Other allergic rhinitis: Secondary | ICD-10-CM | POA: Diagnosis not present

## 2021-02-07 DIAGNOSIS — J3081 Allergic rhinitis due to animal (cat) (dog) hair and dander: Secondary | ICD-10-CM | POA: Diagnosis not present

## 2021-02-08 ENCOUNTER — Other Ambulatory Visit (INDEPENDENT_AMBULATORY_CARE_PROVIDER_SITE_OTHER): Payer: 59

## 2021-02-08 ENCOUNTER — Other Ambulatory Visit (HOSPITAL_COMMUNITY): Payer: Self-pay

## 2021-02-08 DIAGNOSIS — Z905 Acquired absence of kidney: Secondary | ICD-10-CM

## 2021-02-08 DIAGNOSIS — M85861 Other specified disorders of bone density and structure, right lower leg: Secondary | ICD-10-CM | POA: Diagnosis not present

## 2021-02-08 DIAGNOSIS — M85862 Other specified disorders of bone density and structure, left lower leg: Secondary | ICD-10-CM | POA: Diagnosis not present

## 2021-02-08 LAB — URINALYSIS, ROUTINE W REFLEX MICROSCOPIC
Bilirubin Urine: NEGATIVE
Hgb urine dipstick: NEGATIVE
Ketones, ur: NEGATIVE
Leukocytes,Ua: NEGATIVE
Nitrite: NEGATIVE
RBC / HPF: NONE SEEN (ref 0–?)
Specific Gravity, Urine: <=1.005 — AB (ref 1.000–1.030)
Total Protein, Urine: NEGATIVE
Urine Glucose: NEGATIVE
Urobilinogen, UA: 0.2 (ref 0.0–1.0)
WBC, UA: NONE SEEN (ref 0–?)
pH: 5.5 (ref 5.0–8.0)

## 2021-02-08 LAB — VITAMIN D 25 HYDROXY (VIT D DEFICIENCY, FRACTURES): VITD: 34.49 ng/mL (ref 30.00–100.00)

## 2021-02-08 LAB — MICROALBUMIN / CREATININE URINE RATIO
Creatinine,U: 13.3 mg/dL
Microalb Creat Ratio: 5.3 mg/g (ref 0.0–30.0)
Microalb, Ur: <0.7 mg/dL (ref 0.0–1.9)

## 2021-02-09 ENCOUNTER — Other Ambulatory Visit (HOSPITAL_COMMUNITY): Payer: Self-pay

## 2021-02-09 DIAGNOSIS — J3089 Other allergic rhinitis: Secondary | ICD-10-CM | POA: Diagnosis not present

## 2021-02-09 DIAGNOSIS — J3081 Allergic rhinitis due to animal (cat) (dog) hair and dander: Secondary | ICD-10-CM | POA: Diagnosis not present

## 2021-02-09 DIAGNOSIS — J301 Allergic rhinitis due to pollen: Secondary | ICD-10-CM | POA: Diagnosis not present

## 2021-02-09 MED ORDER — FINASTERIDE 5 MG PO TABS
5.0000 mg | ORAL_TABLET | Freq: Every day | ORAL | 0 refills | Status: DC
  Filled 2021-02-09: qty 60, 60d supply, fill #0

## 2021-02-10 ENCOUNTER — Other Ambulatory Visit (HOSPITAL_COMMUNITY): Payer: Self-pay

## 2021-02-14 ENCOUNTER — Other Ambulatory Visit: Payer: Self-pay | Admitting: Internal Medicine

## 2021-02-15 ENCOUNTER — Other Ambulatory Visit (HOSPITAL_COMMUNITY): Payer: Self-pay

## 2021-02-15 MED ORDER — TRAZODONE HCL 50 MG PO TABS
ORAL_TABLET | Freq: Every evening | ORAL | 1 refills | Status: DC | PRN
  Filled 2021-02-15: qty 30, 30d supply, fill #0
  Filled 2021-03-17: qty 30, 30d supply, fill #1

## 2021-02-21 ENCOUNTER — Encounter: Payer: Self-pay | Admitting: Internal Medicine

## 2021-02-21 NOTE — Progress Notes (Signed)
Subjective:    Patient ID: Maria Kane, female    DOB: [birth year], 66 y.o.   MRN: [id number]   This visit occurred during the SARS-CoV-2 public health emergency.  Safety protocols were in place, including screening questions prior to the visit, additional usage of staff PPE, and extensive cleaning of exam room while observing appropriate contact time as indicated for disinfecting solutions.    HPI She is here for a physical exam.   Dizziness/lightheadedenss -episodic-no headaches.  She is unsure why this happens, but was concerned because she does have a history of a pineal gland cyst and was wondering if that needed to be rechecked.  She denies any obvious pattern with changes in position or movement-she can feel it just sitting sometimes.  She denies changes in vision.  Trazodone 50 mg 6.5 hrs - has hangover.  Melatonin has not worked for her in the past.  She is wondering if there are any other options.   Medications and allergies reviewed with patient and updated if appropriate.  Patient Active Problem List   Diagnosis Date Noted   Pineal gland cyst 02/23/2021   At high risk for breast cancer 02/26/2020   Encounter for screening mammogram for malignant neoplasm of breast 03/27/2018   Hyperglycemia 11/06/2017   Frontal fibrosing alopecia 10/09/2017   Vertigo 01/19/2017   Genetic testing 02/04/2016   Family history of breast cancer    Family history of ovarian cancer    Family history of colon cancer    Family history of melanoma    Solitary pulmonary nodule 12/25/2013   Palpitation 10/16/2013   Hyperlipidemia 10/16/2013   Hemorrhoid 07/25/2012   Insomnia 02/08/2012   Single kidney    Postmenopausal atrophic vaginitis 09/28/2010   Allergic rhinitis    Osteopenia 01/28/2008    Current Outpatient Medications on File Prior to Visit  Medication Sig Dispense Refill   betamethasone dipropionate 0.05 % lotion APPLY TO SCALP AND FRONTAL HAIRLINE ONCE A DAY 60 mL 4    CALCIUM PO Take by mouth.     EPINEPHrine 0.3 mg/0.3 mL IJ SOAJ injection See admin instructions.     estradiol (VIVELLE-DOT) 0.025 MG/24HR PLACE 1 PATCH ONTO THE SKIN 2 TIMES A WEEK 8 patch 0   finasteride (PROSCAR) 5 MG tablet TAKE 1 TABLET BY MOUTH ONCE A DAY 90 tablet 1   Multiple Vitamin (MULTIVITAMIN) tablet Take 1 tablet by mouth daily.     Probiotic Product (PROBIOTIC DAILY PO) Take by mouth.     PROCTOZONE-HC 2.5 % rectal cream APPLY RECTALLY 2 TIMES DAILY. 30 g 0   S-Adenosylmethionine (SAM-E PO) Take by mouth.     traZODone (DESYREL) 50 MG tablet TAKE 1/2 TO 1 TABLET BY MOUTH AT BEDTIME AS NEEDED FOR SLEEP. 30 tablet 1   doxycycline (VIBRAMYCIN) 100 MG capsule TAKE 1 CAPSULE BY MOUTH TWICE A DAY WITH FOOD (Patient not taking: Reported on 02/23/2021) 10 capsule 0   MINOXIDIL, TOPICAL, 5 % SOLN Apply topically as needed. Patient uses it 1-2 times daily (Patient not taking: Reported on 02/23/2021)     No current facility-administered medications on file prior to visit.    Past Medical History:  Diagnosis Date   Allergic rhinitis    dogs, dust, tree and ragweed   Family history of breast cancer    Family history of colon cancer    Family history of melanoma    Family history of ovarian cancer    Heart palpitations 10/13/2013   History  of chickenpox    History of gallstones    Osteopenia    dexa 11/09 -1.4 spine, -1.2 hip   Pineal gland cyst    incidental finding   Primiparous    Single kidney    left removed cong UPJ obstruction;functioning at 66 %    Past Surgical History:  Procedure Laterality Date   ABDOMINAL HYSTERECTOMY  1996   age 71 total severe endometriosis   APPENDECTOMY  2006   CHOLECYSTECTOMY  2001   COLONOSCOPY  04/01/2008   left kidney  2002   removed congential UPJ obstruction    TONSILLECTOMY  1965    Social History   Socioeconomic History   Marital status: Married    Spouse name: Not on file   Number of children: 2   Years of education: Not on  file   Highest education level: Not on file  Occupational History   Occupation: Chief Technology Officer  Tobacco Use   Smoking status: Never   Smokeless tobacco: Never  Vaping Use   Vaping Use: Never used  Substance and Sexual Activity   Alcohol use: Yes    Alcohol/week: 9.0 - 12.0 standard drinks    Types: 9 - 12 Glasses of wine per week   Drug use: No   Sexual activity: Yes    Birth control/protection: Surgical  Other Topics Concern   Not on file  Social History Narrative   1/2 brother- schizopherenia   Occupation: Ph.D- Publishing rights manager   Married   HH of 4   Regular exercise- yes, power walking   2 adopted kids 2 dogs   Originally from [place] Strain: Not on file  Food Insecurity: Not on file  Transportation Needs: Not on file  Physical Activity: Not on file  Stress: Not on file  Social Connections: Not on file    Family History  Problem Relation Age of Onset   Breast cancer Mother    Osteoarthritis Mother    Multiple sclerosis Mother    Hypertension Father    Lung cancer Father    Stroke Father    Heart disease Father        pacemaker   Diabetes Sister    Hypertension Sister    Polymyositis Sister    Diabetes Brother    Hypertension Brother    Colon cancer Maternal Aunt        70's?   Colon polyps Neg Hx    Esophageal cancer Neg Hx    Stomach cancer Neg Hx    Rectal cancer Neg Hx     Review of Systems  Constitutional:  Negative for fever.  Eyes:  Negative for visual disturbance.  Respiratory:  Negative for cough, shortness of breath and wheezing.   Cardiovascular:  Negative for chest pain, palpitations and leg swelling.  Gastrointestinal:  Positive for diarrhea. Negative for abdominal pain, blood in stool, constipation and nausea.       No gerd  Genitourinary:  Negative for dysuria.  Musculoskeletal:  Positive for arthralgias (knees when she walks). Negative for back pain.  Skin:   Negative for rash.  Neurological:  Positive for dizziness and light-headedness. Negative for headaches.  Psychiatric/Behavioral:  Negative for dysphoric mood. The patient is nervous/anxious.       Objective:   Vitals:   02/23/21 1051  BP: 112/78  Pulse: 70  Temp: 98.2 F (36.8 C)  SpO2: 98%   Filed Weights   02/23/21 1051  Weight:  157 lb 6.4 oz (71.4 kg)   Body mass index is 26.19 kg/m.  BP Readings from Last 3 Encounters:  02/23/21 112/78  02/26/20 104/75  11/25/19 116/78    Wt Readings from Last 3 Encounters:  02/23/21 157 lb 6.4 oz (71.4 kg)  02/26/20 152 lb 8 oz (69.2 kg)  11/25/19 148 lb 9.6 oz (67.4 kg)    Depression screen [facility] 2/9 02/23/2021 11/25/2019 11/28/2016 02/08/2012  Decreased Interest 0 0 0 0  Down, Depressed, Hopeless 0 0 0 0  PHQ - 2 Score 0 0 0 0  Altered sleeping 3 - - -  Tired, decreased energy 0 - - -  Change in appetite 0 - - -  Feeling bad or failure about yourself  0 - - -  Trouble concentrating 0 - - -  Moving slowly or fidgety/restless 0 - - -  Suicidal thoughts 0 - - -  PHQ-9 Score 3 - - -  Difficult doing work/chores Not difficult at all - - -     GAD 7 : Generalized Anxiety Score 02/23/2021  Nervous, Anxious, on Edge 1  Control/stop worrying 0  Worry too much - different things 0  Trouble relaxing 0  Restless 0  Easily annoyed or irritable 1  Afraid - awful might happen 0  Total GAD 7 Score 2  Anxiety Difficulty Not difficult at all       Physical Exam Constitutional: She appears well-developed and well-nourished. No distress.  HENT:  Head: Normocephalic and atraumatic.  Right Ear: External ear normal. Normal ear canal and TM Left Ear: External ear normal.  Normal ear canal and TM Mouth/Throat: Oropharynx is clear and moist.  Eyes: Conjunctivae and EOM are normal.  Neck: Neck supple. No tracheal deviation present. No thyromegaly present.  No carotid bruit  Cardiovascular: Normal rate, regular rhythm and normal heart  sounds.   No murmur heard.  No edema. Pulmonary/Chest: Effort normal and breath sounds normal. No respiratory distress. She has no wheezes. She has no rales.  Breast: deferred   Abdominal: Soft. She exhibits no distension. There is no tenderness.  Lymphadenopathy: She has no cervical adenopathy.  Skin: Skin is warm and dry. She is not diaphoretic.  Psychiatric: She has a normal mood and affect. Her behavior is normal.     Lab Results  Component Value Date   WBC 7.1 01/21/2021   HGB 14.3 01/21/2021   HCT 42.7 01/21/2021   PLT 268.0 01/21/2021   GLUCOSE 92 01/21/2021   CHOL 207 (H) 01/21/2021   TRIG 159.0 (H) 01/21/2021   HDL 68.40 01/21/2021   LDLDIRECT 114.0 02/17/2015   LDLCALC 106 (H) 01/21/2021   ALT 21 01/21/2021   AST 18 01/21/2021   NA 139 01/21/2021   K 4.2 01/21/2021   CL 105 01/21/2021   CREATININE 0.89 01/21/2021   BUN 16 01/21/2021   CO2 25 01/21/2021   TSH 4.15 01/21/2021   INR 0.95 10/12/2013   HGBA1C 5.5 01/21/2021   MICROALBUR <0.7 02/08/2021    The 10-year ASCVD risk score (Arnett DK, et al., 2019) is: 3.2%   Values used to calculate the score:     Age: 27 years     Sex: Female     Is Non-Hispanic African American: No     Diabetic: No     Tobacco smoker: No     Systolic Blood Pressure: 989 mmHg     Is BP treated: No     HDL Cholesterol: 68.4 mg/dL  Total Cholesterol: 207 mg/dL      Assessment & Plan:   Physical exam: Screening blood work  reviewed Exercise  not regular Weight  ok for age Substance abuse  none   Reviewed recommended immunizations.   Health Maintenance  Topic Date Due   DEXA SCAN  12/22/2020   COVID-19 Vaccine (3 - Booster for Pfizer series) 03/09/2021 (Originally 01/13/2021)   MAMMOGRAM  12/29/2021   TETANUS/TDAP  02/05/2024   COLONOSCOPY (Pts 45-33yrs Insurance coverage will need to be confirmed)  11/28/2028   INFLUENZA VACCINE  Completed   Hepatitis C Screening  Completed   HIV Screening  Completed   Zoster  Vaccines- Shingrix  Completed   Pneumococcal Vaccine 56-38 Years old  Aged Out   HPV VACCINES  Aged Out     DEXA ordered     See Problem List for Assessment and Plan of chronic medical problems.

## 2021-02-21 NOTE — Patient Instructions (Addendum)
Medications changes include :   none   A Ct scan of your heart and MRI of your brain was ordered.  Someone from their office will call you to schedule an appointment.    Please followup in 1 year    Health Maintenance, Female Adopting a healthy lifestyle and getting preventive care are important in promoting health and wellness. Ask your health care provider about: The right schedule for you to have regular tests and exams. Things you can do on your own to prevent diseases and keep yourself healthy. What should I know about diet, weight, and exercise? Eat a healthy diet  Eat a diet that includes plenty of vegetables, fruits, low-fat dairy products, and lean protein. Do not eat a lot of foods that are high in solid fats, added sugars, or sodium. Maintain a healthy weight Body mass index (BMI) is used to identify weight problems. It estimates body fat based on height and weight. Your health care provider can help determine your BMI and help you achieve or maintain a healthy weight. Get regular exercise Get regular exercise. This is one of the most important things you can do for your health. Most adults should: Exercise for at least 150 minutes each week. The exercise should increase your heart rate and make you sweat (moderate-intensity exercise). Do strengthening exercises at least twice a week. This is in addition to the moderate-intensity exercise. Spend less time sitting. Even light physical activity can be beneficial. Watch cholesterol and blood lipids Have your blood tested for lipids and cholesterol at 64 years of age, then have this test every 5 years. Have your cholesterol levels checked more often if: Your lipid or cholesterol levels are high. You are older than 64 years of age. You are at high risk for heart disease. What should I know about cancer screening? Depending on your health history and family history, you may need to have cancer screening at various ages.  This may include screening for: Breast cancer. Cervical cancer. Colorectal cancer. Skin cancer. Lung cancer. What should I know about heart disease, diabetes, and high blood pressure? Blood pressure and heart disease High blood pressure causes heart disease and increases the risk of stroke. This is more likely to develop in people who have high blood pressure readings or are overweight. Have your blood pressure checked: Every 3-5 years if you are 74-93 years of age. Every year if you are 42 years old or older. Diabetes Have regular diabetes screenings. This checks your fasting blood sugar level. Have the screening done: Once every three years after age 16 if you are at a normal weight and have a low risk for diabetes. More often and at a younger age if you are overweight or have a high risk for diabetes. What should I know about preventing infection? Hepatitis B If you have a higher risk for hepatitis B, you should be screened for this virus. Talk with your health care provider to find out if you are at risk for hepatitis B infection. Hepatitis C Testing is recommended for: Everyone born from [birth year] through 1963. Anyone with known risk factors for hepatitis C. Sexually transmitted infections (STIs) Get screened for STIs, including gonorrhea and chlamydia, if: You are sexually active and are younger than 64 years of age. You are older than 64 years of age and your health care provider tells you that you are at risk for this type of infection. Your sexual activity has changed since you were last screened,  and you are at increased risk for chlamydia or gonorrhea. Ask your health care provider if you are at risk. Ask your health care provider about whether you are at high risk for HIV. Your health care provider may recommend a prescription medicine to help prevent HIV infection. If you choose to take medicine to prevent HIV, you should first get tested for HIV. You should then be tested every 3  months for as long as you are taking the medicine. Pregnancy If you are about to stop having your period (premenopausal) and you may become pregnant, seek counseling before you get pregnant. Take 400 to 800 micrograms (mcg) of folic acid every day if you become pregnant. Ask for birth control (contraception) if you want to prevent pregnancy. Osteoporosis and menopause Osteoporosis is a disease in which the bones lose minerals and strength with aging. This can result in bone fractures. If you are 16 years old or older, or if you are at risk for osteoporosis and fractures, ask your health care provider if you should: Be screened for bone loss. Take a calcium or vitamin D supplement to lower your risk of fractures. Be given hormone replacement therapy (HRT) to treat symptoms of menopause. Follow these instructions at home: Alcohol use Do not drink alcohol if: Your health care provider tells you not to drink. You are pregnant, may be pregnant, or are planning to become pregnant. If you drink alcohol: Limit how much you have to: 0-1 drink a day. Know how much alcohol is in your drink. In the [place] one drink equals one 12 oz bottle of beer (355 mL), one 5 oz glass of wine (148 mL), or one 1 oz glass of hard liquor (44 mL). Lifestyle Do not use any products that contain nicotine or tobacco. These products include cigarettes, chewing tobacco, and vaping devices, such as e-cigarettes. If you need help quitting, ask your health care provider. Do not use street drugs. Do not share needles. Ask your health care provider for help if you need support or information about quitting drugs. General instructions Schedule regular health, dental, and eye exams. Stay current with your vaccines. Tell your health care provider if: You often feel depressed. You have ever been abused or do not feel safe at home. Summary Adopting a healthy lifestyle and getting preventive care are important in promoting health  and wellness. Follow your health care provider's instructions about healthy diet, exercising, and getting tested or screened for diseases. Follow your health care provider's instructions on monitoring your cholesterol and blood pressure. This information is not intended to replace advice given to you by your health care provider. Make sure you discuss any questions you have with your health care provider. Document Revised: 06/28/2020 Document Reviewed: 06/28/2020 Elsevier Patient Education  [employer].

## 2021-02-23 ENCOUNTER — Ambulatory Visit (INDEPENDENT_AMBULATORY_CARE_PROVIDER_SITE_OTHER): Payer: 59 | Admitting: Internal Medicine

## 2021-02-23 ENCOUNTER — Other Ambulatory Visit: Payer: Self-pay

## 2021-02-23 VITALS — BP 112/78 | HR 70 | Temp 98.2°F | Ht 65.0 in | Wt 157.4 lb

## 2021-02-23 DIAGNOSIS — M85861 Other specified disorders of bone density and structure, right lower leg: Secondary | ICD-10-CM | POA: Diagnosis not present

## 2021-02-23 DIAGNOSIS — R739 Hyperglycemia, unspecified: Secondary | ICD-10-CM | POA: Diagnosis not present

## 2021-02-23 DIAGNOSIS — E348 Other specified endocrine disorders: Secondary | ICD-10-CM

## 2021-02-23 DIAGNOSIS — E7849 Other hyperlipidemia: Secondary | ICD-10-CM | POA: Diagnosis not present

## 2021-02-23 DIAGNOSIS — L661 Lichen planopilaris: Secondary | ICD-10-CM

## 2021-02-23 DIAGNOSIS — G4709 Other insomnia: Secondary | ICD-10-CM | POA: Diagnosis not present

## 2021-02-23 DIAGNOSIS — M85862 Other specified disorders of bone density and structure, left lower leg: Secondary | ICD-10-CM

## 2021-02-23 DIAGNOSIS — Z905 Acquired absence of kidney: Secondary | ICD-10-CM

## 2021-02-23 DIAGNOSIS — R42 Dizziness and giddiness: Secondary | ICD-10-CM

## 2021-02-23 DIAGNOSIS — Z0000 Encounter for general adult medical examination without abnormal findings: Secondary | ICD-10-CM

## 2021-02-23 DIAGNOSIS — L6612 Frontal fibrosing alopecia: Secondary | ICD-10-CM

## 2021-02-23 DIAGNOSIS — Z136 Encounter for screening for cardiovascular disorders: Secondary | ICD-10-CM | POA: Insufficient documentation

## 2021-02-23 NOTE — Assessment & Plan Note (Signed)
Chronic A1c in the normal range Low sugar / carb diet Stressed regular exercise

## 2021-02-23 NOTE — Assessment & Plan Note (Addendum)
Chronic Following with nephrology GFR stable 

## 2021-02-23 NOTE — Assessment & Plan Note (Addendum)
Chronic DEXA due-ordered Stressed regular exercise Continue calcium, vitamin D

## 2021-02-23 NOTE — Assessment & Plan Note (Addendum)
Chronic Somewhat controlled-getting about 6 and half hours of sleep at night, which is less than ideal.  Also she has a little bit of a hangover type feeling after taking trazodone 50 mg nightly, which works better than 25 mg Continue trazodone 25-50 mg nightly as needed We discussed other options and the pros and cons of some of them-she will look into other options and let me know if she wants to try something different

## 2021-02-23 NOTE — Assessment & Plan Note (Signed)
Acute Has history of dizziness in the past, but recently has been having episodic dizziness Has been diagnosed with a pineal cyst years ago-given increased dizziness recently without obvious pattern will get an MRI to evaluate pineal cyst again

## 2021-02-23 NOTE — Assessment & Plan Note (Addendum)
Chronic Regular exercise and healthy diet encouraged Lipids controlled Continue control with lifestyle

## 2021-02-23 NOTE — Assessment & Plan Note (Signed)
She has a strong family history of heart disease and is concerned about her risk Cholesterol reasonably controlled with lifestyle I do not think she needs a statin Will get a CT coronary calcium score to evaluate risk Start regular exercise She plans on working on weight loss

## 2021-02-23 NOTE — Assessment & Plan Note (Signed)
Chronic Following with dermatology On finasteride 5 mg daily and minoxidil-we will discuss with oncology regarding finasteride and increased risk of breast cancer

## 2021-02-23 NOTE — Assessment & Plan Note (Signed)
Chronic Diagnosed years ago when she was evaluated for vertigo-we do not have the MRI and her chart Does have some intermittent dizziness/lightheadedness without obvious pattern.  No headaches or changes in vision Will order MRI to follow-up-she does not recall if she was supposed to have further evaluation or follow-up

## 2021-02-24 DIAGNOSIS — E785 Hyperlipidemia, unspecified: Secondary | ICD-10-CM | POA: Diagnosis not present

## 2021-02-24 DIAGNOSIS — N139 Obstructive and reflux uropathy, unspecified: Secondary | ICD-10-CM | POA: Diagnosis not present

## 2021-02-24 DIAGNOSIS — L661 Lichen planopilaris: Secondary | ICD-10-CM | POA: Diagnosis not present

## 2021-02-24 DIAGNOSIS — R944 Abnormal results of kidney function studies: Secondary | ICD-10-CM | POA: Diagnosis not present

## 2021-02-25 ENCOUNTER — Ambulatory Visit: Payer: 59 | Admitting: Hematology and Oncology

## 2021-03-01 DIAGNOSIS — J301 Allergic rhinitis due to pollen: Secondary | ICD-10-CM | POA: Diagnosis not present

## 2021-03-01 DIAGNOSIS — J3089 Other allergic rhinitis: Secondary | ICD-10-CM | POA: Diagnosis not present

## 2021-03-01 DIAGNOSIS — J3081 Allergic rhinitis due to animal (cat) (dog) hair and dander: Secondary | ICD-10-CM | POA: Diagnosis not present

## 2021-03-14 ENCOUNTER — Ambulatory Visit
Admission: RE | Admit: 2021-03-14 | Discharge: 2021-03-14 | Disposition: A | Discharge status: Home / Self Care | Payer: 59 | Source: Ambulatory Visit | Attending: Internal Medicine | Admitting: Internal Medicine

## 2021-03-14 ENCOUNTER — Other Ambulatory Visit: Payer: Self-pay

## 2021-03-14 DIAGNOSIS — R42 Dizziness and giddiness: Secondary | ICD-10-CM | POA: Diagnosis not present

## 2021-03-14 DIAGNOSIS — E348 Other specified endocrine disorders: Secondary | ICD-10-CM

## 2021-03-15 ENCOUNTER — Encounter: Payer: Self-pay | Admitting: Internal Medicine

## 2021-03-15 DIAGNOSIS — I639 Cerebral infarction, unspecified: Secondary | ICD-10-CM

## 2021-03-17 ENCOUNTER — Other Ambulatory Visit: Payer: Self-pay | Admitting: Internal Medicine

## 2021-03-17 ENCOUNTER — Other Ambulatory Visit (HOSPITAL_COMMUNITY): Payer: Self-pay

## 2021-03-20 NOTE — Addendum Note (Signed)
Addended by: Binnie Rail on: 03/20/2021 06:47 PM   Modules accepted: Orders

## 2021-03-21 DIAGNOSIS — J3089 Other allergic rhinitis: Secondary | ICD-10-CM | POA: Diagnosis not present

## 2021-03-21 DIAGNOSIS — J301 Allergic rhinitis due to pollen: Secondary | ICD-10-CM | POA: Diagnosis not present

## 2021-03-21 DIAGNOSIS — L661 Lichen planopilaris: Secondary | ICD-10-CM | POA: Diagnosis not present

## 2021-03-21 DIAGNOSIS — J3081 Allergic rhinitis due to animal (cat) (dog) hair and dander: Secondary | ICD-10-CM | POA: Diagnosis not present

## 2021-03-22 ENCOUNTER — Encounter: Payer: Self-pay | Admitting: Internal Medicine

## 2021-03-22 DIAGNOSIS — R739 Hyperglycemia, unspecified: Secondary | ICD-10-CM

## 2021-03-22 DIAGNOSIS — E7849 Other hyperlipidemia: Secondary | ICD-10-CM

## 2021-03-22 DIAGNOSIS — Z905 Acquired absence of kidney: Secondary | ICD-10-CM

## 2021-03-23 ENCOUNTER — Other Ambulatory Visit: Payer: Self-pay

## 2021-03-23 ENCOUNTER — Other Ambulatory Visit: Payer: 59

## 2021-03-23 MED ORDER — EPINEPHRINE 0.3 MG/0.3ML IJ SOAJ: 0.3000 mg | INTRAMUSCULAR | 2 refills | Status: DC | PRN

## 2021-03-24 ENCOUNTER — Other Ambulatory Visit (HOSPITAL_COMMUNITY): Payer: Self-pay

## 2021-03-24 MED ORDER — PIMECROLIMUS 1 % EX CREA
TOPICAL_CREAM | CUTANEOUS | 1 refills | Status: AC
  Filled 2021-03-24: qty 60, 30d supply, fill #0

## 2021-03-24 MED ORDER — FINASTERIDE 5 MG PO TABS
ORAL_TABLET | ORAL | 2 refills | Status: DC
  Filled 2021-03-24: qty 90, 90d supply, fill #0
  Filled 2021-07-10: qty 90, 90d supply, fill #1
  Filled 2021-10-02: qty 90, 90d supply, fill #2

## 2021-03-25 ENCOUNTER — Other Ambulatory Visit (HOSPITAL_COMMUNITY): Payer: Self-pay

## 2021-03-30 ENCOUNTER — Other Ambulatory Visit (HOSPITAL_COMMUNITY): Payer: Self-pay

## 2021-04-05 ENCOUNTER — Other Ambulatory Visit: Payer: Self-pay | Admitting: Internal Medicine

## 2021-04-05 ENCOUNTER — Other Ambulatory Visit (HOSPITAL_COMMUNITY): Payer: Self-pay

## 2021-04-05 MED ORDER — TRAZODONE HCL 50 MG PO TABS
75.0000 mg | ORAL_TABLET | Freq: Every day | ORAL | 2 refills | Status: DC
  Filled 2021-04-05: qty 180, 90d supply, fill #0
  Filled 2021-07-26: qty 180, 90d supply, fill #1
  Filled 2021-11-19: qty 180, 90d supply, fill #2

## 2021-04-05 MED ORDER — TRAZODONE HCL 50 MG PO TABS
ORAL_TABLET | Freq: Every evening | ORAL | 1 refills | Status: DC | PRN
  Filled 2021-04-05: qty 90, fill #0

## 2021-04-05 MED ORDER — EPINEPHRINE 0.3 MG/0.3ML IJ SOAJ
0.3000 mg | INTRAMUSCULAR | 2 refills | Status: AC | PRN
  Filled 2021-04-05: qty 2, 30d supply, fill #0

## 2021-04-06 ENCOUNTER — Other Ambulatory Visit (HOSPITAL_COMMUNITY): Payer: Self-pay

## 2021-04-07 NOTE — Addendum Note (Signed)
Addended by: Binnie Rail on: 04/07/2021 04:40 PM   Modules accepted: Orders

## 2021-04-08 ENCOUNTER — Other Ambulatory Visit (HOSPITAL_COMMUNITY): Payer: Self-pay

## 2021-04-08 DIAGNOSIS — J3089 Other allergic rhinitis: Secondary | ICD-10-CM | POA: Diagnosis not present

## 2021-04-08 DIAGNOSIS — J301 Allergic rhinitis due to pollen: Secondary | ICD-10-CM | POA: Diagnosis not present

## 2021-04-08 DIAGNOSIS — J3081 Allergic rhinitis due to animal (cat) (dog) hair and dander: Secondary | ICD-10-CM | POA: Diagnosis not present

## 2021-04-08 NOTE — Addendum Note (Signed)
Addended by: Binnie Rail on: 04/08/2021 09:05 AM   Modules accepted: Orders

## 2021-04-14 ENCOUNTER — Other Ambulatory Visit: Payer: Self-pay | Admitting: Internal Medicine

## 2021-04-15 ENCOUNTER — Ambulatory Visit
Admission: RE | Admit: 2021-04-15 | Discharge: 2021-04-15 | Disposition: A | Discharge status: Home / Self Care | Payer: No Typology Code available for payment source | Source: Ambulatory Visit | Attending: Internal Medicine | Admitting: Internal Medicine

## 2021-04-15 ENCOUNTER — Other Ambulatory Visit: Payer: Self-pay | Admitting: Internal Medicine

## 2021-04-15 DIAGNOSIS — I2510 Atherosclerotic heart disease of native coronary artery without angina pectoris: Secondary | ICD-10-CM

## 2021-04-17 ENCOUNTER — Encounter: Payer: Self-pay | Admitting: Internal Medicine

## 2021-04-17 DIAGNOSIS — R931 Abnormal findings on diagnostic imaging of heart and coronary circulation: Secondary | ICD-10-CM | POA: Insufficient documentation

## 2021-04-19 ENCOUNTER — Encounter: Payer: Self-pay | Admitting: Internal Medicine

## 2021-04-21 ENCOUNTER — Other Ambulatory Visit (HOSPITAL_COMMUNITY): Payer: Self-pay

## 2021-04-21 ENCOUNTER — Other Ambulatory Visit: Payer: Self-pay | Admitting: Internal Medicine

## 2021-04-28 DIAGNOSIS — J3089 Other allergic rhinitis: Secondary | ICD-10-CM | POA: Diagnosis not present

## 2021-04-28 DIAGNOSIS — J301 Allergic rhinitis due to pollen: Secondary | ICD-10-CM | POA: Diagnosis not present

## 2021-04-28 DIAGNOSIS — J3081 Allergic rhinitis due to animal (cat) (dog) hair and dander: Secondary | ICD-10-CM | POA: Diagnosis not present

## 2021-05-08 ENCOUNTER — Encounter: Payer: Self-pay | Admitting: Internal Medicine

## 2021-05-08 ENCOUNTER — Other Ambulatory Visit: Payer: Self-pay | Admitting: Internal Medicine

## 2021-05-09 ENCOUNTER — Other Ambulatory Visit (HOSPITAL_COMMUNITY): Payer: Self-pay

## 2021-05-09 ENCOUNTER — Other Ambulatory Visit: Payer: Self-pay | Admitting: Internal Medicine

## 2021-05-09 MED ORDER — ESTRADIOL 0.025 MG/24HR TD PTTW
MEDICATED_PATCH | TRANSDERMAL | 3 refills | Status: DC
  Filled 2021-05-09: qty 8, fill #0
  Filled 2021-05-12 – 2021-05-13 (×2): qty 8, 28d supply, fill #0

## 2021-05-12 ENCOUNTER — Other Ambulatory Visit (HOSPITAL_COMMUNITY): Payer: Self-pay

## 2021-05-13 ENCOUNTER — Other Ambulatory Visit: Payer: Self-pay

## 2021-05-13 ENCOUNTER — Other Ambulatory Visit (HOSPITAL_COMMUNITY): Payer: Self-pay

## 2021-05-13 ENCOUNTER — Encounter: Payer: Self-pay | Admitting: Internal Medicine

## 2021-05-13 DIAGNOSIS — J3089 Other allergic rhinitis: Secondary | ICD-10-CM | POA: Diagnosis not present

## 2021-05-13 DIAGNOSIS — J3081 Allergic rhinitis due to animal (cat) (dog) hair and dander: Secondary | ICD-10-CM | POA: Diagnosis not present

## 2021-05-13 DIAGNOSIS — J301 Allergic rhinitis due to pollen: Secondary | ICD-10-CM | POA: Diagnosis not present

## 2021-05-13 MED ORDER — ESTRADIOL 0.025 MG/24HR TD PTTW
MEDICATED_PATCH | TRANSDERMAL | 0 refills | Status: DC
  Filled 2021-05-13: qty 24, fill #0
  Filled 2021-06-14: qty 24, 84d supply, fill #0

## 2021-05-13 MED ORDER — ESTRADIOL 0.025 MG/24HR TD PTTW
MEDICATED_PATCH | TRANSDERMAL | 3 refills | Status: DC
  Filled 2021-05-13: qty 8, fill #0

## 2021-05-17 ENCOUNTER — Other Ambulatory Visit (HOSPITAL_COMMUNITY): Payer: Self-pay

## 2021-05-23 DIAGNOSIS — L570 Actinic keratosis: Secondary | ICD-10-CM | POA: Diagnosis not present

## 2021-05-23 DIAGNOSIS — D225 Melanocytic nevi of trunk: Secondary | ICD-10-CM | POA: Diagnosis not present

## 2021-05-23 DIAGNOSIS — D2272 Melanocytic nevi of left lower limb, including hip: Secondary | ICD-10-CM | POA: Diagnosis not present

## 2021-05-23 DIAGNOSIS — Z85828 Personal history of other malignant neoplasm of skin: Secondary | ICD-10-CM | POA: Diagnosis not present

## 2021-05-23 DIAGNOSIS — L821 Other seborrheic keratosis: Secondary | ICD-10-CM | POA: Diagnosis not present

## 2021-05-23 DIAGNOSIS — L812 Freckles: Secondary | ICD-10-CM | POA: Diagnosis not present

## 2021-05-23 DIAGNOSIS — D2371 Other benign neoplasm of skin of right lower limb, including hip: Secondary | ICD-10-CM | POA: Diagnosis not present

## 2021-05-23 DIAGNOSIS — D1801 Hemangioma of skin and subcutaneous tissue: Secondary | ICD-10-CM | POA: Diagnosis not present

## 2021-05-23 DIAGNOSIS — L578 Other skin changes due to chronic exposure to nonionizing radiation: Secondary | ICD-10-CM | POA: Diagnosis not present

## 2021-06-02 DIAGNOSIS — J3089 Other allergic rhinitis: Secondary | ICD-10-CM | POA: Diagnosis not present

## 2021-06-02 DIAGNOSIS — J3081 Allergic rhinitis due to animal (cat) (dog) hair and dander: Secondary | ICD-10-CM | POA: Diagnosis not present

## 2021-06-02 DIAGNOSIS — J301 Allergic rhinitis due to pollen: Secondary | ICD-10-CM | POA: Diagnosis not present

## 2021-06-14 ENCOUNTER — Other Ambulatory Visit (HOSPITAL_COMMUNITY): Payer: Self-pay

## 2021-06-14 ENCOUNTER — Encounter: Payer: Self-pay | Admitting: Internal Medicine

## 2021-06-14 MED ORDER — HYDROCORTISONE (PERIANAL) 2.5 % EX CREA
TOPICAL_CREAM | CUTANEOUS | 2 refills | Status: DC
  Filled 2021-06-14: qty 30, 15d supply, fill #0
  Filled 2021-10-02: qty 30, 15d supply, fill #1
  Filled 2021-12-29 – 2022-02-09 (×2): qty 30, 15d supply, fill #2

## 2021-06-22 DIAGNOSIS — J3089 Other allergic rhinitis: Secondary | ICD-10-CM | POA: Diagnosis not present

## 2021-06-22 DIAGNOSIS — J301 Allergic rhinitis due to pollen: Secondary | ICD-10-CM | POA: Diagnosis not present

## 2021-06-22 DIAGNOSIS — J3081 Allergic rhinitis due to animal (cat) (dog) hair and dander: Secondary | ICD-10-CM | POA: Diagnosis not present

## 2021-06-29 ENCOUNTER — Encounter: Payer: Self-pay | Admitting: Internal Medicine

## 2021-07-04 ENCOUNTER — Encounter: Payer: 59 | Attending: Internal Medicine | Admitting: Registered"

## 2021-07-04 ENCOUNTER — Encounter: Payer: Self-pay | Admitting: Registered"

## 2021-07-04 DIAGNOSIS — E7849 Other hyperlipidemia: Secondary | ICD-10-CM | POA: Insufficient documentation

## 2021-07-04 NOTE — Patient Instructions (Addendum)
Limit dairy products. Almond substitutes may be okay. ?Read labels for added phosphorus which is absorbed more completely  foods ?Consider cutting back on wine to help lower triglycerides ?Recipe ideas: ReportBrain.cz ?Check specific foods: DefMagazine.is food data base ? ?Sleep tip sheet - Sleepfoundation.org offers a 2-week program for better sleep ?Mindfulness 8 week course https://palousemindfulness.com/ ?Waking Up app ?

## 2021-07-04 NOTE — Progress Notes (Signed)
Medical Nutrition Therapy  Appointment Start time:  6389  Appointment End time:  1715  Primary concerns today: kidney health, maintain A1c, high cholesterol (TG), weight management  Referral diagnosis: R73.9 (ICD-10-CM) - Hyperglycemia E78.49 (ICD-10-CM) - Other hyperlipidemia Preferred learning style: no preference indicated Learning readiness: ready, change in progress  NUTRITION ASSESSMENT  Anthropometrics  Wt Readings from Last 3 Encounters:  02/23/21 157 lb 6.4 oz (71.4 kg)  02/26/20 152 lb 8 oz (69.2 kg)  11/25/19 148 lb 9.6 oz (67.4 kg)     Clinical Medical Hx: osteopenia, single kidney with reduced fnx, frontal fibrosing alopecia Medications: reviewed Labs: TG 159; A1c 5.5% (highest ~5.8 family history T2) Notable Signs/Symptoms: kidney lab work  Lifestyle & Dietary Hx Pt states she eats whole grains, fruits and vegetables.   Pt states since being retired does not have a strict schedule and doesn't feel sleepy until ~10 pm. Patient wakes up 3 am and stays awake ~2 hours, sleep pattern graduate school. Pt reports no naps during the day.  Estimated daily fluid intake: a lot of water, red wine 4x/week Supplements: calcium, fiber, b-complex occasionally, sam-e Sleep: 7 hrs (needs more than 7,  Stress / self-care: exercise, yoga, paint, Current average weekly physical activity: 4x/week 30-60 min  24-Hr Dietary Recall First Meal: greek yogurt 1 1/2 c blue berries T honey, 1 T olive oil, cup of coffee Snack: apple maybe Second Meal: large salad, organic produce, feta Snack:  Third Meal: chicken, salad, a little bit of rice Snack: none Beverages: water, 1 c coffee with half n half, red wine  NUTRITION DIAGNOSIS  NB-1.1 Food and nutrition-related knowledge deficit As related to role of sleep in overall health and malleability of changing patterns.  As evidenced by Pt states sleep long term pattern .  NUTRITION INTERVENTION  Nutrition education (E-1) on the following  topics:  Foods to pay attention to considering reduced kidney fnx Specific amounts of foods can be addressed more specifically by renal dietitian working with nephrologist and access to labs. Sleep role in stress management Mindfulness and meditation Balanced eating for good blood sugar control Role of red wine in high triglycerides  Handouts Provided Include  Sleep Hygiene MyPlate Placemat for Kidney   Learning Style & Readiness for Change Teaching method utilized: Visual & Auditory  Demonstrated degree of understanding via: Teach Back  Barriers to learning/adherence to lifestyle change: none  Goals Established by Pt Limit dairy products. Almond substitutes may be okay. Read labels for added phosphorus which is absorbed more completely  foods Consider cutting back on wine to help lower triglycerides Recipe ideas: ReportBrain.cz Check specific foods: DefMagazine.is food data base  Sleep tip sheet - Sleepfoundation.org offers a 2-week program for better sleep Mindfulness 8 week course https://palousemindfulness.com/ Waking Up app   MONITORING & EVALUATION Dietary intake, weekly physical activity, and sleep prn.

## 2021-07-11 ENCOUNTER — Other Ambulatory Visit (HOSPITAL_COMMUNITY): Payer: Self-pay

## 2021-07-12 DIAGNOSIS — J3089 Other allergic rhinitis: Secondary | ICD-10-CM | POA: Diagnosis not present

## 2021-07-12 DIAGNOSIS — J301 Allergic rhinitis due to pollen: Secondary | ICD-10-CM | POA: Diagnosis not present

## 2021-07-12 DIAGNOSIS — J3081 Allergic rhinitis due to animal (cat) (dog) hair and dander: Secondary | ICD-10-CM | POA: Diagnosis not present

## 2021-07-26 ENCOUNTER — Other Ambulatory Visit (HOSPITAL_COMMUNITY): Payer: Self-pay

## 2021-08-02 DIAGNOSIS — J3081 Allergic rhinitis due to animal (cat) (dog) hair and dander: Secondary | ICD-10-CM | POA: Diagnosis not present

## 2021-08-02 DIAGNOSIS — J301 Allergic rhinitis due to pollen: Secondary | ICD-10-CM | POA: Diagnosis not present

## 2021-08-02 DIAGNOSIS — J3089 Other allergic rhinitis: Secondary | ICD-10-CM | POA: Diagnosis not present

## 2021-08-22 ENCOUNTER — Other Ambulatory Visit (HOSPITAL_COMMUNITY): Payer: Self-pay

## 2021-08-22 ENCOUNTER — Ambulatory Visit
Admission: RE | Admit: 2021-08-22 | Discharge: 2021-08-22 | Disposition: A | Discharge status: Home / Self Care | Payer: 59 | Source: Ambulatory Visit | Attending: Urgent Care | Admitting: Urgent Care

## 2021-08-22 VITALS — BP 130/91 | HR 79 | Temp 98.3°F | Resp 20

## 2021-08-22 DIAGNOSIS — J309 Allergic rhinitis, unspecified: Secondary | ICD-10-CM | POA: Insufficient documentation

## 2021-08-22 DIAGNOSIS — R0982 Postnasal drip: Secondary | ICD-10-CM | POA: Diagnosis not present

## 2021-08-22 DIAGNOSIS — R070 Pain in throat: Secondary | ICD-10-CM | POA: Insufficient documentation

## 2021-08-22 LAB — POCT RAPID STREP A (OFFICE): Rapid Strep A Screen: NEGATIVE

## 2021-08-22 MED ORDER — CETIRIZINE HCL 10 MG PO TABS
10.0000 mg | ORAL_TABLET | Freq: Every day | ORAL | 3 refills | Status: DC
  Filled 2021-08-22 – 2021-10-02 (×2): qty 90, 90d supply, fill #0
  Filled 2021-12-29: qty 90, 90d supply, fill #1
  Filled 2022-02-09 – 2022-05-14 (×2): qty 90, 90d supply, fill #2
  Filled 2022-08-06: qty 90, 90d supply, fill #3

## 2021-08-22 MED ORDER — LEVOCETIRIZINE DIHYDROCHLORIDE 5 MG PO TABS
5.0000 mg | ORAL_TABLET | Freq: Every evening | ORAL | 3 refills | Status: DC
  Filled 2021-08-22: qty 90, 90d supply, fill #0

## 2021-08-22 MED ORDER — FLUTICASONE PROPIONATE 50 MCG/ACT NA SUSP
2.0000 | Freq: Every day | NASAL | 0 refills | Status: DC
  Filled 2021-08-22: qty 16, 30d supply, fill #0

## 2021-08-22 MED ORDER — PSEUDOEPHEDRINE HCL 30 MG PO TABS
30.0000 mg | ORAL_TABLET | Freq: Two times a day (BID) | ORAL | 0 refills | Status: DC | PRN
  Filled 2021-08-22: qty 24, 12d supply, fill #0

## 2021-08-22 NOTE — Discharge Instructions (Addendum)
Take Zyrtec and Flonase daily. Use pseudoephedrine daily as needed. We'll let you know about your strep culture when it results.

## 2021-08-22 NOTE — ED Provider Notes (Signed)
Maria Kane   MRN: [id number] DOB: 12/25/1955  Subjective:   Maria Kane is a 66 y.o. female presenting for 2-week history of persistent throat pain, painful swallowing worse at night.  Has a history of allergic rhinitis and has been taking Claritin for years.  No overt runny or stuffy nose, ear pain, cough, chest pain, shortness of breath.  Patient does have a single kidney.  Reports that her GFR is greater than 60 but tries to avoid medications that would affect her kidneys.  No current facility-administered medications for this encounter.  Current Outpatient Medications:    CALCIUM PO, Take by mouth., Disp: , Rfl:    EPINEPHrine 0.3 mg/0.3 mL IJ SOAJ injection, Inject 0.3 mg into the muscle as needed for anaphylaxis., Disp: 1 each, Rfl: 2   estradiol (VIVELLE-DOT) 0.025 MG/24HR, PLACE 1 PATCH ONTO THE SKIN 2 TIMES A WEEK., Disp: 24 patch, Rfl: 0   finasteride (PROSCAR) 5 MG tablet, Take 1 tablet (5 mg total) by mouth once daily, Disp: 90 tablet, Rfl: 2   hydrocortisone (PROCTOZONE-HC) 2.5 % rectal cream, APPLY RECTALLY 2 TIMES DAILY IF NEEDED, Disp: 28 g, Rfl: 2   Multiple Vitamin (MULTIVITAMIN) tablet, Take 1 tablet by mouth daily., Disp: , Rfl:    pimecrolimus (ELIDEL) 1 % cream, Apply to affected area as directed., Disp: 60 g, Rfl: 1   Probiotic Product (PROBIOTIC DAILY PO), Take by mouth. (Patient not taking: Reported on 07/04/2021), Disp: , Rfl:    S-Adenosylmethionine (SAM-E PO), Take by mouth., Disp: , Rfl:    traZODone (DESYREL) 50 MG tablet, Take 1&1/2 to 2 tablets by mouth at bedtime., Disp: 180 tablet, Rfl: 2   Allergies  Allergen Reactions   Anesthetics, Amide     Has trouble waking up   Clarithromycin Nausea And Vomiting   Trazodone And Nefazodone Diarrhea    Past Medical History:  Diagnosis Date   Allergic rhinitis    dogs, dust, tree and ragweed   Family history of breast cancer    Family history of colon cancer    Family history of  melanoma    Family history of ovarian cancer    Heart palpitations 10/13/2013   History of chickenpox    History of gallstones    Osteopenia    dexa 11/09 -1.4 spine, -1.2 hip   Pineal gland cyst    incidental finding   Primiparous    Single kidney    left removed cong UPJ obstruction;functioning at 86 %     Past Surgical History:  Procedure Laterality Date   ABDOMINAL HYSTERECTOMY  1996   age 44 total severe endometriosis   APPENDECTOMY  2006   CHOLECYSTECTOMY  2001   COLONOSCOPY  04/01/2008   left kidney  2002   removed congential UPJ obstruction    TONSILLECTOMY  1965    Family History  Problem Relation Age of Onset   Breast cancer Mother    Osteoarthritis Mother    Multiple sclerosis Mother    Hypertension Father    Lung cancer Father    Stroke Father    Heart disease Father        pacemaker   Diabetes Sister    Hypertension Sister    Polymyositis Sister    Diabetes Brother    Hypertension Brother    Colon cancer Maternal Aunt        70's?   Colon polyps Neg Hx    Esophageal cancer Neg Hx  Stomach cancer Neg Hx    Rectal cancer Neg Hx     Social History   Tobacco Use   Smoking status: Never   Smokeless tobacco: Never  Vaping Use   Vaping Use: Never used  Substance Use Topics   Alcohol use: Yes    Alcohol/week: 9.0 - 12.0 standard drinks of alcohol    Types: 9 - 12 Glasses of wine per week   Drug use: No    ROS   Objective:   Vitals: BP (!) 130/91   Pulse 79   Temp 98.3 F (36.8 C)   Resp 20   SpO2 98%   Physical Exam Constitutional:      General: She is not in acute distress.    Appearance: Normal appearance. She is well-developed. She is not ill-appearing, toxic-appearing or diaphoretic.  HENT:     Head: Normocephalic and atraumatic.     Nose: Nose normal.     Mouth/Throat:     Mouth: Mucous membranes are moist.     Pharynx: No pharyngeal swelling, oropharyngeal exudate, posterior oropharyngeal erythema or uvula swelling.      Tonsils: No tonsillar exudate or tonsillar abscesses. 0 on the right. 0 on the left.  Eyes:     General: No scleral icterus.       Right eye: No discharge.        Left eye: No discharge.     Extraocular Movements: Extraocular movements intact.  Cardiovascular:     Rate and Rhythm: Normal rate.  Pulmonary:     Effort: Pulmonary effort is normal.  Skin:    General: Skin is warm and dry.  Neurological:     General: No focal deficit present.     Mental Status: She is alert and oriented to person, place, and time.  Psychiatric:        Mood and Affect: Mood normal.        Behavior: Behavior normal.     Results for orders placed or performed during the hospital encounter of 08/22/21 (from the past 24 hour(s))  POCT rapid strep A     Status: Normal   Collection Time: 08/22/21  2:57 PM  Result Value Ref Range   Rapid Strep A Screen Negative Negative    Assessment and Plan :   PDMP not reviewed this encounter.  1. Throat pain   2. Post-nasal drainage   3. Allergic rhinitis, unspecified seasonality, unspecified trigger    Strep culture pending.  Recommended general management for postnasal drainage and allergic rhinitis is the primary source of her throat pain.  Ultimately we landed on Zyrtec, Flonase and pseudoephedrine.  Patient declined oral steroids for now. Counseled patient on potential for adverse effects with medications prescribed/recommended today, ER and return-to-clinic precautions discussed, patient verbalized understanding.    Jaynee Eagles, [place] 08/22/21 1552

## 2021-08-22 NOTE — ED Triage Notes (Signed)
Pt here with sore throat x 2 weeks

## 2021-08-24 DIAGNOSIS — J3081 Allergic rhinitis due to animal (cat) (dog) hair and dander: Secondary | ICD-10-CM | POA: Diagnosis not present

## 2021-08-24 DIAGNOSIS — J301 Allergic rhinitis due to pollen: Secondary | ICD-10-CM | POA: Diagnosis not present

## 2021-08-24 DIAGNOSIS — J3089 Other allergic rhinitis: Secondary | ICD-10-CM | POA: Diagnosis not present

## 2021-08-25 LAB — CULTURE, GROUP A STREP (THRC)

## 2021-09-01 ENCOUNTER — Other Ambulatory Visit: Payer: Self-pay | Admitting: Internal Medicine

## 2021-09-02 ENCOUNTER — Other Ambulatory Visit (HOSPITAL_COMMUNITY): Payer: Self-pay

## 2021-09-02 MED ORDER — ESTRADIOL 0.025 MG/24HR TD PTTW
MEDICATED_PATCH | TRANSDERMAL | 0 refills | Status: DC
  Filled 2021-09-02: qty 24, 84d supply, fill #0

## 2021-09-09 DIAGNOSIS — J3081 Allergic rhinitis due to animal (cat) (dog) hair and dander: Secondary | ICD-10-CM | POA: Diagnosis not present

## 2021-09-09 DIAGNOSIS — J301 Allergic rhinitis due to pollen: Secondary | ICD-10-CM | POA: Diagnosis not present

## 2021-09-09 DIAGNOSIS — J3089 Other allergic rhinitis: Secondary | ICD-10-CM | POA: Diagnosis not present

## 2021-09-14 DIAGNOSIS — J301 Allergic rhinitis due to pollen: Secondary | ICD-10-CM | POA: Diagnosis not present

## 2021-09-15 DIAGNOSIS — J3081 Allergic rhinitis due to animal (cat) (dog) hair and dander: Secondary | ICD-10-CM | POA: Diagnosis not present

## 2021-09-15 DIAGNOSIS — J3089 Other allergic rhinitis: Secondary | ICD-10-CM | POA: Diagnosis not present

## 2021-09-29 DIAGNOSIS — J3089 Other allergic rhinitis: Secondary | ICD-10-CM | POA: Diagnosis not present

## 2021-09-29 DIAGNOSIS — J3081 Allergic rhinitis due to animal (cat) (dog) hair and dander: Secondary | ICD-10-CM | POA: Diagnosis not present

## 2021-09-29 DIAGNOSIS — J301 Allergic rhinitis due to pollen: Secondary | ICD-10-CM | POA: Diagnosis not present

## 2021-10-03 ENCOUNTER — Other Ambulatory Visit (HOSPITAL_COMMUNITY): Payer: Self-pay

## 2021-10-03 ENCOUNTER — Encounter: Payer: Self-pay | Admitting: Internal Medicine

## 2021-10-03 ENCOUNTER — Other Ambulatory Visit: Payer: Self-pay

## 2021-10-03 MED ORDER — HYDROCORTISONE ACETATE 25 MG RE SUPP
RECTAL | 1 refills | Status: AC
  Filled 2021-10-03: qty 50, 25d supply, fill #0
  Filled 2021-10-03: qty 10, 5d supply, fill #0
  Filled 2022-02-09 – 2022-02-21 (×2): qty 60, 30d supply, fill #1

## 2021-10-19 DIAGNOSIS — J3089 Other allergic rhinitis: Secondary | ICD-10-CM | POA: Diagnosis not present

## 2021-10-19 DIAGNOSIS — J301 Allergic rhinitis due to pollen: Secondary | ICD-10-CM | POA: Diagnosis not present

## 2021-10-19 DIAGNOSIS — J3081 Allergic rhinitis due to animal (cat) (dog) hair and dander: Secondary | ICD-10-CM | POA: Diagnosis not present

## 2021-11-08 DIAGNOSIS — J3081 Allergic rhinitis due to animal (cat) (dog) hair and dander: Secondary | ICD-10-CM | POA: Diagnosis not present

## 2021-11-08 DIAGNOSIS — J3089 Other allergic rhinitis: Secondary | ICD-10-CM | POA: Diagnosis not present

## 2021-11-08 DIAGNOSIS — J301 Allergic rhinitis due to pollen: Secondary | ICD-10-CM | POA: Diagnosis not present

## 2021-11-19 ENCOUNTER — Other Ambulatory Visit (HOSPITAL_COMMUNITY): Payer: Self-pay

## 2021-11-19 ENCOUNTER — Encounter: Payer: Self-pay | Admitting: Internal Medicine

## 2021-11-19 ENCOUNTER — Other Ambulatory Visit: Payer: Self-pay | Admitting: Internal Medicine

## 2021-11-20 MED ORDER — ESTRADIOL 0.025 MG/24HR TD PTTW
MEDICATED_PATCH | TRANSDERMAL | 0 refills | Status: DC
  Filled 2021-11-20: qty 24, 84d supply, fill #0

## 2021-11-21 ENCOUNTER — Other Ambulatory Visit (HOSPITAL_COMMUNITY): Payer: Self-pay

## 2021-11-21 DIAGNOSIS — J301 Allergic rhinitis due to pollen: Secondary | ICD-10-CM | POA: Insufficient documentation

## 2021-11-21 DIAGNOSIS — J3081 Allergic rhinitis due to animal (cat) (dog) hair and dander: Secondary | ICD-10-CM | POA: Insufficient documentation

## 2021-11-21 DIAGNOSIS — L661 Lichen planopilaris: Secondary | ICD-10-CM | POA: Diagnosis not present

## 2021-11-22 ENCOUNTER — Other Ambulatory Visit (HOSPITAL_COMMUNITY): Payer: Self-pay

## 2021-11-22 DIAGNOSIS — L819 Disorder of pigmentation, unspecified: Secondary | ICD-10-CM | POA: Insufficient documentation

## 2021-11-22 MED ORDER — TRETINOIN 0.05 % EX CREA
TOPICAL_CREAM | CUTANEOUS | 1 refills | Status: DC
  Filled 2021-11-22: qty 45, 30d supply, fill #0

## 2021-11-22 MED ORDER — METRONIDAZOLE 0.75 % EX GEL
CUTANEOUS | 1 refills | Status: DC
  Filled 2021-11-22: qty 45, 30d supply, fill #0

## 2021-11-29 DIAGNOSIS — J3081 Allergic rhinitis due to animal (cat) (dog) hair and dander: Secondary | ICD-10-CM | POA: Diagnosis not present

## 2021-11-29 DIAGNOSIS — J301 Allergic rhinitis due to pollen: Secondary | ICD-10-CM | POA: Diagnosis not present

## 2021-11-29 DIAGNOSIS — J3089 Other allergic rhinitis: Secondary | ICD-10-CM | POA: Diagnosis not present

## 2021-12-06 DIAGNOSIS — J301 Allergic rhinitis due to pollen: Secondary | ICD-10-CM | POA: Diagnosis not present

## 2021-12-06 DIAGNOSIS — J3089 Other allergic rhinitis: Secondary | ICD-10-CM | POA: Diagnosis not present

## 2021-12-06 DIAGNOSIS — J3081 Allergic rhinitis due to animal (cat) (dog) hair and dander: Secondary | ICD-10-CM | POA: Diagnosis not present

## 2021-12-08 ENCOUNTER — Encounter: Payer: Self-pay | Admitting: Internal Medicine

## 2021-12-13 ENCOUNTER — Encounter: Payer: Self-pay | Admitting: Internal Medicine

## 2021-12-13 DIAGNOSIS — H524 Presbyopia: Secondary | ICD-10-CM | POA: Diagnosis not present

## 2021-12-13 DIAGNOSIS — J301 Allergic rhinitis due to pollen: Secondary | ICD-10-CM | POA: Diagnosis not present

## 2021-12-13 DIAGNOSIS — J3081 Allergic rhinitis due to animal (cat) (dog) hair and dander: Secondary | ICD-10-CM | POA: Diagnosis not present

## 2021-12-13 DIAGNOSIS — J3089 Other allergic rhinitis: Secondary | ICD-10-CM | POA: Diagnosis not present

## 2021-12-19 NOTE — Progress Notes (Unsigned)
    Subjective:    Patient ID: Maria Kane, female    DOB: 03/15/1955, 66 y.o.   MRN: [id number]      HPI Maria Kane is here for No chief complaint on file.   Last year SCC skin cancer removed from lower leg.  Prior to removal she had sharp pains in that area - running up and down leg.  Recently - started having the pain again - less intense.       Medications and allergies reviewed with patient and updated if appropriate.  Current Outpatient Medications on File Prior to Visit  Medication Sig Dispense Refill   CALCIUM PO Take by mouth.     cetirizine (ZYRTEC ALLERGY) 10 MG tablet Take 1 tablet (10 mg total) by mouth daily. 90 tablet 3   EPINEPHrine 0.3 mg/0.3 mL IJ SOAJ injection Inject 0.3 mg into the muscle as needed for anaphylaxis. 1 each 2   estradiol (VIVELLE-DOT) 0.025 MG/24HR PLACE 1 PATCH ONTO THE SKIN 2 TIMES A WEEK. 24 patch 0   finasteride (PROSCAR) 5 MG tablet Take 1 tablet (5 mg total) by mouth once daily 90 tablet 2   fluticasone (FLONASE) 50 MCG/ACT nasal spray Place 2 sprays into both nostrils daily. 16 g 0   hydrocortisone (ANUSOL-HC) 25 MG suppository UNWRAP AND INSERT 1 SUPPOSITORY RECTALLY TWO TIMES DAILY AS NEEDED FOR HEMORRHOIDS OR ITCHING 60 suppository 1   hydrocortisone (PROCTOZONE-HC) 2.5 % rectal cream APPLY RECTALLY 2 TIMES DAILY IF NEEDED 28 g 2   metroNIDAZOLE (METROGEL) 0.75 % gel Apply daily to affected area of face 45 g 1   Multiple Vitamin (MULTIVITAMIN) tablet Take 1 tablet by mouth daily.     pimecrolimus (ELIDEL) 1 % cream Apply to affected area as directed. 60 g 1   Probiotic Product (PROBIOTIC DAILY PO) Take by mouth. (Patient not taking: Reported on 07/04/2021)     pseudoephedrine (SUDAFED) 30 MG tablet Take 1 tablet (30 mg total) by mouth 2 (two) times daily as needed for congestion. 30 tablet 0   S-Adenosylmethionine (SAM-E PO) Take by mouth.     traZODone (DESYREL) 50 MG tablet Take 1&1/2 to 2 tablets by mouth at bedtime. 180 tablet 2    tretinoin (RETIN-A) 0.05 % cream Apply thin film to hyperpigmented areas at night 45 g 1   No current facility-administered medications on file prior to visit.    Review of Systems     Objective:  There were no vitals filed for this visit. BP Readings from Last 3 Encounters:  08/22/21 (!) 130/91  02/23/21 112/78  02/26/20 104/75   Wt Readings from Last 3 Encounters:  02/23/21 157 lb 6.4 oz (71.4 kg)  02/26/20 152 lb 8 oz (69.2 kg)  11/25/19 148 lb 9.6 oz (67.4 kg)   There is no height or weight on file to calculate BMI.    Physical Exam         Assessment & Plan:    See Problem List for Assessment and Plan of chronic medical problems.

## 2021-12-20 ENCOUNTER — Ambulatory Visit: Payer: 59 | Admitting: Internal Medicine

## 2021-12-20 ENCOUNTER — Encounter: Payer: Self-pay | Admitting: Internal Medicine

## 2021-12-20 VITALS — BP 118/80 | HR 73 | Temp 97.9°F | Ht 65.0 in | Wt 151.0 lb

## 2021-12-20 DIAGNOSIS — J301 Allergic rhinitis due to pollen: Secondary | ICD-10-CM | POA: Diagnosis not present

## 2021-12-20 DIAGNOSIS — M79604 Pain in right leg: Secondary | ICD-10-CM | POA: Diagnosis not present

## 2021-12-20 DIAGNOSIS — J3089 Other allergic rhinitis: Secondary | ICD-10-CM | POA: Diagnosis not present

## 2021-12-20 DIAGNOSIS — J3081 Allergic rhinitis due to animal (cat) (dog) hair and dander: Secondary | ICD-10-CM | POA: Diagnosis not present

## 2021-12-20 NOTE — Assessment & Plan Note (Signed)
New History of squamous cell carcinoma of the skin removed from right lower lateral leg last year-prior to removal she had pain in that area and 3 weeks ago started having pain similar to that again Also some numbness, tingling and weird feeling Mild tenderness in the area, no calf tenderness, no skin changes or change in sensation She would like to have further evaluation-we will order a CT scan without contrast since she only has 1 kidney and see if that can evaluate the area Further evaluation depending on results

## 2021-12-20 NOTE — Patient Instructions (Signed)
      Medications changes include :   none    A ct scan was ordered.

## 2021-12-27 DIAGNOSIS — J301 Allergic rhinitis due to pollen: Secondary | ICD-10-CM | POA: Diagnosis not present

## 2021-12-27 DIAGNOSIS — J3081 Allergic rhinitis due to animal (cat) (dog) hair and dander: Secondary | ICD-10-CM | POA: Diagnosis not present

## 2021-12-27 DIAGNOSIS — J3089 Other allergic rhinitis: Secondary | ICD-10-CM | POA: Diagnosis not present

## 2021-12-29 ENCOUNTER — Other Ambulatory Visit (HOSPITAL_COMMUNITY): Payer: Self-pay

## 2021-12-29 ENCOUNTER — Encounter: Payer: Self-pay | Admitting: Internal Medicine

## 2021-12-30 ENCOUNTER — Other Ambulatory Visit: Payer: Self-pay

## 2021-12-30 DIAGNOSIS — Z803 Family history of malignant neoplasm of breast: Secondary | ICD-10-CM

## 2021-12-31 ENCOUNTER — Other Ambulatory Visit: Payer: Self-pay | Admitting: Internal Medicine

## 2021-12-31 DIAGNOSIS — M79604 Pain in right leg: Secondary | ICD-10-CM

## 2021-12-31 NOTE — Progress Notes (Signed)
CT re-ordered

## 2022-01-17 DIAGNOSIS — J301 Allergic rhinitis due to pollen: Secondary | ICD-10-CM | POA: Diagnosis not present

## 2022-01-17 DIAGNOSIS — J3089 Other allergic rhinitis: Secondary | ICD-10-CM | POA: Diagnosis not present

## 2022-01-17 DIAGNOSIS — J3081 Allergic rhinitis due to animal (cat) (dog) hair and dander: Secondary | ICD-10-CM | POA: Diagnosis not present

## 2022-01-26 DIAGNOSIS — Z780 Asymptomatic menopausal state: Secondary | ICD-10-CM | POA: Diagnosis not present

## 2022-01-26 DIAGNOSIS — M8589 Other specified disorders of bone density and structure, multiple sites: Secondary | ICD-10-CM | POA: Diagnosis not present

## 2022-01-26 LAB — HM MAMMOGRAPHY

## 2022-01-26 LAB — HM DEXA SCAN

## 2022-02-03 DIAGNOSIS — R052 Subacute cough: Secondary | ICD-10-CM | POA: Diagnosis not present

## 2022-02-03 DIAGNOSIS — H1045 Other chronic allergic conjunctivitis: Secondary | ICD-10-CM | POA: Diagnosis not present

## 2022-02-03 DIAGNOSIS — J301 Allergic rhinitis due to pollen: Secondary | ICD-10-CM | POA: Diagnosis not present

## 2022-02-03 DIAGNOSIS — J3089 Other allergic rhinitis: Secondary | ICD-10-CM | POA: Diagnosis not present

## 2022-02-03 DIAGNOSIS — J3081 Allergic rhinitis due to animal (cat) (dog) hair and dander: Secondary | ICD-10-CM | POA: Diagnosis not present

## 2022-02-09 ENCOUNTER — Other Ambulatory Visit: Payer: Self-pay | Admitting: Internal Medicine

## 2022-02-09 ENCOUNTER — Encounter: Payer: Self-pay | Admitting: Internal Medicine

## 2022-02-09 ENCOUNTER — Other Ambulatory Visit: Payer: Self-pay

## 2022-02-09 ENCOUNTER — Other Ambulatory Visit (HOSPITAL_COMMUNITY): Payer: Self-pay

## 2022-02-09 MED ORDER — ESTRADIOL 0.025 MG/24HR TD PTTW
MEDICATED_PATCH | TRANSDERMAL | 0 refills | Status: DC
  Filled 2022-02-09 – 2022-02-21 (×2): qty 24, 84d supply, fill #0

## 2022-02-09 MED ORDER — HYDROCORTISONE (PERIANAL) 2.5 % EX CREA
1.0000 | TOPICAL_CREAM | Freq: Two times a day (BID) | CUTANEOUS | 2 refills | Status: DC | PRN
  Filled 2022-02-09 – 2022-02-21 (×2): qty 30, 10d supply, fill #0
  Filled 2022-08-06: qty 30, 10d supply, fill #1

## 2022-02-10 ENCOUNTER — Encounter (HOSPITAL_COMMUNITY): Payer: Self-pay

## 2022-02-10 ENCOUNTER — Other Ambulatory Visit (HOSPITAL_COMMUNITY): Payer: Self-pay

## 2022-02-15 ENCOUNTER — Ambulatory Visit
Admission: RE | Admit: 2022-02-15 | Discharge: 2022-02-15 | Disposition: A | Discharge status: Home / Self Care | Payer: 59 | Source: Ambulatory Visit | Attending: Internal Medicine | Admitting: Internal Medicine

## 2022-02-15 DIAGNOSIS — M79604 Pain in right leg: Secondary | ICD-10-CM

## 2022-02-16 ENCOUNTER — Encounter (HOSPITAL_COMMUNITY): Payer: Self-pay

## 2022-02-16 ENCOUNTER — Other Ambulatory Visit (HOSPITAL_COMMUNITY): Payer: Self-pay

## 2022-02-17 ENCOUNTER — Other Ambulatory Visit: Payer: Self-pay

## 2022-02-17 ENCOUNTER — Encounter: Payer: Self-pay | Admitting: Internal Medicine

## 2022-02-17 ENCOUNTER — Other Ambulatory Visit (HOSPITAL_COMMUNITY): Payer: Self-pay

## 2022-02-17 NOTE — Progress Notes (Signed)
Outside notes received. Information abstracted. Notes sent to scan.  

## 2022-02-18 ENCOUNTER — Encounter: Payer: Self-pay | Admitting: Internal Medicine

## 2022-02-21 ENCOUNTER — Other Ambulatory Visit: Payer: Self-pay

## 2022-02-21 ENCOUNTER — Other Ambulatory Visit (HOSPITAL_COMMUNITY): Payer: Self-pay

## 2022-02-21 DIAGNOSIS — R7989 Other specified abnormal findings of blood chemistry: Secondary | ICD-10-CM | POA: Diagnosis not present

## 2022-02-23 DIAGNOSIS — J3081 Allergic rhinitis due to animal (cat) (dog) hair and dander: Secondary | ICD-10-CM | POA: Diagnosis not present

## 2022-02-23 DIAGNOSIS — J3089 Other allergic rhinitis: Secondary | ICD-10-CM | POA: Diagnosis not present

## 2022-02-23 DIAGNOSIS — J301 Allergic rhinitis due to pollen: Secondary | ICD-10-CM | POA: Diagnosis not present

## 2022-02-24 ENCOUNTER — Other Ambulatory Visit: Payer: Self-pay

## 2022-02-24 ENCOUNTER — Other Ambulatory Visit (HOSPITAL_COMMUNITY): Payer: Self-pay

## 2022-02-27 DIAGNOSIS — N139 Obstructive and reflux uropathy, unspecified: Secondary | ICD-10-CM | POA: Diagnosis not present

## 2022-02-27 DIAGNOSIS — E785 Hyperlipidemia, unspecified: Secondary | ICD-10-CM | POA: Diagnosis not present

## 2022-02-27 DIAGNOSIS — L661 Lichen planopilaris: Secondary | ICD-10-CM | POA: Diagnosis not present

## 2022-02-27 DIAGNOSIS — R944 Abnormal results of kidney function studies: Secondary | ICD-10-CM | POA: Diagnosis not present

## 2022-03-01 ENCOUNTER — Encounter: Payer: Self-pay | Admitting: Internal Medicine

## 2022-03-02 ENCOUNTER — Other Ambulatory Visit: Payer: Self-pay

## 2022-03-02 DIAGNOSIS — J301 Allergic rhinitis due to pollen: Secondary | ICD-10-CM | POA: Diagnosis not present

## 2022-03-02 DIAGNOSIS — J3081 Allergic rhinitis due to animal (cat) (dog) hair and dander: Secondary | ICD-10-CM | POA: Diagnosis not present

## 2022-03-02 DIAGNOSIS — J3089 Other allergic rhinitis: Secondary | ICD-10-CM | POA: Diagnosis not present

## 2022-03-08 DIAGNOSIS — J3089 Other allergic rhinitis: Secondary | ICD-10-CM | POA: Diagnosis not present

## 2022-03-08 DIAGNOSIS — J3081 Allergic rhinitis due to animal (cat) (dog) hair and dander: Secondary | ICD-10-CM | POA: Diagnosis not present

## 2022-03-08 DIAGNOSIS — J301 Allergic rhinitis due to pollen: Secondary | ICD-10-CM | POA: Diagnosis not present

## 2022-03-09 ENCOUNTER — Encounter: Payer: Self-pay | Admitting: Internal Medicine

## 2022-03-15 DIAGNOSIS — J3089 Other allergic rhinitis: Secondary | ICD-10-CM | POA: Diagnosis not present

## 2022-03-15 DIAGNOSIS — J3081 Allergic rhinitis due to animal (cat) (dog) hair and dander: Secondary | ICD-10-CM | POA: Diagnosis not present

## 2022-03-15 DIAGNOSIS — J301 Allergic rhinitis due to pollen: Secondary | ICD-10-CM | POA: Diagnosis not present

## 2022-03-26 ENCOUNTER — Other Ambulatory Visit: Payer: Self-pay | Admitting: Internal Medicine

## 2022-03-29 ENCOUNTER — Other Ambulatory Visit (HOSPITAL_COMMUNITY): Payer: Self-pay

## 2022-03-29 ENCOUNTER — Telehealth: Payer: Self-pay | Admitting: Internal Medicine

## 2022-03-29 DIAGNOSIS — R739 Hyperglycemia, unspecified: Secondary | ICD-10-CM

## 2022-03-29 DIAGNOSIS — M85861 Other specified disorders of bone density and structure, right lower leg: Secondary | ICD-10-CM

## 2022-03-29 DIAGNOSIS — Z0000 Encounter for general adult medical examination without abnormal findings: Secondary | ICD-10-CM

## 2022-03-29 DIAGNOSIS — E7849 Other hyperlipidemia: Secondary | ICD-10-CM

## 2022-03-29 MED ORDER — TRAZODONE HCL 50 MG PO TABS
75.0000 mg | ORAL_TABLET | Freq: Every day | ORAL | 0 refills | Status: DC
  Filled 2022-03-29: qty 180, 90d supply, fill #0

## 2022-03-29 NOTE — Telephone Encounter (Signed)
Notified pt MD sent med to pharmacy. Pt rescheduled the appt because she need a CPX. She is coming in 2/26 @ 10:40. Pt is wanting to do labs prior to appt. Pt has [employer].Marland KitchenJohny Chess

## 2022-03-29 NOTE — Telephone Encounter (Signed)
Blood work ordered.

## 2022-03-29 NOTE — Telephone Encounter (Signed)
Pt wanting to know if the doctor can prescribe her medication to carry her over until her upcoming appt Feb 12th '@10'$ :40  traZODone (DESYREL) 50 MG tablet   Call back with update

## 2022-03-29 NOTE — Telephone Encounter (Signed)
Yes, prescription sent to [facility]

## 2022-03-30 ENCOUNTER — Other Ambulatory Visit: Payer: Self-pay

## 2022-03-30 NOTE — Telephone Encounter (Signed)
Lab appointment made.

## 2022-04-03 ENCOUNTER — Other Ambulatory Visit (INDEPENDENT_AMBULATORY_CARE_PROVIDER_SITE_OTHER): Payer: Commercial Managed Care - PPO

## 2022-04-03 ENCOUNTER — Ambulatory Visit: Payer: Commercial Managed Care - PPO | Admitting: Internal Medicine

## 2022-04-03 DIAGNOSIS — E7849 Other hyperlipidemia: Secondary | ICD-10-CM

## 2022-04-03 DIAGNOSIS — M85862 Other specified disorders of bone density and structure, left lower leg: Secondary | ICD-10-CM

## 2022-04-03 DIAGNOSIS — R739 Hyperglycemia, unspecified: Secondary | ICD-10-CM | POA: Diagnosis not present

## 2022-04-03 DIAGNOSIS — M85861 Other specified disorders of bone density and structure, right lower leg: Secondary | ICD-10-CM

## 2022-04-03 DIAGNOSIS — J3081 Allergic rhinitis due to animal (cat) (dog) hair and dander: Secondary | ICD-10-CM | POA: Diagnosis not present

## 2022-04-03 DIAGNOSIS — J3089 Other allergic rhinitis: Secondary | ICD-10-CM | POA: Diagnosis not present

## 2022-04-03 DIAGNOSIS — J301 Allergic rhinitis due to pollen: Secondary | ICD-10-CM | POA: Diagnosis not present

## 2022-04-03 LAB — COMPREHENSIVE METABOLIC PANEL
ALT: 22 U/L (ref 0–35)
AST: 19 U/L (ref 0–37)
Albumin: 4.2 g/dL (ref 3.5–5.2)
Alkaline Phosphatase: 66 U/L (ref 39–117)
BUN: 13 mg/dL (ref 6–23)
CO2: 27 mEq/L (ref 19–32)
Calcium: 9.5 mg/dL (ref 8.4–10.5)
Chloride: 106 mEq/L (ref 96–112)
Creatinine, Ser: 0.91 mg/dL (ref 0.40–1.20)
GFR: 66.41 mL/min (ref >60.00)
Glucose, Bld: 86 mg/dL (ref 70–99)
Potassium: 4.6 mEq/L (ref 3.5–5.1)
Sodium: 139 mEq/L (ref 135–145)
Total Bilirubin: 0.5 mg/dL (ref 0.2–1.2)
Total Protein: 6.6 g/dL (ref 6.0–8.3)

## 2022-04-03 LAB — CBC WITH DIFFERENTIAL/PLATELET
Basophils Absolute: 0.1 10*3/uL (ref 0.0–0.1)
Basophils Relative: 1 % (ref 0.0–3.0)
Eosinophils Absolute: 0.3 10*3/uL (ref 0.0–0.7)
Eosinophils Relative: 4.7 % (ref 0.0–5.0)
HCT: 40.7 % (ref 36.0–46.0)
Hemoglobin: 13.9 g/dL (ref 12.0–15.0)
Lymphocytes Relative: 42.6 % (ref 12.0–46.0)
Lymphs Abs: 3.1 10*3/uL (ref 0.7–4.0)
MCHC: 34.1 g/dL (ref 30.0–36.0)
MCV: 97.5 fl (ref 78.0–100.0)
Monocytes Absolute: 0.6 10*3/uL (ref 0.1–1.0)
Monocytes Relative: 8.2 % (ref 3.0–12.0)
Neutro Abs: 3.1 10*3/uL (ref 1.4–7.7)
Neutrophils Relative %: 43.5 % (ref 43.0–77.0)
Platelets: 309 10*3/uL (ref 150.0–400.0)
RBC: 4.17 Mil/uL (ref 3.87–5.11)
RDW: 13.4 % (ref 11.5–15.5)
WBC: 7.2 10*3/uL (ref 4.0–10.5)

## 2022-04-03 LAB — TSH: TSH: 3.14 u[IU]/mL (ref 0.35–5.50)

## 2022-04-03 LAB — LIPID PANEL
Cholesterol: 197 mg/dL (ref 0–200)
HDL: 60.4 mg/dL (ref >39.00)
LDL Cholesterol: 109 mg/dL — ABNORMAL HIGH (ref 0–99)
NonHDL: 136.83
Total CHOL/HDL Ratio: 3
Triglycerides: 137 mg/dL (ref 0.0–149.0)
VLDL: 27.4 mg/dL (ref 0.0–40.0)

## 2022-04-03 LAB — HEMOGLOBIN A1C: Hgb A1c MFr Bld: 5.8 % (ref 4.6–6.5)

## 2022-04-03 LAB — VITAMIN D 25 HYDROXY (VIT D DEFICIENCY, FRACTURES): VITD: 33.42 ng/mL (ref 30.00–100.00)

## 2022-04-05 ENCOUNTER — Other Ambulatory Visit (HOSPITAL_COMMUNITY): Payer: Self-pay

## 2022-04-06 ENCOUNTER — Other Ambulatory Visit (HOSPITAL_COMMUNITY): Payer: Self-pay

## 2022-04-06 MED ORDER — VITAMIN D (ERGOCALCIFEROL) 1.25 MG (50000 UNIT) PO CAPS
ORAL_CAPSULE | ORAL | 0 refills | Status: AC
  Filled 2022-04-06: qty 6, 88d supply, fill #0
  Filled 2022-05-14: qty 6, fill #1
  Filled 2022-05-17 – 2022-06-28 (×2): qty 3, 90d supply, fill #1

## 2022-04-16 ENCOUNTER — Encounter: Payer: Self-pay | Admitting: Internal Medicine

## 2022-04-16 NOTE — Progress Notes (Unsigned)
Subjective:    Patient ID: Maria Kane, female    DOB: Jan 11, 1956, 67 y.o.   MRN: [id number]      HPI Terren is here for a Physical exam.    Overall doing well.    Small spot on right forehead. Present x 1 month.  She does see dermatologist and thought about going to see him.   Infection in left eye - been there for one month.  Lower eye lid  - stye, no pain, no discharge.     Medications and allergies reviewed with patient and updated if appropriate.  Current Outpatient Medications on File Prior to Visit  Medication Sig Dispense Refill   CALCIUM PO Take by mouth.     cetirizine (ZYRTEC ALLERGY) 10 MG tablet Take 1 tablet (10 mg total) by mouth daily. 90 tablet 3   EPINEPHrine 0.3 mg/0.3 mL IJ SOAJ injection Inject 0.3 mg into the muscle as needed for anaphylaxis. 1 each 2   estradiol (VIVELLE-DOT) 0.025 MG/24HR PLACE 1 PATCH ONTO THE SKIN 2 TIMES A WEEK. 24 patch 0   hydrocortisone (ANUSOL-HC) 25 MG suppository UNWRAP AND INSERT 1 SUPPOSITORY RECTALLY TWO TIMES DAILY AS NEEDED FOR HEMORRHOIDS OR ITCHING 60 suppository 1   hydrocortisone (PROCTOZONE-HC) 2.5 % rectal cream Place 1 Application rectally 2 (two) times daily as needed. 28 g 2   metroNIDAZOLE (METROGEL) 0.75 % gel Apply daily to affected area of face 45 g 1   Multiple Vitamin (MULTIVITAMIN) tablet Take 1 tablet by mouth daily.     pimecrolimus (ELIDEL) 1 % cream Apply to affected area as directed. 60 g 1   S-Adenosylmethionine (SAM-E PO) Take by mouth.     traZODone (DESYREL) 50 MG tablet Take 1.5-2 tablets (75-100 mg total) by mouth at bedtime. 180 tablet 0   tretinoin (RETIN-A) 0.05 % cream Apply thin film to hyperpigmented areas at night 45 g 1   Vitamin D, Ergocalciferol, (DRISDOL) 1.25 MG (50000 UNIT) CAPS capsule Take 1 capsule (50,000 Units total) by mouth once a week for 28 days, THEN 1 capsule (50,000 Units total) every 30 (thirty) days. 12 capsule 0   fluticasone (FLONASE) 50 MCG/ACT nasal spray Place 2  sprays into both nostrils daily. (Patient not taking: Reported on 04/17/2022) 16 g 0   No current facility-administered medications on file prior to visit.    Review of Systems  Constitutional:  Negative for fever.  Eyes:  Negative for visual disturbance.  Respiratory:  Negative for cough, shortness of breath and wheezing.   Cardiovascular:  Negative for chest pain, palpitations and leg swelling.  Gastrointestinal:  Positive for diarrhea (intermittent  - certain foods, no GB). Negative for abdominal pain, blood in stool and constipation.       No gerd  Genitourinary:  Negative for dysuria.  Musculoskeletal:  Negative for arthralgias and back pain.  Skin:  Negative for rash.  Neurological:  Positive for light-headedness. Negative for dizziness, numbness and headaches.  Psychiatric/Behavioral:  Negative for dysphoric mood. The patient is not nervous/anxious.        Objective:   Vitals:   04/17/22 1046  BP: 122/80  Pulse: 67  Temp: 98 F (36.7 C)  SpO2: 100%   Filed Weights   04/17/22 1046  Weight: 150 lb (68 kg)   Body mass index is 24.96 kg/m.  BP Readings from Last 3 Encounters:  04/17/22 122/80  12/20/21 118/80  08/22/21 (!) 130/91    Wt Readings from Last 3 Encounters:  04/17/22  150 lb (68 kg)  12/20/21 151 lb (68.5 kg)  02/23/21 157 lb 6.4 oz (71.4 kg)       Physical Exam Constitutional: She appears well-developed and well-nourished. No distress.  HENT:  Head: Normocephalic and atraumatic.  Right Ear: External ear normal. Normal ear canal and TM Left Ear: External ear normal.  Normal ear canal and TM Mouth/Throat: Oropharynx is clear and moist.  Eyes: Conjunctivae normal.  Left lower eyelid with visible stye Neck: Neck supple. No tracheal deviation present. No thyromegaly present.  No carotid bruit  Cardiovascular: Normal rate, regular rhythm and normal heart sounds.   No murmur heard.  No edema. Pulmonary/Chest: Effort normal and breath sounds normal.  No respiratory distress. She has no wheezes. She has no rales.  Breast: deferred   Abdominal: Soft. She exhibits no distension. There is no tenderness.  Lymphadenopathy: She has no cervical adenopathy.  Skin: Skin is warm and dry. She is not diaphoretic.  Psychiatric: She has a normal mood and affect. Her behavior is normal.     Lab Results  Component Value Date   WBC 7.2 04/03/2022   HGB 13.9 04/03/2022   HCT 40.7 04/03/2022   PLT 309.0 04/03/2022   GLUCOSE 86 04/03/2022   CHOL 197 04/03/2022   TRIG 137.0 04/03/2022   HDL 60.40 04/03/2022   LDLDIRECT 114.0 02/17/2015   LDLCALC 109 (H) 04/03/2022   ALT 22 04/03/2022   AST 19 04/03/2022   NA 139 04/03/2022   K 4.6 04/03/2022   CL 106 04/03/2022   CREATININE 0.91 04/03/2022   BUN 13 04/03/2022   CO2 27 04/03/2022   TSH 3.14 04/03/2022   INR 0.95 10/12/2013   HGBA1C 5.8 04/03/2022   MICROALBUR <0.7 02/08/2021         Assessment & Plan:   Physical exam: Screening blood work  done prior to apt Exercise  regular Weight  normal Substance abuse  none   Reviewed recommended immunizations.   Prevnar 20 vaccine today   Health Maintenance  Topic Date Due   COVID-19 Vaccine (5 - 2023-24 season) 01/19/2022   Pneumonia Vaccine 57+ Years old (1 of 1 - PCV) Never done   MAMMOGRAM  01/27/2024   DEXA SCAN  01/27/2024   DTaP/Tdap/Td (3 - Td or Tdap) 02/05/2024   COLONOSCOPY (Pts 45-8yr Insurance coverage will need to be confirmed)  11/28/2028   INFLUENZA VACCINE  Completed   Hepatitis C Screening  Completed   HIV Screening  Completed   Zoster Vaccines- Shingrix  Completed   HPV VACCINES  Aged Out       She will be traveling in a couple of months and wondered about taking an antibiotic with her in the event that she gets a sinus infection which she has gotten previously.  I did give her a prescription for Augmentin 875-125 mg to be taken twice daily x 1 week.  Discussed when to use this and when not to use it.   See  Problem List for Assessment and Plan of chronic medical problems.

## 2022-04-16 NOTE — Patient Instructions (Addendum)
Medications changes include :   None     Return in about 1 year (around 04/18/2023) for Physical Exam.   Health Maintenance, Female Adopting a healthy lifestyle and getting preventive care are important in promoting health and wellness. Ask your health care provider about: The right schedule for you to have regular tests and exams. Things you can do on your own to prevent diseases and keep yourself healthy. What should I know about diet, weight, and exercise? Eat a healthy diet  Eat a diet that includes plenty of vegetables, fruits, low-fat dairy products, and lean protein. Do not eat a lot of foods that are high in solid fats, added sugars, or sodium. Maintain a healthy weight Body mass index (BMI) is used to identify weight problems. It estimates body fat based on height and weight. Your health care provider can help determine your BMI and help you achieve or maintain a healthy weight. Get regular exercise Get regular exercise. This is one of the most important things you can do for your health. Most adults should: Exercise for at least 150 minutes each week. The exercise should increase your heart rate and make you sweat (moderate-intensity exercise). Do strengthening exercises at least twice a week. This is in addition to the moderate-intensity exercise. Spend less time sitting. Even light physical activity can be beneficial. Watch cholesterol and blood lipids Have your blood tested for lipids and cholesterol at 65 years of age, then have this test every 5 years. Have your cholesterol levels checked more often if: Your lipid or cholesterol levels are high. You are older than 65 years of age. You are at high risk for heart disease. What should I know about cancer screening? Depending on your health history and family history, you may need to have cancer screening at various ages. This may include screening for: Breast cancer. Cervical cancer. Colorectal cancer. Skin  cancer. Lung cancer. What should I know about heart disease, diabetes, and high blood pressure? Blood pressure and heart disease High blood pressure causes heart disease and increases the risk of stroke. This is more likely to develop in people who have high blood pressure readings or are overweight. Have your blood pressure checked: Every 3-5 years if you are 71-93 years of age. Every year if you are 22 years old or older. Diabetes Have regular diabetes screenings. This checks your fasting blood sugar level. Have the screening done: Once every three years after age 25 if you are at a normal weight and have a low risk for diabetes. More often and at a younger age if you are overweight or have a high risk for diabetes. What should I know about preventing infection? Hepatitis B If you have a higher risk for hepatitis B, you should be screened for this virus. Talk with your health care provider to find out if you are at risk for hepatitis B infection. Hepatitis C Testing is recommended for: Everyone born from [birth year] through 1965. Anyone with known risk factors for hepatitis C. Sexually transmitted infections (STIs) Get screened for STIs, including gonorrhea and chlamydia, if: You are sexually active and are younger than 65 years of age. You are older than 65 years of age and your health care provider tells you that you are at risk for this type of infection. Your sexual activity has changed since you were last screened, and you are at increased risk for chlamydia or gonorrhea. Ask your health care provider if you are at risk. Ask  your health care provider about whether you are at high risk for HIV. Your health care provider may recommend a prescription medicine to help prevent HIV infection. If you choose to take medicine to prevent HIV, you should first get tested for HIV. You should then be tested every 3 months for as long as you are taking the medicine. Pregnancy If you are about to stop  having your period (premenopausal) and you may become pregnant, seek counseling before you get pregnant. Take 400 to 800 micrograms (mcg) of folic acid every day if you become pregnant. Ask for birth control (contraception) if you want to prevent pregnancy. Osteoporosis and menopause Osteoporosis is a disease in which the bones lose minerals and strength with aging. This can result in bone fractures. If you are 69 years old or older, or if you are at risk for osteoporosis and fractures, ask your health care provider if you should: Be screened for bone loss. Take a calcium or vitamin D supplement to lower your risk of fractures. Be given hormone replacement therapy (HRT) to treat symptoms of menopause. Follow these instructions at home: Alcohol use Do not drink alcohol if: Your health care provider tells you not to drink. You are pregnant, may be pregnant, or are planning to become pregnant. If you drink alcohol: Limit how much you have to: 0-1 drink a day. Know how much alcohol is in your drink. In the [place] one drink equals one 12 oz bottle of beer (355 mL), one 5 oz glass of wine (148 mL), or one 1 oz glass of hard liquor (44 mL). Lifestyle Do not use any products that contain nicotine or tobacco. These products include cigarettes, chewing tobacco, and vaping devices, such as e-cigarettes. If you need help quitting, ask your health care provider. Do not use street drugs. Do not share needles. Ask your health care provider for help if you need support or information about quitting drugs. General instructions Schedule regular health, dental, and eye exams. Stay current with your vaccines. Tell your health care provider if: You often feel depressed. You have ever been abused or do not feel safe at home. Summary Adopting a healthy lifestyle and getting preventive care are important in promoting health and wellness. Follow your health care provider's instructions about healthy diet,  exercising, and getting tested or screened for diseases. Follow your health care provider's instructions on monitoring your cholesterol and blood pressure. This information is not intended to replace advice given to you by your health care provider. Make sure you discuss any questions you have with your health care provider. Document Revised: 06/28/2020 Document Reviewed: 06/28/2020 Elsevier Patient Education  [employer].

## 2022-04-17 ENCOUNTER — Other Ambulatory Visit: Payer: Self-pay

## 2022-04-17 ENCOUNTER — Encounter: Payer: Self-pay | Admitting: Internal Medicine

## 2022-04-17 ENCOUNTER — Ambulatory Visit: Payer: Commercial Managed Care - PPO | Admitting: Internal Medicine

## 2022-04-17 VITALS — BP 122/80 | HR 67 | Temp 98.0°F | Ht 65.0 in | Wt 150.0 lb

## 2022-04-17 DIAGNOSIS — R7303 Prediabetes: Secondary | ICD-10-CM

## 2022-04-17 DIAGNOSIS — E7849 Other hyperlipidemia: Secondary | ICD-10-CM

## 2022-04-17 DIAGNOSIS — M85861 Other specified disorders of bone density and structure, right lower leg: Secondary | ICD-10-CM | POA: Diagnosis not present

## 2022-04-17 DIAGNOSIS — M85862 Other specified disorders of bone density and structure, left lower leg: Secondary | ICD-10-CM

## 2022-04-17 DIAGNOSIS — Z23 Encounter for immunization: Secondary | ICD-10-CM | POA: Diagnosis not present

## 2022-04-17 DIAGNOSIS — Z905 Acquired absence of kidney: Secondary | ICD-10-CM

## 2022-04-17 DIAGNOSIS — Z0000 Encounter for general adult medical examination without abnormal findings: Secondary | ICD-10-CM | POA: Diagnosis not present

## 2022-04-17 DIAGNOSIS — H00015 Hordeolum externum left lower eyelid: Secondary | ICD-10-CM

## 2022-04-17 DIAGNOSIS — H00019 Hordeolum externum unspecified eye, unspecified eyelid: Secondary | ICD-10-CM | POA: Insufficient documentation

## 2022-04-17 DIAGNOSIS — G4709 Other insomnia: Secondary | ICD-10-CM | POA: Diagnosis not present

## 2022-04-17 MED ORDER — TRAZODONE HCL 50 MG PO TABS
75.0000 mg | ORAL_TABLET | Freq: Every day | ORAL | 3 refills | Status: DC
  Filled 2022-04-17 – 2022-06-28 (×2): qty 180, 90d supply, fill #0
  Filled 2022-08-06 – 2022-10-19 (×2): qty 180, 90d supply, fill #1

## 2022-04-17 MED ORDER — AMOXICILLIN-POT CLAVULANATE 875-125 MG PO TABS
1.0000 | ORAL_TABLET | Freq: Two times a day (BID) | ORAL | 0 refills | Status: AC
  Filled 2022-04-17 – 2022-04-18 (×2): qty 14, 7d supply, fill #0

## 2022-04-17 MED ORDER — ESTRADIOL 0.025 MG/24HR TD PTTW
MEDICATED_PATCH | TRANSDERMAL | 3 refills | Status: AC
  Filled 2022-04-17 – 2022-05-14 (×2): qty 24, fill #0
  Filled 2022-05-17: qty 24, 84d supply, fill #0
  Filled 2022-08-06: qty 24, 84d supply, fill #1
  Filled 2022-10-19: qty 24, 84d supply, fill #2
  Filled 2023-01-15: qty 24, 84d supply, fill #3

## 2022-04-17 MED ORDER — ERYTHROMYCIN 5 MG/GM OP OINT
1.0000 | TOPICAL_OINTMENT | Freq: Every day | OPHTHALMIC | 0 refills | Status: DC
  Filled 2022-04-17 – 2022-04-18 (×2): qty 3.5, 3d supply, fill #0

## 2022-04-17 NOTE — Assessment & Plan Note (Signed)
Chronic A1c 5.8% Continue low sugar/carbohydrate diet Continue regular exercise

## 2022-04-17 NOTE — Assessment & Plan Note (Addendum)
Chronic Dexa up to date Continue regular exercise Continue calcium 1 pill daily and vitamin d daily On high-dose vitamin D by nephrology

## 2022-04-17 NOTE — Assessment & Plan Note (Signed)
Chronic Continue trazodone 75-100 mg nightly

## 2022-04-17 NOTE — Assessment & Plan Note (Signed)
Chronic Following with nephrology GFR stable

## 2022-04-17 NOTE — Assessment & Plan Note (Signed)
Chronic Cholesterol well-controlled Continue healthy diet, regular exercise

## 2022-04-17 NOTE — Assessment & Plan Note (Signed)
Acute Left lower eyelid lid Started about 1 month ago-not getting smaller Advise warm compresses, erythromycin ointment Most likely will need to see her eye doctor for further treatment

## 2022-04-18 ENCOUNTER — Other Ambulatory Visit (HOSPITAL_COMMUNITY): Payer: Self-pay

## 2022-04-18 ENCOUNTER — Other Ambulatory Visit: Payer: Self-pay

## 2022-04-18 DIAGNOSIS — L820 Inflamed seborrheic keratosis: Secondary | ICD-10-CM | POA: Diagnosis not present

## 2022-04-18 DIAGNOSIS — Z85828 Personal history of other malignant neoplasm of skin: Secondary | ICD-10-CM | POA: Diagnosis not present

## 2022-04-18 DIAGNOSIS — D485 Neoplasm of uncertain behavior of skin: Secondary | ICD-10-CM | POA: Diagnosis not present

## 2022-04-19 ENCOUNTER — Other Ambulatory Visit (HOSPITAL_BASED_OUTPATIENT_CLINIC_OR_DEPARTMENT_OTHER): Payer: Self-pay

## 2022-04-20 DIAGNOSIS — H0015 Chalazion left lower eyelid: Secondary | ICD-10-CM | POA: Diagnosis not present

## 2022-04-21 ENCOUNTER — Other Ambulatory Visit (HOSPITAL_COMMUNITY): Payer: Self-pay

## 2022-04-21 DIAGNOSIS — H0015 Chalazion left lower eyelid: Secondary | ICD-10-CM | POA: Diagnosis not present

## 2022-04-21 MED ORDER — NEOMYCIN-POLYMYXIN-DEXAMETH 0.1 % OP SUSP
1.0000 [drp] | Freq: Three times a day (TID) | OPHTHALMIC | 0 refills | Status: DC
  Filled 2022-04-21: qty 5, 17d supply, fill #0

## 2022-04-22 ENCOUNTER — Other Ambulatory Visit (HOSPITAL_COMMUNITY): Payer: Self-pay

## 2022-04-24 DIAGNOSIS — J3081 Allergic rhinitis due to animal (cat) (dog) hair and dander: Secondary | ICD-10-CM | POA: Diagnosis not present

## 2022-04-24 DIAGNOSIS — J301 Allergic rhinitis due to pollen: Secondary | ICD-10-CM | POA: Diagnosis not present

## 2022-04-24 DIAGNOSIS — J3089 Other allergic rhinitis: Secondary | ICD-10-CM | POA: Diagnosis not present

## 2022-04-25 ENCOUNTER — Other Ambulatory Visit (HOSPITAL_COMMUNITY): Payer: Self-pay

## 2022-05-05 DIAGNOSIS — J3081 Allergic rhinitis due to animal (cat) (dog) hair and dander: Secondary | ICD-10-CM | POA: Diagnosis not present

## 2022-05-05 DIAGNOSIS — J3089 Other allergic rhinitis: Secondary | ICD-10-CM | POA: Diagnosis not present

## 2022-05-05 DIAGNOSIS — J301 Allergic rhinitis due to pollen: Secondary | ICD-10-CM | POA: Diagnosis not present

## 2022-05-14 ENCOUNTER — Other Ambulatory Visit: Payer: Self-pay

## 2022-05-15 ENCOUNTER — Other Ambulatory Visit: Payer: Self-pay

## 2022-05-17 ENCOUNTER — Other Ambulatory Visit: Payer: Self-pay

## 2022-05-18 ENCOUNTER — Other Ambulatory Visit: Payer: Self-pay

## 2022-05-29 DIAGNOSIS — J301 Allergic rhinitis due to pollen: Secondary | ICD-10-CM | POA: Diagnosis not present

## 2022-05-29 DIAGNOSIS — J3089 Other allergic rhinitis: Secondary | ICD-10-CM | POA: Diagnosis not present

## 2022-05-29 DIAGNOSIS — J3081 Allergic rhinitis due to animal (cat) (dog) hair and dander: Secondary | ICD-10-CM | POA: Diagnosis not present

## 2022-06-05 DIAGNOSIS — J3081 Allergic rhinitis due to animal (cat) (dog) hair and dander: Secondary | ICD-10-CM | POA: Diagnosis not present

## 2022-06-05 DIAGNOSIS — J3089 Other allergic rhinitis: Secondary | ICD-10-CM | POA: Diagnosis not present

## 2022-06-05 DIAGNOSIS — J301 Allergic rhinitis due to pollen: Secondary | ICD-10-CM | POA: Diagnosis not present

## 2022-06-26 DIAGNOSIS — J301 Allergic rhinitis due to pollen: Secondary | ICD-10-CM | POA: Diagnosis not present

## 2022-06-26 DIAGNOSIS — J3081 Allergic rhinitis due to animal (cat) (dog) hair and dander: Secondary | ICD-10-CM | POA: Diagnosis not present

## 2022-06-26 DIAGNOSIS — J3089 Other allergic rhinitis: Secondary | ICD-10-CM | POA: Diagnosis not present

## 2022-06-29 ENCOUNTER — Other Ambulatory Visit (HOSPITAL_COMMUNITY): Payer: Self-pay

## 2022-06-29 ENCOUNTER — Other Ambulatory Visit: Payer: Self-pay

## 2022-07-10 ENCOUNTER — Other Ambulatory Visit (HOSPITAL_COMMUNITY): Payer: Self-pay

## 2022-07-10 DIAGNOSIS — D1801 Hemangioma of skin and subcutaneous tissue: Secondary | ICD-10-CM | POA: Diagnosis not present

## 2022-07-10 DIAGNOSIS — D2272 Melanocytic nevi of left lower limb, including hip: Secondary | ICD-10-CM | POA: Diagnosis not present

## 2022-07-10 DIAGNOSIS — D2372 Other benign neoplasm of skin of left lower limb, including hip: Secondary | ICD-10-CM | POA: Diagnosis not present

## 2022-07-10 DIAGNOSIS — L718 Other rosacea: Secondary | ICD-10-CM | POA: Diagnosis not present

## 2022-07-10 DIAGNOSIS — D225 Melanocytic nevi of trunk: Secondary | ICD-10-CM | POA: Diagnosis not present

## 2022-07-10 DIAGNOSIS — L578 Other skin changes due to chronic exposure to nonionizing radiation: Secondary | ICD-10-CM | POA: Diagnosis not present

## 2022-07-10 DIAGNOSIS — D2262 Melanocytic nevi of left upper limb, including shoulder: Secondary | ICD-10-CM | POA: Diagnosis not present

## 2022-07-10 DIAGNOSIS — Z85828 Personal history of other malignant neoplasm of skin: Secondary | ICD-10-CM | POA: Diagnosis not present

## 2022-07-10 DIAGNOSIS — D2371 Other benign neoplasm of skin of right lower limb, including hip: Secondary | ICD-10-CM | POA: Diagnosis not present

## 2022-07-10 DIAGNOSIS — L821 Other seborrheic keratosis: Secondary | ICD-10-CM | POA: Diagnosis not present

## 2022-07-10 MED ORDER — METRONIDAZOLE 0.75 % EX GEL
1.0000 | Freq: Every day | CUTANEOUS | 2 refills | Status: DC
  Filled 2022-07-10: qty 45, 30d supply, fill #0

## 2022-07-13 DIAGNOSIS — J301 Allergic rhinitis due to pollen: Secondary | ICD-10-CM | POA: Diagnosis not present

## 2022-07-13 DIAGNOSIS — J3089 Other allergic rhinitis: Secondary | ICD-10-CM | POA: Diagnosis not present

## 2022-07-13 DIAGNOSIS — J3081 Allergic rhinitis due to animal (cat) (dog) hair and dander: Secondary | ICD-10-CM | POA: Diagnosis not present

## 2022-08-02 DIAGNOSIS — J3081 Allergic rhinitis due to animal (cat) (dog) hair and dander: Secondary | ICD-10-CM | POA: Diagnosis not present

## 2022-08-02 DIAGNOSIS — J3089 Other allergic rhinitis: Secondary | ICD-10-CM | POA: Diagnosis not present

## 2022-08-02 DIAGNOSIS — J301 Allergic rhinitis due to pollen: Secondary | ICD-10-CM | POA: Diagnosis not present

## 2022-08-07 ENCOUNTER — Other Ambulatory Visit: Payer: Self-pay

## 2022-08-23 DIAGNOSIS — J3089 Other allergic rhinitis: Secondary | ICD-10-CM | POA: Diagnosis not present

## 2022-08-23 DIAGNOSIS — J3081 Allergic rhinitis due to animal (cat) (dog) hair and dander: Secondary | ICD-10-CM | POA: Diagnosis not present

## 2022-08-23 DIAGNOSIS — J301 Allergic rhinitis due to pollen: Secondary | ICD-10-CM | POA: Diagnosis not present

## 2022-08-28 DIAGNOSIS — L661 Lichen planopilaris: Secondary | ICD-10-CM | POA: Diagnosis not present

## 2022-08-30 DIAGNOSIS — J301 Allergic rhinitis due to pollen: Secondary | ICD-10-CM | POA: Diagnosis not present

## 2022-08-31 DIAGNOSIS — J3089 Other allergic rhinitis: Secondary | ICD-10-CM | POA: Diagnosis not present

## 2022-08-31 DIAGNOSIS — J3081 Allergic rhinitis due to animal (cat) (dog) hair and dander: Secondary | ICD-10-CM | POA: Diagnosis not present

## 2022-09-12 DIAGNOSIS — J301 Allergic rhinitis due to pollen: Secondary | ICD-10-CM | POA: Diagnosis not present

## 2022-09-12 DIAGNOSIS — J3081 Allergic rhinitis due to animal (cat) (dog) hair and dander: Secondary | ICD-10-CM | POA: Diagnosis not present

## 2022-09-12 DIAGNOSIS — J3089 Other allergic rhinitis: Secondary | ICD-10-CM | POA: Diagnosis not present

## 2022-10-09 ENCOUNTER — Encounter: Payer: Self-pay | Admitting: Internal Medicine

## 2022-10-09 DIAGNOSIS — J301 Allergic rhinitis due to pollen: Secondary | ICD-10-CM | POA: Diagnosis not present

## 2022-10-09 DIAGNOSIS — E7849 Other hyperlipidemia: Secondary | ICD-10-CM

## 2022-10-09 DIAGNOSIS — M85861 Other specified disorders of bone density and structure, right lower leg: Secondary | ICD-10-CM

## 2022-10-09 DIAGNOSIS — R7303 Prediabetes: Secondary | ICD-10-CM

## 2022-10-09 DIAGNOSIS — J3081 Allergic rhinitis due to animal (cat) (dog) hair and dander: Secondary | ICD-10-CM | POA: Diagnosis not present

## 2022-10-09 DIAGNOSIS — J3089 Other allergic rhinitis: Secondary | ICD-10-CM | POA: Diagnosis not present

## 2022-10-16 ENCOUNTER — Other Ambulatory Visit: Payer: Commercial Managed Care - PPO

## 2022-10-19 ENCOUNTER — Encounter: Payer: Self-pay | Admitting: Internal Medicine

## 2022-10-20 ENCOUNTER — Other Ambulatory Visit (HOSPITAL_COMMUNITY): Payer: Self-pay

## 2022-10-20 ENCOUNTER — Encounter: Payer: Self-pay | Admitting: Internal Medicine

## 2022-10-20 MED ORDER — HYDROCORTISONE ACETATE 25 MG RE SUPP
25.0000 mg | Freq: Two times a day (BID) | RECTAL | 5 refills | Status: DC | PRN
  Filled 2022-10-20: qty 12, 6d supply, fill #0
  Filled 2023-01-15: qty 24, 12d supply, fill #1

## 2022-10-20 MED ORDER — HYDROCORTISONE (PERIANAL) 2.5 % EX CREA
1.0000 | TOPICAL_CREAM | Freq: Two times a day (BID) | CUTANEOUS | 2 refills | Status: DC | PRN
  Filled 2022-10-20: qty 30, 10d supply, fill #0
  Filled 2023-03-21: qty 30, 10d supply, fill #1

## 2022-10-21 ENCOUNTER — Other Ambulatory Visit (HOSPITAL_BASED_OUTPATIENT_CLINIC_OR_DEPARTMENT_OTHER): Payer: Self-pay

## 2022-10-21 ENCOUNTER — Other Ambulatory Visit (HOSPITAL_COMMUNITY): Payer: Self-pay

## 2022-10-25 ENCOUNTER — Ambulatory Visit: Payer: Commercial Managed Care - PPO | Admitting: Internal Medicine

## 2022-10-25 ENCOUNTER — Encounter (HOSPITAL_COMMUNITY): Payer: Self-pay

## 2022-10-25 ENCOUNTER — Other Ambulatory Visit (HOSPITAL_COMMUNITY): Payer: Self-pay

## 2022-10-25 ENCOUNTER — Ambulatory Visit (INDEPENDENT_AMBULATORY_CARE_PROVIDER_SITE_OTHER): Payer: Commercial Managed Care - PPO | Admitting: Nurse Practitioner

## 2022-10-25 ENCOUNTER — Encounter: Payer: Self-pay | Admitting: Nurse Practitioner

## 2022-10-25 VITALS — BP 140/70 | HR 81 | Temp 98.2°F | Resp 16 | Ht 65.0 in | Wt 146.0 lb

## 2022-10-25 DIAGNOSIS — N750 Cyst of Bartholin's gland: Secondary | ICD-10-CM

## 2022-10-25 DIAGNOSIS — J301 Allergic rhinitis due to pollen: Secondary | ICD-10-CM | POA: Diagnosis not present

## 2022-10-25 DIAGNOSIS — J3089 Other allergic rhinitis: Secondary | ICD-10-CM | POA: Diagnosis not present

## 2022-10-25 DIAGNOSIS — R030 Elevated blood-pressure reading, without diagnosis of hypertension: Secondary | ICD-10-CM | POA: Diagnosis not present

## 2022-10-25 DIAGNOSIS — J3081 Allergic rhinitis due to animal (cat) (dog) hair and dander: Secondary | ICD-10-CM | POA: Diagnosis not present

## 2022-10-25 MED ORDER — CLINDAMYCIN HCL 300 MG PO CAPS
300.0000 mg | ORAL_CAPSULE | Freq: Three times a day (TID) | ORAL | 0 refills | Status: DC
  Filled 2022-10-25: qty 15, 5d supply, fill #0

## 2022-10-25 NOTE — Progress Notes (Signed)
Established Patient Visit  Patient: Maria Kane   DOB: 1955-10-02   67 y.o. Female  MRN: [id number] Visit Date: 10/25/2022  Subjective:    Chief Complaint  Patient presents with   lump on labia    Size of baby finger nail. Painful at first but now a little itchy. Noticed 3 weeks    Rash This is a new problem. The current episode started 1 to 4 weeks ago. The problem has been gradually improving since onset. Location: labia. The rash is characterized by pain, redness and swelling. She was exposed to plant contact. Pertinent negatives include no fever or joint pain. (No groin pain) Treatments tried: sitz bath. The treatment provided moderate relief.   BP Readings from Last 3 Encounters:  10/25/22 (!) 140/70  04/17/22 122/80  12/20/21 118/80    Reviewed medical, surgical, and social history today  Medications: Outpatient Medications Prior to Visit  Medication Sig   CALCIUM PO Take by mouth.   cetirizine (ZYRTEC ALLERGY) 10 MG tablet Take 1 tablet (10 mg total) by mouth daily.   EPINEPHrine 0.3 mg/0.3 mL IJ SOAJ injection Inject 0.3 mg into the muscle as needed for anaphylaxis.   estradiol (VIVELLE-DOT) 0.025 MG/24HR PLACE 1 PATCH ONTO THE SKIN 2 TIMES A WEEK.   hydrocortisone (ANUSOL-HC) 25 MG suppository Place 1 suppository (25 mg total) rectally 2 (two) times daily as needed for hemorrhoids or anal itching.   hydrocortisone (PROCTOZONE-HC) 2.5 % rectal cream Place 1 Application rectally 2 (two) times daily as needed.   metroNIDAZOLE (METROGEL) 0.75 % gel Apply daily to affected area of face   metroNIDAZOLE (METROGEL) 0.75 % gel Apply small amount topically to face once daily.   Multiple Vitamin (MULTIVITAMIN) tablet Take 1 tablet by mouth daily.   neomycin-polymyxin-dexamethasone (MAXITROL) 0.1 % ophthalmic suspension Place 1 drop into affected eye(s) 3 (three) times daily.   pimecrolimus (ELIDEL) 1 % cream Apply to affected area as directed.   S-Adenosylmethionine  (SAM-E PO) Take by mouth.   traZODone (DESYREL) 50 MG tablet Take 1.5-2 tablets (75-100 mg total) by mouth at bedtime.   tretinoin (RETIN-A) 0.05 % cream Apply thin film to hyperpigmented areas at night   [DISCONTINUED] erythromycin ophthalmic ointment Place 1 Application into the left eye at bedtime.   No facility-administered medications prior to visit.   Reviewed past medical and social history.   ROS per HPI above      Objective:  BP (!) 140/70   Pulse 81   Temp 98.2 F (36.8 C) (Temporal)   Resp 16   Ht 5\' 5"  (1.651 m)   Wt 146 lb (66.2 kg)   SpO2 98%   BMI 24.30 kg/m      Physical Exam Vitals and nursing note reviewed. Exam conducted with a chaperone present.  Genitourinary:    Labia:        Left: Tenderness and lesion present.        Comments: Approximately 1cm cyst on left labia minora, tender, no erythema, no drainage. Neurological:     Mental Status: She is alert.     No results found for any visits on 10/25/22.    Assessment & Plan:    Problem List Items Addressed This Visit   None Visit Diagnoses     Infected cyst of Bartholin's gland duct    -  Primary   Relevant Medications   clindamycin (CLEOCIN) 300 MG capsule  Elevated BP without diagnosis of hypertension         We discussed use of keflex vs bactrim vs augmentin based on bacterial coverage. We also reviewed her previous renal function. She declined to take above medications due to fear of possible renal toxicity. She agreed to take clindamycin.  Return if symptoms worsen or fail to improve.     Alysia Penna, NP

## 2022-10-25 NOTE — Patient Instructions (Signed)
Continue sitz bathe F/up with pcp if no improvement in 2weeks Monitor BP daily in AM, call pcp if BP remains >140/80

## 2022-11-01 DIAGNOSIS — J3089 Other allergic rhinitis: Secondary | ICD-10-CM | POA: Diagnosis not present

## 2022-11-01 DIAGNOSIS — J3081 Allergic rhinitis due to animal (cat) (dog) hair and dander: Secondary | ICD-10-CM | POA: Diagnosis not present

## 2022-11-01 DIAGNOSIS — J301 Allergic rhinitis due to pollen: Secondary | ICD-10-CM | POA: Diagnosis not present

## 2022-11-08 DIAGNOSIS — J3089 Other allergic rhinitis: Secondary | ICD-10-CM | POA: Diagnosis not present

## 2022-11-08 DIAGNOSIS — J3081 Allergic rhinitis due to animal (cat) (dog) hair and dander: Secondary | ICD-10-CM | POA: Diagnosis not present

## 2022-11-08 DIAGNOSIS — J301 Allergic rhinitis due to pollen: Secondary | ICD-10-CM | POA: Diagnosis not present

## 2022-11-14 ENCOUNTER — Other Ambulatory Visit: Payer: Commercial Managed Care - PPO

## 2022-11-15 DIAGNOSIS — J301 Allergic rhinitis due to pollen: Secondary | ICD-10-CM | POA: Diagnosis not present

## 2022-11-15 DIAGNOSIS — J3081 Allergic rhinitis due to animal (cat) (dog) hair and dander: Secondary | ICD-10-CM | POA: Diagnosis not present

## 2022-11-15 DIAGNOSIS — J3089 Other allergic rhinitis: Secondary | ICD-10-CM | POA: Diagnosis not present

## 2022-11-20 DIAGNOSIS — J3081 Allergic rhinitis due to animal (cat) (dog) hair and dander: Secondary | ICD-10-CM | POA: Diagnosis not present

## 2022-11-20 DIAGNOSIS — J3089 Other allergic rhinitis: Secondary | ICD-10-CM | POA: Diagnosis not present

## 2022-11-20 DIAGNOSIS — J301 Allergic rhinitis due to pollen: Secondary | ICD-10-CM | POA: Diagnosis not present

## 2022-12-07 DIAGNOSIS — J301 Allergic rhinitis due to pollen: Secondary | ICD-10-CM | POA: Diagnosis not present

## 2022-12-07 DIAGNOSIS — J3089 Other allergic rhinitis: Secondary | ICD-10-CM | POA: Diagnosis not present

## 2022-12-07 DIAGNOSIS — J3081 Allergic rhinitis due to animal (cat) (dog) hair and dander: Secondary | ICD-10-CM | POA: Diagnosis not present

## 2022-12-14 ENCOUNTER — Encounter: Payer: Self-pay | Admitting: Internal Medicine

## 2022-12-19 ENCOUNTER — Inpatient Hospital Stay: Payer: Commercial Managed Care - PPO | Attending: Hematology | Admitting: Hematology

## 2022-12-19 ENCOUNTER — Encounter: Payer: Self-pay | Admitting: Hematology

## 2022-12-19 ENCOUNTER — Other Ambulatory Visit: Payer: Self-pay

## 2022-12-19 ENCOUNTER — Other Ambulatory Visit (HOSPITAL_COMMUNITY): Payer: Self-pay

## 2022-12-19 VITALS — BP 125/81 | HR 84 | Temp 97.9°F | Resp 20 | Wt 148.4 lb

## 2022-12-19 DIAGNOSIS — N951 Menopausal and female climacteric states: Secondary | ICD-10-CM | POA: Diagnosis not present

## 2022-12-19 DIAGNOSIS — Z803 Family history of malignant neoplasm of breast: Secondary | ICD-10-CM | POA: Insufficient documentation

## 2022-12-19 DIAGNOSIS — R7303 Prediabetes: Secondary | ICD-10-CM | POA: Diagnosis not present

## 2022-12-19 DIAGNOSIS — Z1501 Genetic susceptibility to malignant neoplasm of breast: Secondary | ICD-10-CM | POA: Insufficient documentation

## 2022-12-19 DIAGNOSIS — R9230 Dense breasts, unspecified: Secondary | ICD-10-CM | POA: Diagnosis not present

## 2022-12-19 MED ORDER — TAMOXIFEN CITRATE 10 MG PO TABS
5.0000 mg | ORAL_TABLET | Freq: Two times a day (BID) | ORAL | 1 refills | Status: DC
  Filled 2022-12-19: qty 15, 15d supply, fill #0

## 2022-12-19 MED ORDER — TAMOXIFEN CITRATE 10 MG PO TABS
5.0000 mg | ORAL_TABLET | Freq: Every day | ORAL | 1 refills | Status: DC
  Filled 2022-12-19: qty 15, 30d supply, fill #0

## 2022-12-19 NOTE — Progress Notes (Signed)
[facility]   [phone] [phone]   Clinic Follow up Note   Patient Care Team: Pincus Sanes, MD as PCP - General (Internal Medicine) [place], [facility] Lowella Bandy., MD (Urology) Donzetta Starch, MD (Dermatology)  Date of Service:  12/19/2022  CHIEF COMPLAINT: f/u of high risk of breast cancer   Assessment and Plan    High Risk for Breast Cancer   Family history of breast cancer (mother and sister diagnosed in their sixties). Patient has dense breast tissue. Genetic testing negative. Currently on bioidentical hormone patch for menopausal symptoms, which slightly increases risk of breat cancer. Discussed potential use of low-dose Tamoxifen as a chemo-preventive measure, but also discussed potential side effects.  After lengthy discussion, she agrees to try tamoxifen 5 mg daily, if she tolerates, will proceed for 3 years. -Consider reducing or discontinuing bioidentical hormone patch over the next six months.   -Consider starting low-dose Tamoxifen once hormone therapy is stabilized or discontinued.   -Schedule mammogram for December 2024.  If her insurance approves, we will consider mammogram every 6 months. -She cannot do screening breast MRI due to the IV contrast, since she has only 1 kidney. -Continue self-exams and annual physician exams.   -Follow-up in one year, with labs if Tamoxifen is started.    Alcohol Consumption   Patient consumes 2-3 glasses of wine 3 nights a week. Discussed potential health risks, including small risk for breast cancer and liver disease.   -Reduce alcohol consumption, aiming for occasional use only.    Menopausal Symptoms   Patient experiences mood swings and painful intercourse without hormone therapy. Currently using bioidentical hormone patch twice a week.   -Consider reducing frequency of hormone patch use over the next six months to assess body's adaptation.    Prediabetes   Patient is prediabetic and working on  diet and exercise.   -Continue diet and exercise efforts to manage prediabetes.     Plan -I called in tamoxifen 5 mg daily for her, she may try.  She may also try to gradually wean off her vision patch if her body tolerates. -Lab and follow-up in 1 year    Discussed the use of AI scribe software for clinical note transcription with the patient, who gave verbal consent to proceed.  History of Present Illness   A 67 year old female with a high risk for breast cancer due to family history and dense breasts presents for a follow-up visit. She has a history of multiple surgeries, including a full hysterectomy due to endometriosis, left ureter UPJ obstruction leading to kidney removal, gallbladder removal, and appendix removal. She has been on a bioidentical hormone patch Rollene Fare) containing estrogen for menopause symptoms since her hysterectomy. She reports significant mood swings and painful intercourse when she tried to stop the hormone replacement therapy seven years ago. She drinks wine 2-3 times a week and is prediabetic. She has not noticed any changes in her breasts and does not perform regular self-exams. She is due for a mammogram in December.         All other systems were reviewed with the patient and are negative.  MEDICAL HISTORY:  Past Medical History:  Diagnosis Date   Allergic rhinitis    dogs, dust, tree and ragweed   Family history of breast cancer    Family history of colon cancer    Family history of melanoma    Family history of ovarian cancer    Heart palpitations 10/13/2013   History of  chickenpox    History of gallstones    Osteopenia    dexa 11/09 -1.4 spine, -1.2 hip   Pineal gland cyst    incidental finding   Primiparous    Single kidney    left removed cong UPJ obstruction;functioning at 66 %    SURGICAL HISTORY: Past Surgical History:  Procedure Laterality Date   ABDOMINAL HYSTERECTOMY  1996   age 61 total severe endometriosis   APPENDECTOMY  2006    CHOLECYSTECTOMY  2001   COLONOSCOPY  04/01/2008   left kidney  2002   removed congential UPJ obstruction    TONSILLECTOMY  1965    I have reviewed the social history and family history with the patient and they are unchanged from previous note.  ALLERGIES:  is allergic to anesthetics, amide; clarithromycin; and trazodone and nefazodone.  MEDICATIONS:  Current Outpatient Medications  Medication Sig Dispense Refill   CALCIUM PO Take by mouth.     cetirizine (ZYRTEC ALLERGY) 10 MG tablet Take 1 tablet (10 mg total) by mouth daily. 90 tablet 3   clindamycin (CLEOCIN) 300 MG capsule Take 1 capsule (300 mg total) by mouth 3 (three) times daily. 15 capsule 0   EPINEPHrine 0.3 mg/0.3 mL IJ SOAJ injection Inject 0.3 mg into the muscle as needed for anaphylaxis. 1 each 2   estradiol (VIVELLE-DOT) 0.025 MG/24HR PLACE 1 PATCH ONTO THE SKIN 2 TIMES A WEEK. 24 patch 3   hydrocortisone (ANUSOL-HC) 25 MG suppository Place 1 suppository (25 mg total) rectally 2 (two) times daily as needed for hemorrhoids or anal itching. 12 suppository 5   hydrocortisone (PROCTOZONE-HC) 2.5 % rectal cream Place 1 Application rectally 2 (two) times daily as needed. 28 g 2   metroNIDAZOLE (METROGEL) 0.75 % gel Apply daily to affected area of face 45 g 1   metroNIDAZOLE (METROGEL) 0.75 % gel Apply small amount topically to face once daily. 45 g 2   Multiple Vitamin (MULTIVITAMIN) tablet Take 1 tablet by mouth daily.     neomycin-polymyxin-dexamethasone (MAXITROL) 0.1 % ophthalmic suspension Place 1 drop into affected eye(s) 3 (three) times daily. 5 mL 0   pimecrolimus (ELIDEL) 1 % cream Apply to affected area as directed. 60 g 1   S-Adenosylmethionine (SAM-E PO) Take by mouth.     tamoxifen (NOLVADEX) 10 MG tablet Take 0.5 tablets (5 mg total) by mouth daily. 15 tablet 1   traZODone (DESYREL) 50 MG tablet Take 1.5-2 tablets (75-100 mg total) by mouth at bedtime. 180 tablet 3   tretinoin (RETIN-A) 0.05 % cream Apply thin  film to hyperpigmented areas at night 45 g 1   No current facility-administered medications for this visit.    PHYSICAL EXAMINATION: ECOG PERFORMANCE STATUS: 0 - Asymptomatic  Vitals:   12/19/22 1117  BP: 125/81  Pulse: 84  Resp: 20  Temp: 97.9 F (36.6 C)  SpO2: 100%   Wt Readings from Last 3 Encounters:  12/19/22 148 lb 6.4 oz (67.3 kg)  10/25/22 146 lb (66.2 kg)  04/17/22 150 lb (68 kg)     GENERAL:alert, no distress and comfortable SKIN: skin color, texture, turgor are normal, no rashes or significant lesions EYES: normal, Conjunctiva are pink and non-injected, sclera clear NECK: supple, thyroid normal size, non-tender, without nodularity LYMPH:  no palpable lymphadenopathy in the cervical, axillary  LUNGS: clear to auscultation and percussion with normal breathing effort HEART: regular rate & rhythm and no murmurs and no lower extremity edema ABDOMEN:abdomen soft, non-tender and normal bowel sounds Musculoskeletal:no  cyanosis of digits and no clubbing  NEURO: alert & oriented x 3 with fluent speech, no focal motor/sensory deficits Breasts: Breast inspection showed them to be symmetrical with no nipple discharge. Palpation of the breasts and axilla revealed no obvious mass that I could appreciate.  Her left breast nipple is slightly flat.   LABORATORY DATA:  I have reviewed the data as listed    Latest Ref Rng & Units 04/03/2022    8:14 AM 01/21/2021    7:37 AM 11/27/2019    8:08 AM  CBC  WBC 4.0 - 10.5 K/uL 7.2  7.1  7.2   Hemoglobin 12.0 - 15.0 g/dL 65.7  84.6  96.2   Hematocrit 36.0 - 46.0 % 40.7  42.7  41.0   Platelets 150.0 - 400.0 K/uL 309.0  268.0  269.0         Latest Ref Rng & Units 04/03/2022    8:14 AM 01/21/2021    7:37 AM 11/27/2019    8:08 AM  CMP  Glucose 70 - 99 mg/dL 86  92  84    84   BUN 6 - 23 mg/dL 13  16  16    16    Creatinine 0.40 - 1.20 mg/dL 9.52  8.41  3.24    4.01   Sodium 135 - 145 mEq/L 139  139  138    138   Potassium 3.5 -  5.1 mEq/L 4.6  4.2  4.6    4.6   Chloride 96 - 112 mEq/L 106  105  105    105   CO2 19 - 32 mEq/L 27  25  27    27    Calcium 8.4 - 10.5 mg/dL 9.5  9.6  9.1    9.1   Total Protein 6.0 - 8.3 g/dL 6.6  7.2  6.9   Total Bilirubin 0.2 - 1.2 mg/dL 0.5  0.5  0.5   Alkaline Phos 39 - 117 U/L 66  61  68   AST 0 - 37 U/L 19  18  19    ALT 0 - 35 U/L 22  21  23        RADIOGRAPHIC STUDIES: I have personally reviewed the radiological images as listed and agreed with the findings in the report. No results found.    No orders of the defined types were placed in this encounter.  All questions were answered. The patient knows to call the clinic with any problems, questions or concerns. No barriers to learning was detected. The total time spent in the appointment was 40 minutes.     Malachy Mood, MD 12/19/2022

## 2022-12-19 NOTE — Addendum Note (Signed)
Addended by: Janan Ridge L on: 12/19/2022 03:04 PM   Modules accepted: Orders

## 2022-12-20 ENCOUNTER — Encounter: Payer: Self-pay | Admitting: Hematology

## 2022-12-20 ENCOUNTER — Other Ambulatory Visit: Payer: Self-pay

## 2022-12-20 DIAGNOSIS — Z803 Family history of malignant neoplasm of breast: Secondary | ICD-10-CM

## 2022-12-20 DIAGNOSIS — Z1239 Encounter for other screening for malignant neoplasm of breast: Secondary | ICD-10-CM

## 2022-12-27 DIAGNOSIS — J3089 Other allergic rhinitis: Secondary | ICD-10-CM | POA: Diagnosis not present

## 2022-12-27 DIAGNOSIS — J3081 Allergic rhinitis due to animal (cat) (dog) hair and dander: Secondary | ICD-10-CM | POA: Diagnosis not present

## 2022-12-27 DIAGNOSIS — J301 Allergic rhinitis due to pollen: Secondary | ICD-10-CM | POA: Diagnosis not present

## 2023-01-12 DIAGNOSIS — N6453 Retraction of nipple: Secondary | ICD-10-CM | POA: Diagnosis not present

## 2023-01-12 DIAGNOSIS — J3081 Allergic rhinitis due to animal (cat) (dog) hair and dander: Secondary | ICD-10-CM | POA: Diagnosis not present

## 2023-01-12 DIAGNOSIS — N6342 Unspecified lump in left breast, subareolar: Secondary | ICD-10-CM | POA: Diagnosis not present

## 2023-01-12 DIAGNOSIS — J301 Allergic rhinitis due to pollen: Secondary | ICD-10-CM | POA: Diagnosis not present

## 2023-01-12 DIAGNOSIS — J3089 Other allergic rhinitis: Secondary | ICD-10-CM | POA: Diagnosis not present

## 2023-01-15 ENCOUNTER — Encounter: Payer: Self-pay | Admitting: Internal Medicine

## 2023-01-16 ENCOUNTER — Other Ambulatory Visit (HOSPITAL_COMMUNITY): Payer: Self-pay

## 2023-01-16 ENCOUNTER — Other Ambulatory Visit: Payer: Self-pay

## 2023-01-16 MED ORDER — HYDROCORTISONE ACETATE 25 MG RE SUPP
25.0000 mg | Freq: Two times a day (BID) | RECTAL | 5 refills | Status: DC | PRN
  Filled 2023-01-16 – 2023-03-21 (×2): qty 24, 12d supply, fill #0
  Filled 2024-01-13: qty 24, 12d supply, fill #1

## 2023-01-22 ENCOUNTER — Encounter: Payer: Self-pay | Admitting: Internal Medicine

## 2023-01-25 ENCOUNTER — Other Ambulatory Visit: Payer: Self-pay | Admitting: Radiology

## 2023-01-25 DIAGNOSIS — N6323 Unspecified lump in the left breast, lower outer quadrant: Secondary | ICD-10-CM | POA: Diagnosis not present

## 2023-01-25 DIAGNOSIS — N6332 Unspecified lump in axillary tail of the left breast: Secondary | ICD-10-CM | POA: Diagnosis not present

## 2023-01-26 ENCOUNTER — Other Ambulatory Visit (HOSPITAL_COMMUNITY): Payer: Self-pay

## 2023-01-26 ENCOUNTER — Other Ambulatory Visit: Payer: Self-pay

## 2023-01-26 DIAGNOSIS — J3081 Allergic rhinitis due to animal (cat) (dog) hair and dander: Secondary | ICD-10-CM | POA: Diagnosis not present

## 2023-01-26 DIAGNOSIS — J3089 Other allergic rhinitis: Secondary | ICD-10-CM | POA: Diagnosis not present

## 2023-01-26 DIAGNOSIS — J301 Allergic rhinitis due to pollen: Secondary | ICD-10-CM | POA: Diagnosis not present

## 2023-01-26 LAB — SURGICAL PATHOLOGY

## 2023-01-26 MED ORDER — EPINEPHRINE 0.3 MG/0.3ML IJ SOAJ
INTRAMUSCULAR | 1 refills | Status: AC
  Filled 2023-01-26: qty 2, 30d supply, fill #0

## 2023-01-30 ENCOUNTER — Encounter: Payer: Self-pay | Admitting: Internal Medicine

## 2023-02-12 DIAGNOSIS — J3089 Other allergic rhinitis: Secondary | ICD-10-CM | POA: Diagnosis not present

## 2023-02-12 DIAGNOSIS — J3081 Allergic rhinitis due to animal (cat) (dog) hair and dander: Secondary | ICD-10-CM | POA: Diagnosis not present

## 2023-02-12 DIAGNOSIS — J301 Allergic rhinitis due to pollen: Secondary | ICD-10-CM | POA: Diagnosis not present

## 2023-02-28 DIAGNOSIS — J3081 Allergic rhinitis due to animal (cat) (dog) hair and dander: Secondary | ICD-10-CM | POA: Diagnosis not present

## 2023-02-28 DIAGNOSIS — J3089 Other allergic rhinitis: Secondary | ICD-10-CM | POA: Diagnosis not present

## 2023-02-28 DIAGNOSIS — J301 Allergic rhinitis due to pollen: Secondary | ICD-10-CM | POA: Diagnosis not present

## 2023-03-07 ENCOUNTER — Encounter: Payer: Self-pay | Admitting: Nurse Practitioner

## 2023-03-12 ENCOUNTER — Encounter: Payer: Self-pay | Admitting: Internal Medicine

## 2023-03-12 DIAGNOSIS — R944 Abnormal results of kidney function studies: Secondary | ICD-10-CM | POA: Insufficient documentation

## 2023-03-12 DIAGNOSIS — R7303 Prediabetes: Secondary | ICD-10-CM

## 2023-03-12 DIAGNOSIS — E7849 Other hyperlipidemia: Secondary | ICD-10-CM

## 2023-03-12 NOTE — Telephone Encounter (Signed)
Pt requesting labs prior to physical and would also like kidney panel done

## 2023-03-13 DIAGNOSIS — H524 Presbyopia: Secondary | ICD-10-CM | POA: Diagnosis not present

## 2023-03-20 DIAGNOSIS — J301 Allergic rhinitis due to pollen: Secondary | ICD-10-CM | POA: Diagnosis not present

## 2023-03-20 DIAGNOSIS — J3089 Other allergic rhinitis: Secondary | ICD-10-CM | POA: Diagnosis not present

## 2023-03-20 DIAGNOSIS — J3081 Allergic rhinitis due to animal (cat) (dog) hair and dander: Secondary | ICD-10-CM | POA: Diagnosis not present

## 2023-03-21 ENCOUNTER — Other Ambulatory Visit: Payer: Self-pay | Admitting: Internal Medicine

## 2023-03-21 ENCOUNTER — Other Ambulatory Visit (HOSPITAL_COMMUNITY): Payer: Self-pay

## 2023-03-21 ENCOUNTER — Other Ambulatory Visit: Payer: Self-pay

## 2023-03-26 DIAGNOSIS — L6612 Frontal fibrosing alopecia: Secondary | ICD-10-CM | POA: Diagnosis not present

## 2023-03-28 ENCOUNTER — Encounter: Payer: Commercial Managed Care - PPO | Admitting: Internal Medicine

## 2023-04-03 ENCOUNTER — Other Ambulatory Visit (HOSPITAL_COMMUNITY): Payer: Self-pay

## 2023-04-03 ENCOUNTER — Encounter: Payer: Self-pay | Admitting: Internal Medicine

## 2023-04-03 MED ORDER — TRAZODONE HCL 50 MG PO TABS
75.0000 mg | ORAL_TABLET | Freq: Every day | ORAL | 0 refills | Status: AC
  Filled 2023-04-03: qty 135, 90d supply, fill #0

## 2023-04-04 ENCOUNTER — Other Ambulatory Visit (HOSPITAL_COMMUNITY): Payer: Self-pay

## 2023-04-06 DIAGNOSIS — R052 Subacute cough: Secondary | ICD-10-CM | POA: Diagnosis not present

## 2023-04-06 DIAGNOSIS — J3081 Allergic rhinitis due to animal (cat) (dog) hair and dander: Secondary | ICD-10-CM | POA: Diagnosis not present

## 2023-04-06 DIAGNOSIS — J301 Allergic rhinitis due to pollen: Secondary | ICD-10-CM | POA: Diagnosis not present

## 2023-04-06 DIAGNOSIS — J3089 Other allergic rhinitis: Secondary | ICD-10-CM | POA: Diagnosis not present

## 2023-04-10 ENCOUNTER — Other Ambulatory Visit: Payer: Commercial Managed Care - PPO

## 2023-04-10 DIAGNOSIS — R7303 Prediabetes: Secondary | ICD-10-CM | POA: Diagnosis not present

## 2023-04-10 DIAGNOSIS — R944 Abnormal results of kidney function studies: Secondary | ICD-10-CM | POA: Diagnosis not present

## 2023-04-10 DIAGNOSIS — E7849 Other hyperlipidemia: Secondary | ICD-10-CM

## 2023-04-11 LAB — COMPREHENSIVE METABOLIC PANEL
ALT: 26 [IU]/L (ref 0–32)
AST: 20 [IU]/L (ref 0–40)
Albumin: 4.6 g/dL (ref 3.9–4.9)
Alkaline Phosphatase: 88 [IU]/L (ref 44–121)
BUN/Creatinine Ratio: 13 (ref 12–28)
BUN: 13 mg/dL (ref 8–27)
Bilirubin Total: 0.5 mg/dL (ref 0.0–1.2)
CO2: 24 mmol/L (ref 20–29)
Calcium: 9.9 mg/dL (ref 8.7–10.3)
Chloride: 104 mmol/L (ref 96–106)
Creatinine, Ser: 0.99 mg/dL (ref 0.57–1.00)
Globulin, Total: 2.2 g/dL (ref 1.5–4.5)
Glucose: 85 mg/dL (ref 70–99)
Potassium: 4.6 mmol/L (ref 3.5–5.2)
Sodium: 142 mmol/L (ref 134–144)
Total Protein: 6.8 g/dL (ref 6.0–8.5)
eGFR: 63 mL/min/{1.73_m2} (ref >59)

## 2023-04-11 LAB — URINALYSIS, ROUTINE W REFLEX MICROSCOPIC
Bilirubin, UA: NEGATIVE
Glucose, UA: NEGATIVE
Ketones, UA: NEGATIVE
Leukocytes,UA: NEGATIVE
Nitrite, UA: NEGATIVE
Protein,UA: NEGATIVE
RBC, UA: NEGATIVE
Specific Gravity, UA: 1.016 (ref 1.005–1.030)
Urobilinogen, Ur: 0.2 mg/dL (ref 0.2–1.0)
pH, UA: 6 (ref 5.0–7.5)

## 2023-04-11 LAB — CBC WITH DIFFERENTIAL/PLATELET
Basophils Absolute: 0.1 10*3/uL (ref 0.0–0.2)
Basos: 1 %
EOS (ABSOLUTE): 0.3 10*3/uL (ref 0.0–0.4)
Eos: 4 %
Hematocrit: 44.4 % (ref 34.0–46.6)
Hemoglobin: 14.5 g/dL (ref 11.1–15.9)
Immature Grans (Abs): 0 10*3/uL (ref 0.0–0.1)
Immature Granulocytes: 0 %
Lymphocytes Absolute: 3.6 10*3/uL — ABNORMAL HIGH (ref 0.7–3.1)
Lymphs: 47 %
MCH: 32.8 pg (ref 26.6–33.0)
MCHC: 32.7 g/dL (ref 31.5–35.7)
MCV: 101 fL — ABNORMAL HIGH (ref 79–97)
Monocytes Absolute: 0.5 10*3/uL (ref 0.1–0.9)
Monocytes: 7 %
Neutrophils Absolute: 3.2 10*3/uL (ref 1.4–7.0)
Neutrophils: 41 %
Platelets: 284 10*3/uL (ref 150–450)
RBC: 4.42 x10E6/uL (ref 3.77–5.28)
RDW: 12.8 % (ref 11.7–15.4)
WBC: 7.7 10*3/uL (ref 3.4–10.8)

## 2023-04-11 LAB — PHOSPHORUS: Phosphorus: 3.7 mg/dL (ref 3.0–4.3)

## 2023-04-11 LAB — VITAMIN D 25 HYDROXY (VIT D DEFICIENCY, FRACTURES): Vit D, 25-Hydroxy: 34.7 ng/mL (ref 30.0–100.0)

## 2023-04-11 LAB — LIPID PANEL
Chol/HDL Ratio: 3 {ratio} (ref 0.0–4.4)
Cholesterol, Total: 219 mg/dL — ABNORMAL HIGH (ref 100–199)
HDL: 73 mg/dL (ref >39)
LDL Chol Calc (NIH): 125 mg/dL — ABNORMAL HIGH (ref 0–99)
Triglycerides: 121 mg/dL (ref 0–149)
VLDL Cholesterol Cal: 21 mg/dL (ref 5–40)

## 2023-04-11 LAB — TSH: TSH: 3.86 u[IU]/mL (ref 0.450–4.500)

## 2023-04-11 LAB — MICROALBUMIN / CREATININE URINE RATIO
Creatinine, Urine: 126.1 mg/dL
Microalb/Creat Ratio: 4 mg/g{creat} (ref 0–29)
Microalbumin, Urine: 5.3 ug/mL

## 2023-04-11 LAB — HEMOGLOBIN A1C
Est. average glucose Bld gHb Est-mCnc: 111 mg/dL
Hgb A1c MFr Bld: 5.5 % (ref 4.8–5.6)

## 2023-04-17 ENCOUNTER — Encounter: Payer: Self-pay | Admitting: Internal Medicine

## 2023-04-17 DIAGNOSIS — R944 Abnormal results of kidney function studies: Secondary | ICD-10-CM | POA: Diagnosis not present

## 2023-04-17 DIAGNOSIS — L6612 Frontal fibrosing alopecia: Secondary | ICD-10-CM | POA: Diagnosis not present

## 2023-04-17 DIAGNOSIS — N139 Obstructive and reflux uropathy, unspecified: Secondary | ICD-10-CM | POA: Diagnosis not present

## 2023-04-17 DIAGNOSIS — E785 Hyperlipidemia, unspecified: Secondary | ICD-10-CM | POA: Diagnosis not present

## 2023-04-17 NOTE — Progress Notes (Unsigned)
 Subjective:    Patient ID: Maria Kane Hidden, female    DOB: 03-23-1955, 68 y.o.   MRN: [id number]      HPI Sherissa is here for a Physical exam and her chronic medical problems.   Reviewed blood work.    Going off estrogen patch due to increased risk of breast cancer.  She is having some symptoms and wonders what she can take to help with some of the hot flashes, sleep disturbance, etc.    Left knee pain - stage 3 arthritis- not ready for replacement.  Has had shots in past, but that was years ago-not sure what she should do at this point.  The pain is hindering her activity.  Has not seen orthopedics in a while..    Occ hemorrhoids - ext and internal.  She only occasionally gets bleeding.  She does have burning and discomfort.  Steroid suppositories and creams do help.  She does have chronic diarrhea.  She is taking fiber.  Medications and allergies reviewed with patient and updated if appropriate.  Current Outpatient Medications on File Prior to Visit  Medication Sig Dispense Refill   CALCIUM PO Take by mouth.     EPINEPHrine (EPIPEN 2-PAK) 0.3 mg/0.3 mL IJ SOAJ injection Inject as directed as needed 2 each 1   EPINEPHrine 0.3 mg/0.3 mL IJ SOAJ injection Inject 0.3 mg into the muscle as needed for anaphylaxis. 1 each 2   estradiol (VIVELLE-DOT) 0.025 MG/24HR PLACE 1 PATCH ONTO THE SKIN 2 TIMES A WEEK. 24 patch 3   hydrocortisone (ANUSOL-HC) 25 MG suppository Unwrap and insert 1 suppository (25 mg total) rectally 2 (two) times daily as needed for hemorrhoids or anal itching. 24 suppository 5   hydrocortisone (PROCTOZONE-HC) 2.5 % rectal cream Place 1 Application rectally 2 (two) times daily as needed. 28 g 2   Multiple Vitamin (MULTIVITAMIN) tablet Take 1 tablet by mouth daily.     pimecrolimus (ELIDEL) 1 % cream Apply to affected area as directed. 60 g 1   traZODone (DESYREL) 50 MG tablet Take 1.5 tablets (75 mg total) by mouth at bedtime. 135 tablet 0   No current  facility-administered medications on file prior to visit.    Review of Systems  Constitutional:  Negative for fever.  Eyes:  Negative for visual disturbance.  Respiratory:  Negative for cough, shortness of breath and wheezing.   Cardiovascular:  Positive for palpitations (occ). Negative for chest pain and leg swelling.  Gastrointestinal:  Positive for diarrhea (chronic- taking fiber). Negative for abdominal pain, blood in stool and constipation.       No gerd  Genitourinary:  Negative for dysuria.  Musculoskeletal:  Positive for arthralgias (left knee). Negative for back pain.  Skin:  Negative for rash.  Neurological:  Negative for light-headedness and headaches.  Psychiatric/Behavioral:  Negative for dysphoric mood. The patient is nervous/anxious.        Objective:   Vitals:   04/18/23 1340  BP: 134/82  Pulse: 76  SpO2: 99%   Filed Weights   04/18/23 1340  Weight: 151 lb 3.2 oz (68.6 kg)   Body mass index is 25.16 kg/m.  BP Readings from Last 3 Encounters:  04/18/23 134/82  12/19/22 125/81  10/25/22 (!) 140/70    Wt Readings from Last 3 Encounters:  04/18/23 151 lb 3.2 oz (68.6 kg)  12/19/22 148 lb 6.4 oz (67.3 kg)  10/25/22 146 lb (66.2 kg)       Physical Exam Constitutional: She appears well-developed  and well-nourished. No distress.  HENT:  Head: Normocephalic and atraumatic.  Right Ear: External ear normal. Normal ear canal and TM Left Ear: External ear normal.  Normal ear canal and TM Mouth/Throat: Oropharynx is clear and moist.  Eyes: Conjunctivae normal.  Neck: Neck supple. No tracheal deviation present. No thyromegaly present.  No carotid bruit  Cardiovascular: Normal rate, regular rhythm and normal heart sounds.   No murmur heard.  No edema. Pulmonary/Chest: Effort normal and breath sounds normal. No respiratory distress. She has no wheezes. She has no rales.  Breast: deferred   Abdominal: Soft. She exhibits no distension. There is no tenderness.   Lymphadenopathy: She has no cervical adenopathy.  Skin: Skin is warm and dry. She is not diaphoretic.  Psychiatric: She has a normal mood and affect. Her behavior is normal.     Lab Results  Component Value Date   WBC 7.7 04/10/2023   HGB 14.5 04/10/2023   HCT 44.4 04/10/2023   PLT 284 04/10/2023   GLUCOSE 85 04/10/2023   CHOL 219 (H) 04/10/2023   TRIG 121 04/10/2023   HDL 73 04/10/2023   LDLDIRECT 114.0 02/17/2015   LDLCALC 125 (H) 04/10/2023   ALT 26 04/10/2023   AST 20 04/10/2023   NA 142 04/10/2023   K 4.6 04/10/2023   CL 104 04/10/2023   CREATININE 0.99 04/10/2023   BUN 13 04/10/2023   CO2 24 04/10/2023   TSH 3.860 04/10/2023   INR 0.95 10/12/2013   HGBA1C 5.5 04/10/2023   MICROALBUR <0.7 02/08/2021         Assessment & Plan:   Physical exam: Screening blood work  ordered Exercise  peleton, walking, yoga Weight  normal Substance abuse  none   Reviewed recommended immunizations.   Health Maintenance  Topic Date Due   COVID-19 Vaccine (5 - 2024-25 season) 05/04/2023 (Originally 10/22/2022)   MAMMOGRAM  01/27/2024   DEXA SCAN  01/27/2024   DTaP/Tdap/Td (3 - Td or Tdap) 02/05/2024   Colonoscopy  11/28/2028   Pneumonia Vaccine 30+ Years old  Completed   INFLUENZA VACCINE  Completed   Hepatitis C Screening  Completed   Zoster Vaccines- Shingrix  Completed   HPV VACCINES  Aged Out          See Problem List for Assessment and Plan of chronic medical problems.

## 2023-04-18 ENCOUNTER — Other Ambulatory Visit: Payer: Self-pay

## 2023-04-18 ENCOUNTER — Ambulatory Visit (INDEPENDENT_AMBULATORY_CARE_PROVIDER_SITE_OTHER): Payer: Commercial Managed Care - PPO | Admitting: Internal Medicine

## 2023-04-18 ENCOUNTER — Other Ambulatory Visit (HOSPITAL_COMMUNITY): Payer: Self-pay

## 2023-04-18 VITALS — BP 134/82 | HR 76 | Ht 65.0 in | Wt 151.2 lb

## 2023-04-18 DIAGNOSIS — Z905 Acquired absence of kidney: Secondary | ICD-10-CM

## 2023-04-18 DIAGNOSIS — R944 Abnormal results of kidney function studies: Secondary | ICD-10-CM

## 2023-04-18 DIAGNOSIS — R7303 Prediabetes: Secondary | ICD-10-CM

## 2023-04-18 DIAGNOSIS — Z9189 Other specified personal risk factors, not elsewhere classified: Secondary | ICD-10-CM

## 2023-04-18 DIAGNOSIS — M85862 Other specified disorders of bone density and structure, left lower leg: Secondary | ICD-10-CM | POA: Diagnosis not present

## 2023-04-18 DIAGNOSIS — K649 Unspecified hemorrhoids: Secondary | ICD-10-CM | POA: Diagnosis not present

## 2023-04-18 DIAGNOSIS — Z0000 Encounter for general adult medical examination without abnormal findings: Secondary | ICD-10-CM

## 2023-04-18 DIAGNOSIS — M85861 Other specified disorders of bone density and structure, right lower leg: Secondary | ICD-10-CM

## 2023-04-18 DIAGNOSIS — G4709 Other insomnia: Secondary | ICD-10-CM

## 2023-04-18 DIAGNOSIS — E7849 Other hyperlipidemia: Secondary | ICD-10-CM | POA: Diagnosis not present

## 2023-04-18 MED ORDER — VENLAFAXINE HCL ER 37.5 MG PO CP24
37.5000 mg | ORAL_CAPSULE | Freq: Every day | ORAL | 3 refills | Status: AC
  Filled 2023-04-18: qty 90, 90d supply, fill #0

## 2023-04-18 NOTE — Assessment & Plan Note (Signed)
 Chronic Elevated risk of breast cancer She is slowly coming off the estrogen patch She has decreased her alcohol She is exercising regularly and eating healthy She has not yet started tamoxifen and is still thinking about starting it

## 2023-04-18 NOTE — Assessment & Plan Note (Signed)
 Chronic A1c 5.5%-improved Continue low sugar/carbohydrate diet Continue regular exercise

## 2023-04-18 NOTE — Assessment & Plan Note (Signed)
Chronic Following with nephrology GFR stable 

## 2023-04-18 NOTE — Assessment & Plan Note (Signed)
 Chronic Dexa up to date Continue regular exercise Continue calcium 1 pill daily and vitamin d daily

## 2023-04-18 NOTE — Assessment & Plan Note (Signed)
 Chronic Fairly controlled-is coming off the estrogen so has had some more sleep issues Continue trazodone 75 mg nightly

## 2023-04-18 NOTE — Assessment & Plan Note (Signed)
 Chronic Cholesterol well-controlled, but LDL slightly higher than in the past She will work on her diet to see if that helps Continue healthy diet, regular exercise

## 2023-04-18 NOTE — Assessment & Plan Note (Signed)
 States she has internal and external hemorrhoids Has a burning sensation a lot, but only occasional bleeding Has chronic diarrhea-taking some fiber.  Advised increasing fiber intake to see if that helps better control diarrhea Referral back to GI for further treatment of hemorrhoids steroids and cream for now

## 2023-04-18 NOTE — Patient Instructions (Addendum)
 Medications changes include :   Effexor 37.5 mg daily    A referral was ordered for GI.    Return in about 1 year (around 04/17/2024) for Physical Exam.    Health Maintenance, Female Adopting a healthy lifestyle and getting preventive care are important in promoting health and wellness. Ask your health care provider about: The right schedule for you to have regular tests and exams. Things you can do on your own to prevent diseases and keep yourself healthy. What should I know about diet, weight, and exercise? Eat a healthy diet  Eat a diet that includes plenty of vegetables, fruits, low-fat dairy products, and lean protein. Do not eat a lot of foods that are high in solid fats, added sugars, or sodium. Maintain a healthy weight Body mass index (BMI) is used to identify weight problems. It estimates body fat based on height and weight. Your health care provider can help determine your BMI and help you achieve or maintain a healthy weight. Get regular exercise Get regular exercise. This is one of the most important things you can do for your health. Most adults should: Exercise for at least 150 minutes each week. The exercise should increase your heart rate and make you sweat (moderate-intensity exercise). Do strengthening exercises at least twice a week. This is in addition to the moderate-intensity exercise. Spend less time sitting. Even light physical activity can be beneficial. Watch cholesterol and blood lipids Have your blood tested for lipids and cholesterol at 66 years of age, then have this test every 5 years. Have your cholesterol levels checked more often if: Your lipid or cholesterol levels are high. You are older than 66 years of age. You are at high risk for heart disease. What should I know about cancer screening? Depending on your health history and family history, you may need to have cancer screening at various ages. This may include screening for: Breast  cancer. Cervical cancer. Colorectal cancer. Skin cancer. Lung cancer. What should I know about heart disease, diabetes, and high blood pressure? Blood pressure and heart disease High blood pressure causes heart disease and increases the risk of stroke. This is more likely to develop in people who have high blood pressure readings or are overweight. Have your blood pressure checked: Every 3-5 years if you are 29-13 years of age. Every year if you are 86 years old or older. Diabetes Have regular diabetes screenings. This checks your fasting blood sugar level. Have the screening done: Once every three years after age 61 if you are at a normal weight and have a low risk for diabetes. More often and at a younger age if you are overweight or have a high risk for diabetes. What should I know about preventing infection? Hepatitis B If you have a higher risk for hepatitis B, you should be screened for this virus. Talk with your health care provider to find out if you are at risk for hepatitis B infection. Hepatitis C Testing is recommended for: Everyone born from [birth year] through 1963. Anyone with known risk factors for hepatitis C. Sexually transmitted infections (STIs) Get screened for STIs, including gonorrhea and chlamydia, if: You are sexually active and are younger than 66 years of age. You are older than 66 years of age and your health care provider tells you that you are at risk for this type of infection. Your sexual activity has changed since you were last screened, and you are at increased risk for chlamydia  or gonorrhea. Ask your health care provider if you are at risk. Ask your health care provider about whether you are at high risk for HIV. Your health care provider may recommend a prescription medicine to help prevent HIV infection. If you choose to take medicine to prevent HIV, you should first get tested for HIV. You should then be tested every 3 months for as long as you are taking  the medicine. Pregnancy If you are about to stop having your period (premenopausal) and you may become pregnant, seek counseling before you get pregnant. Take 400 to 800 micrograms (mcg) of folic acid every day if you become pregnant. Ask for birth control (contraception) if you want to prevent pregnancy. Osteoporosis and menopause Osteoporosis is a disease in which the bones lose minerals and strength with aging. This can result in bone fractures. If you are 49 years old or older, or if you are at risk for osteoporosis and fractures, ask your health care provider if you should: Be screened for bone loss. Take a calcium or vitamin D supplement to lower your risk of fractures. Be given hormone replacement therapy (HRT) to treat symptoms of menopause. Follow these instructions at home: Alcohol use Do not drink alcohol if: Your health care provider tells you not to drink. You are pregnant, may be pregnant, or are planning to become pregnant. If you drink alcohol: Limit how much you have to: 0-1 drink a day. Know how much alcohol is in your drink. In the [place] one drink equals one 12 oz bottle of beer (355 mL), one 5 oz glass of wine (148 mL), or one 1 oz glass of hard liquor (44 mL). Lifestyle Do not use any products that contain nicotine or tobacco. These products include cigarettes, chewing tobacco, and vaping devices, such as e-cigarettes. If you need help quitting, ask your health care provider. Do not use street drugs. Do not share needles. Ask your health care provider for help if you need support or information about quitting drugs. General instructions Schedule regular health, dental, and eye exams. Stay current with your vaccines. Tell your health care provider if: You often feel depressed. You have ever been abused or do not feel safe at home. Summary Adopting a healthy lifestyle and getting preventive care are important in promoting health and wellness. Follow your health  care provider's instructions about healthy diet, exercising, and getting tested or screened for diseases. Follow your health care provider's instructions on monitoring your cholesterol and blood pressure. This information is not intended to replace advice given to you by your health care provider. Make sure you discuss any questions you have with your health care provider. Document Revised: 06/28/2020 Document Reviewed: 06/28/2020 Elsevier Patient Education  2024 [employer].

## 2023-04-23 DIAGNOSIS — J3081 Allergic rhinitis due to animal (cat) (dog) hair and dander: Secondary | ICD-10-CM | POA: Diagnosis not present

## 2023-04-23 DIAGNOSIS — J3089 Other allergic rhinitis: Secondary | ICD-10-CM | POA: Diagnosis not present

## 2023-04-23 DIAGNOSIS — J301 Allergic rhinitis due to pollen: Secondary | ICD-10-CM | POA: Diagnosis not present

## 2023-05-08 DIAGNOSIS — J3081 Allergic rhinitis due to animal (cat) (dog) hair and dander: Secondary | ICD-10-CM | POA: Diagnosis not present

## 2023-05-08 DIAGNOSIS — J301 Allergic rhinitis due to pollen: Secondary | ICD-10-CM | POA: Diagnosis not present

## 2023-05-08 DIAGNOSIS — J3089 Other allergic rhinitis: Secondary | ICD-10-CM | POA: Diagnosis not present

## 2023-05-23 DIAGNOSIS — J301 Allergic rhinitis due to pollen: Secondary | ICD-10-CM | POA: Diagnosis not present

## 2023-05-23 DIAGNOSIS — J3081 Allergic rhinitis due to animal (cat) (dog) hair and dander: Secondary | ICD-10-CM | POA: Diagnosis not present

## 2023-05-23 DIAGNOSIS — J3089 Other allergic rhinitis: Secondary | ICD-10-CM | POA: Diagnosis not present

## 2023-05-31 DIAGNOSIS — J3081 Allergic rhinitis due to animal (cat) (dog) hair and dander: Secondary | ICD-10-CM | POA: Diagnosis not present

## 2023-05-31 DIAGNOSIS — J3089 Other allergic rhinitis: Secondary | ICD-10-CM | POA: Diagnosis not present

## 2023-05-31 DIAGNOSIS — J301 Allergic rhinitis due to pollen: Secondary | ICD-10-CM | POA: Diagnosis not present

## 2023-06-07 DIAGNOSIS — J3089 Other allergic rhinitis: Secondary | ICD-10-CM | POA: Diagnosis not present

## 2023-06-07 DIAGNOSIS — J3081 Allergic rhinitis due to animal (cat) (dog) hair and dander: Secondary | ICD-10-CM | POA: Diagnosis not present

## 2023-06-07 DIAGNOSIS — J301 Allergic rhinitis due to pollen: Secondary | ICD-10-CM | POA: Diagnosis not present

## 2023-06-19 DIAGNOSIS — J301 Allergic rhinitis due to pollen: Secondary | ICD-10-CM | POA: Diagnosis not present

## 2023-06-19 DIAGNOSIS — J3089 Other allergic rhinitis: Secondary | ICD-10-CM | POA: Diagnosis not present

## 2023-06-19 DIAGNOSIS — J3081 Allergic rhinitis due to animal (cat) (dog) hair and dander: Secondary | ICD-10-CM | POA: Diagnosis not present

## 2023-07-02 DIAGNOSIS — M170 Bilateral primary osteoarthritis of knee: Secondary | ICD-10-CM | POA: Diagnosis not present

## 2023-07-02 DIAGNOSIS — Z1329 Encounter for screening for other suspected endocrine disorder: Secondary | ICD-10-CM | POA: Diagnosis not present

## 2023-07-02 DIAGNOSIS — Z131 Encounter for screening for diabetes mellitus: Secondary | ICD-10-CM | POA: Diagnosis not present

## 2023-07-02 DIAGNOSIS — G4700 Insomnia, unspecified: Secondary | ICD-10-CM | POA: Diagnosis not present

## 2023-07-02 DIAGNOSIS — R5383 Other fatigue: Secondary | ICD-10-CM | POA: Diagnosis not present

## 2023-07-02 DIAGNOSIS — E559 Vitamin D deficiency, unspecified: Secondary | ICD-10-CM | POA: Diagnosis not present

## 2023-07-02 DIAGNOSIS — Z1321 Encounter for screening for nutritional disorder: Secondary | ICD-10-CM | POA: Diagnosis not present

## 2023-07-02 DIAGNOSIS — N951 Menopausal and female climacteric states: Secondary | ICD-10-CM | POA: Diagnosis not present

## 2023-07-04 DIAGNOSIS — J301 Allergic rhinitis due to pollen: Secondary | ICD-10-CM | POA: Diagnosis not present

## 2023-07-04 DIAGNOSIS — J3089 Other allergic rhinitis: Secondary | ICD-10-CM | POA: Diagnosis not present

## 2023-07-04 DIAGNOSIS — J3081 Allergic rhinitis due to animal (cat) (dog) hair and dander: Secondary | ICD-10-CM | POA: Diagnosis not present

## 2023-07-20 DIAGNOSIS — J3089 Other allergic rhinitis: Secondary | ICD-10-CM | POA: Diagnosis not present

## 2023-07-20 DIAGNOSIS — J301 Allergic rhinitis due to pollen: Secondary | ICD-10-CM | POA: Diagnosis not present

## 2023-07-20 DIAGNOSIS — J3081 Allergic rhinitis due to animal (cat) (dog) hair and dander: Secondary | ICD-10-CM | POA: Diagnosis not present

## 2023-08-08 DIAGNOSIS — J3089 Other allergic rhinitis: Secondary | ICD-10-CM | POA: Diagnosis not present

## 2023-08-08 DIAGNOSIS — J301 Allergic rhinitis due to pollen: Secondary | ICD-10-CM | POA: Diagnosis not present

## 2023-08-08 DIAGNOSIS — J3081 Allergic rhinitis due to animal (cat) (dog) hair and dander: Secondary | ICD-10-CM | POA: Diagnosis not present

## 2023-08-13 ENCOUNTER — Encounter: Payer: Self-pay | Admitting: Internal Medicine

## 2023-08-14 ENCOUNTER — Telehealth: Payer: Self-pay | Admitting: Internal Medicine

## 2023-08-14 DIAGNOSIS — Z0184 Encounter for antibody response examination: Secondary | ICD-10-CM

## 2023-08-14 NOTE — Telephone Encounter (Signed)
 Copied from CRM [id number]. Topic: Clinical - Request for Lab/Test Order >> Aug 14, 2023  1:58 PM Suzen RAMAN wrote: Reason for CRM: patient would like to have orders placed for TITER TEST(MMR). Please contact patient to schedule once orders are placed [phone].   ----------------------------------------------------------------------- From previous Reason for Contact - Scheduling: Patient/patient representative is calling to schedule an appointment. Refer to attachments for appointment information.

## 2023-08-14 NOTE — Telephone Encounter (Signed)
 ordered

## 2023-08-16 ENCOUNTER — Other Ambulatory Visit

## 2023-08-16 DIAGNOSIS — Z0184 Encounter for antibody response examination: Secondary | ICD-10-CM

## 2023-08-17 LAB — MEASLES/MUMPS/RUBELLA IMMUNITY
Mumps IgG: >300 [AU]/ml
Rubella: 1.59 {index}
Rubeola IgG: >300 [AU]/ml

## 2023-08-18 ENCOUNTER — Ambulatory Visit: Payer: Self-pay | Admitting: Internal Medicine

## 2023-08-27 DIAGNOSIS — J3081 Allergic rhinitis due to animal (cat) (dog) hair and dander: Secondary | ICD-10-CM | POA: Diagnosis not present

## 2023-08-27 DIAGNOSIS — J301 Allergic rhinitis due to pollen: Secondary | ICD-10-CM | POA: Diagnosis not present

## 2023-08-27 DIAGNOSIS — J3089 Other allergic rhinitis: Secondary | ICD-10-CM | POA: Diagnosis not present

## 2023-09-11 DIAGNOSIS — J3089 Other allergic rhinitis: Secondary | ICD-10-CM | POA: Diagnosis not present

## 2023-09-11 DIAGNOSIS — J301 Allergic rhinitis due to pollen: Secondary | ICD-10-CM | POA: Diagnosis not present

## 2023-09-11 DIAGNOSIS — J3081 Allergic rhinitis due to animal (cat) (dog) hair and dander: Secondary | ICD-10-CM | POA: Diagnosis not present

## 2023-09-18 DIAGNOSIS — J301 Allergic rhinitis due to pollen: Secondary | ICD-10-CM | POA: Diagnosis not present

## 2023-09-18 DIAGNOSIS — J3081 Allergic rhinitis due to animal (cat) (dog) hair and dander: Secondary | ICD-10-CM | POA: Diagnosis not present

## 2023-09-18 DIAGNOSIS — J3089 Other allergic rhinitis: Secondary | ICD-10-CM | POA: Diagnosis not present

## 2023-09-20 ENCOUNTER — Other Ambulatory Visit: Payer: Self-pay

## 2023-09-20 ENCOUNTER — Other Ambulatory Visit (HOSPITAL_COMMUNITY): Payer: Self-pay

## 2023-09-20 DIAGNOSIS — D2372 Other benign neoplasm of skin of left lower limb, including hip: Secondary | ICD-10-CM | POA: Diagnosis not present

## 2023-09-20 DIAGNOSIS — D2261 Melanocytic nevi of right upper limb, including shoulder: Secondary | ICD-10-CM | POA: Diagnosis not present

## 2023-09-20 DIAGNOSIS — D2262 Melanocytic nevi of left upper limb, including shoulder: Secondary | ICD-10-CM | POA: Diagnosis not present

## 2023-09-20 DIAGNOSIS — D225 Melanocytic nevi of trunk: Secondary | ICD-10-CM | POA: Diagnosis not present

## 2023-09-20 DIAGNOSIS — D1801 Hemangioma of skin and subcutaneous tissue: Secondary | ICD-10-CM | POA: Diagnosis not present

## 2023-09-20 DIAGNOSIS — L718 Other rosacea: Secondary | ICD-10-CM | POA: Diagnosis not present

## 2023-09-20 DIAGNOSIS — Z85828 Personal history of other malignant neoplasm of skin: Secondary | ICD-10-CM | POA: Diagnosis not present

## 2023-09-20 DIAGNOSIS — L821 Other seborrheic keratosis: Secondary | ICD-10-CM | POA: Diagnosis not present

## 2023-09-20 DIAGNOSIS — D2371 Other benign neoplasm of skin of right lower limb, including hip: Secondary | ICD-10-CM | POA: Diagnosis not present

## 2023-09-20 MED ORDER — METRONIDAZOLE 0.75 % EX GEL
1.0000 | Freq: Every day | CUTANEOUS | 2 refills | Status: AC
  Filled 2023-09-20: qty 45, 30d supply, fill #0

## 2023-09-21 DIAGNOSIS — J3081 Allergic rhinitis due to animal (cat) (dog) hair and dander: Secondary | ICD-10-CM | POA: Diagnosis not present

## 2023-09-21 DIAGNOSIS — J301 Allergic rhinitis due to pollen: Secondary | ICD-10-CM | POA: Diagnosis not present

## 2023-09-21 DIAGNOSIS — J3089 Other allergic rhinitis: Secondary | ICD-10-CM | POA: Diagnosis not present

## 2023-09-23 ENCOUNTER — Encounter: Payer: Self-pay | Admitting: Internal Medicine

## 2023-09-24 ENCOUNTER — Other Ambulatory Visit (HOSPITAL_COMMUNITY): Payer: Self-pay

## 2023-09-24 DIAGNOSIS — R5383 Other fatigue: Secondary | ICD-10-CM | POA: Diagnosis not present

## 2023-09-24 DIAGNOSIS — G4700 Insomnia, unspecified: Secondary | ICD-10-CM | POA: Diagnosis not present

## 2023-09-24 DIAGNOSIS — B2790 Infectious mononucleosis, unspecified without complication: Secondary | ICD-10-CM | POA: Diagnosis not present

## 2023-09-24 DIAGNOSIS — Z1321 Encounter for screening for nutritional disorder: Secondary | ICD-10-CM | POA: Diagnosis not present

## 2023-09-24 DIAGNOSIS — Z1329 Encounter for screening for other suspected endocrine disorder: Secondary | ICD-10-CM | POA: Diagnosis not present

## 2023-09-24 DIAGNOSIS — N951 Menopausal and female climacteric states: Secondary | ICD-10-CM | POA: Diagnosis not present

## 2023-09-24 DIAGNOSIS — E559 Vitamin D deficiency, unspecified: Secondary | ICD-10-CM | POA: Diagnosis not present

## 2023-09-24 DIAGNOSIS — M170 Bilateral primary osteoarthritis of knee: Secondary | ICD-10-CM | POA: Diagnosis not present

## 2023-09-24 MED ORDER — VALACYCLOVIR HCL 1 G PO TABS
1000.0000 mg | ORAL_TABLET | Freq: Two times a day (BID) | ORAL | 1 refills | Status: AC
  Filled 2023-09-24: qty 60, 30d supply, fill #0

## 2023-09-25 ENCOUNTER — Other Ambulatory Visit: Payer: Self-pay

## 2023-09-25 ENCOUNTER — Other Ambulatory Visit (HOSPITAL_COMMUNITY): Payer: Self-pay

## 2023-09-25 DIAGNOSIS — J3081 Allergic rhinitis due to animal (cat) (dog) hair and dander: Secondary | ICD-10-CM | POA: Diagnosis not present

## 2023-09-25 DIAGNOSIS — J3089 Other allergic rhinitis: Secondary | ICD-10-CM | POA: Diagnosis not present

## 2023-09-25 DIAGNOSIS — J301 Allergic rhinitis due to pollen: Secondary | ICD-10-CM | POA: Diagnosis not present

## 2023-09-28 DIAGNOSIS — J3081 Allergic rhinitis due to animal (cat) (dog) hair and dander: Secondary | ICD-10-CM | POA: Diagnosis not present

## 2023-09-28 DIAGNOSIS — J3089 Other allergic rhinitis: Secondary | ICD-10-CM | POA: Diagnosis not present

## 2023-09-28 DIAGNOSIS — J301 Allergic rhinitis due to pollen: Secondary | ICD-10-CM | POA: Diagnosis not present

## 2023-10-01 DIAGNOSIS — J3089 Other allergic rhinitis: Secondary | ICD-10-CM | POA: Diagnosis not present

## 2023-10-01 DIAGNOSIS — J301 Allergic rhinitis due to pollen: Secondary | ICD-10-CM | POA: Diagnosis not present

## 2023-10-01 DIAGNOSIS — J3081 Allergic rhinitis due to animal (cat) (dog) hair and dander: Secondary | ICD-10-CM | POA: Diagnosis not present

## 2023-10-05 DIAGNOSIS — J3089 Other allergic rhinitis: Secondary | ICD-10-CM | POA: Diagnosis not present

## 2023-10-05 DIAGNOSIS — J3081 Allergic rhinitis due to animal (cat) (dog) hair and dander: Secondary | ICD-10-CM | POA: Diagnosis not present

## 2023-10-05 DIAGNOSIS — J301 Allergic rhinitis due to pollen: Secondary | ICD-10-CM | POA: Diagnosis not present

## 2023-10-12 DIAGNOSIS — J301 Allergic rhinitis due to pollen: Secondary | ICD-10-CM | POA: Diagnosis not present

## 2023-10-12 DIAGNOSIS — J3081 Allergic rhinitis due to animal (cat) (dog) hair and dander: Secondary | ICD-10-CM | POA: Diagnosis not present

## 2023-10-12 DIAGNOSIS — J3089 Other allergic rhinitis: Secondary | ICD-10-CM | POA: Diagnosis not present

## 2023-10-18 DIAGNOSIS — J3089 Other allergic rhinitis: Secondary | ICD-10-CM | POA: Diagnosis not present

## 2023-10-18 DIAGNOSIS — J3081 Allergic rhinitis due to animal (cat) (dog) hair and dander: Secondary | ICD-10-CM | POA: Diagnosis not present

## 2023-10-18 DIAGNOSIS — J301 Allergic rhinitis due to pollen: Secondary | ICD-10-CM | POA: Diagnosis not present

## 2023-10-26 DIAGNOSIS — J301 Allergic rhinitis due to pollen: Secondary | ICD-10-CM | POA: Diagnosis not present

## 2023-10-26 DIAGNOSIS — J3089 Other allergic rhinitis: Secondary | ICD-10-CM | POA: Diagnosis not present

## 2023-10-26 DIAGNOSIS — J3081 Allergic rhinitis due to animal (cat) (dog) hair and dander: Secondary | ICD-10-CM | POA: Diagnosis not present

## 2023-11-05 ENCOUNTER — Other Ambulatory Visit: Payer: Self-pay

## 2023-11-05 ENCOUNTER — Other Ambulatory Visit (HOSPITAL_COMMUNITY): Payer: Self-pay

## 2023-11-05 MED ORDER — ESTRADIOL 0.5 MG PO TABS
0.5000 mg | ORAL_TABLET | Freq: Every day | ORAL | 3 refills | Status: AC
  Filled 2023-11-05 – 2023-11-06 (×3): qty 30, 30d supply, fill #0

## 2023-11-05 MED ORDER — PROGESTERONE MICRONIZED 100 MG PO CAPS
100.0000 mg | ORAL_CAPSULE | Freq: Every day | ORAL | 3 refills | Status: AC
  Filled 2023-11-05 – 2023-11-06 (×3): qty 30, 30d supply, fill #0
  Filled 2023-11-30: qty 30, 30d supply, fill #1
  Filled 2023-12-27 – 2023-12-28 (×2): qty 30, 30d supply, fill #2

## 2023-11-06 ENCOUNTER — Other Ambulatory Visit (HOSPITAL_COMMUNITY): Payer: Self-pay

## 2023-11-06 ENCOUNTER — Other Ambulatory Visit: Payer: Self-pay

## 2023-11-09 ENCOUNTER — Other Ambulatory Visit (HOSPITAL_COMMUNITY): Payer: Self-pay

## 2023-11-09 DIAGNOSIS — J301 Allergic rhinitis due to pollen: Secondary | ICD-10-CM | POA: Diagnosis not present

## 2023-11-09 DIAGNOSIS — J3089 Other allergic rhinitis: Secondary | ICD-10-CM | POA: Diagnosis not present

## 2023-11-09 DIAGNOSIS — J3081 Allergic rhinitis due to animal (cat) (dog) hair and dander: Secondary | ICD-10-CM | POA: Diagnosis not present

## 2023-11-09 MED ORDER — ESTRADIOL 0.025 MG/24HR TD PTTW
1.0000 | MEDICATED_PATCH | TRANSDERMAL | 6 refills | Status: AC
  Filled 2023-11-09: qty 8, 28d supply, fill #0
  Filled 2023-12-06: qty 8, 28d supply, fill #1
  Filled 2023-12-27 – 2023-12-28 (×2): qty 8, 28d supply, fill #2
  Filled 2024-03-27: qty 24, 84d supply, fill #3

## 2023-11-16 DIAGNOSIS — J301 Allergic rhinitis due to pollen: Secondary | ICD-10-CM | POA: Diagnosis not present

## 2023-11-16 DIAGNOSIS — J3089 Other allergic rhinitis: Secondary | ICD-10-CM | POA: Diagnosis not present

## 2023-11-16 DIAGNOSIS — J3081 Allergic rhinitis due to animal (cat) (dog) hair and dander: Secondary | ICD-10-CM | POA: Diagnosis not present

## 2023-11-20 DIAGNOSIS — M170 Bilateral primary osteoarthritis of knee: Secondary | ICD-10-CM | POA: Diagnosis not present

## 2023-11-20 DIAGNOSIS — M25562 Pain in left knee: Secondary | ICD-10-CM | POA: Diagnosis not present

## 2023-11-22 ENCOUNTER — Other Ambulatory Visit (HOSPITAL_COMMUNITY): Payer: Self-pay

## 2023-11-22 DIAGNOSIS — M6289 Other specified disorders of muscle: Secondary | ICD-10-CM | POA: Diagnosis not present

## 2023-11-22 DIAGNOSIS — J3081 Allergic rhinitis due to animal (cat) (dog) hair and dander: Secondary | ICD-10-CM | POA: Diagnosis not present

## 2023-11-22 DIAGNOSIS — J3089 Other allergic rhinitis: Secondary | ICD-10-CM | POA: Diagnosis not present

## 2023-11-22 DIAGNOSIS — J301 Allergic rhinitis due to pollen: Secondary | ICD-10-CM | POA: Diagnosis not present

## 2023-11-22 DIAGNOSIS — Q6671 Congenital pes cavus, right foot: Secondary | ICD-10-CM | POA: Diagnosis not present

## 2023-11-22 DIAGNOSIS — M722 Plantar fascial fibromatosis: Secondary | ICD-10-CM | POA: Diagnosis not present

## 2023-11-22 MED ORDER — TRAMADOL HCL 50 MG PO TABS
50.0000 mg | ORAL_TABLET | Freq: Three times a day (TID) | ORAL | 0 refills | Status: AC | PRN
  Filled 2023-11-22: qty 15, 5d supply, fill #0

## 2023-11-28 DIAGNOSIS — M722 Plantar fascial fibromatosis: Secondary | ICD-10-CM | POA: Diagnosis not present

## 2023-11-28 DIAGNOSIS — M79671 Pain in right foot: Secondary | ICD-10-CM | POA: Diagnosis not present

## 2023-11-29 DIAGNOSIS — J301 Allergic rhinitis due to pollen: Secondary | ICD-10-CM | POA: Diagnosis not present

## 2023-11-29 DIAGNOSIS — J3089 Other allergic rhinitis: Secondary | ICD-10-CM | POA: Diagnosis not present

## 2023-11-29 DIAGNOSIS — J3081 Allergic rhinitis due to animal (cat) (dog) hair and dander: Secondary | ICD-10-CM | POA: Diagnosis not present

## 2023-11-30 ENCOUNTER — Other Ambulatory Visit (HOSPITAL_COMMUNITY): Payer: Self-pay

## 2023-12-03 ENCOUNTER — Other Ambulatory Visit (HOSPITAL_COMMUNITY): Payer: Self-pay

## 2023-12-03 ENCOUNTER — Encounter (HOSPITAL_COMMUNITY): Payer: Self-pay

## 2023-12-03 DIAGNOSIS — L6612 Frontal fibrosing alopecia: Secondary | ICD-10-CM | POA: Diagnosis not present

## 2023-12-05 ENCOUNTER — Other Ambulatory Visit: Payer: Self-pay

## 2023-12-05 ENCOUNTER — Other Ambulatory Visit (HOSPITAL_COMMUNITY): Payer: Self-pay

## 2023-12-05 DIAGNOSIS — M722 Plantar fascial fibromatosis: Secondary | ICD-10-CM | POA: Diagnosis not present

## 2023-12-05 MED ORDER — BETAMETHASONE DIPROPIONATE 0.05 % EX LOTN
TOPICAL_LOTION | CUTANEOUS | 4 refills | Status: AC
  Filled 2023-12-05: qty 60, 30d supply, fill #0
  Filled 2024-02-11: qty 60, 30d supply, fill #1
  Filled 2024-03-27: qty 60, 30d supply, fill #2

## 2023-12-06 ENCOUNTER — Other Ambulatory Visit (HOSPITAL_COMMUNITY): Payer: Self-pay

## 2023-12-12 DIAGNOSIS — M722 Plantar fascial fibromatosis: Secondary | ICD-10-CM | POA: Diagnosis not present

## 2023-12-19 DIAGNOSIS — J3081 Allergic rhinitis due to animal (cat) (dog) hair and dander: Secondary | ICD-10-CM | POA: Diagnosis not present

## 2023-12-19 DIAGNOSIS — J301 Allergic rhinitis due to pollen: Secondary | ICD-10-CM | POA: Diagnosis not present

## 2023-12-19 DIAGNOSIS — J3089 Other allergic rhinitis: Secondary | ICD-10-CM | POA: Diagnosis not present

## 2023-12-25 ENCOUNTER — Ambulatory Visit: Payer: Commercial Managed Care - PPO | Admitting: Hematology

## 2023-12-25 ENCOUNTER — Other Ambulatory Visit: Payer: Commercial Managed Care - PPO

## 2023-12-27 ENCOUNTER — Other Ambulatory Visit: Payer: Self-pay

## 2024-01-02 ENCOUNTER — Other Ambulatory Visit (HOSPITAL_COMMUNITY): Payer: Self-pay

## 2024-01-02 DIAGNOSIS — J3081 Allergic rhinitis due to animal (cat) (dog) hair and dander: Secondary | ICD-10-CM | POA: Diagnosis not present

## 2024-01-02 DIAGNOSIS — J3089 Other allergic rhinitis: Secondary | ICD-10-CM | POA: Diagnosis not present

## 2024-01-02 DIAGNOSIS — J301 Allergic rhinitis due to pollen: Secondary | ICD-10-CM | POA: Diagnosis not present

## 2024-01-02 MED ORDER — PROGESTERONE MICRONIZED 100 MG PO CAPS
100.0000 mg | ORAL_CAPSULE | Freq: Every day | ORAL | 0 refills | Status: AC
  Filled 2024-01-02 – 2024-02-11 (×2): qty 30, 30d supply, fill #0

## 2024-01-02 MED ORDER — PROGESTERONE MICRONIZED 100 MG PO CAPS
100.0000 mg | ORAL_CAPSULE | Freq: Every day | ORAL | 3 refills | Status: AC
  Filled 2024-01-13: qty 90, 90d supply, fill #0
  Filled 2024-01-15: qty 15, 15d supply, fill #0
  Filled 2024-01-15: qty 30, 30d supply, fill #0
  Filled 2024-03-27: qty 90, 90d supply, fill #1

## 2024-01-03 ENCOUNTER — Other Ambulatory Visit (HOSPITAL_COMMUNITY): Payer: Self-pay

## 2024-01-06 IMAGING — CT CT CARDIAC CORONARY ARTERY CALCIUM SCORE
2 of 3 series · 10 of 20 positions shown, 12 images · non-contrast
Comparison: Prior CT of the chest on 12/11/2014 and calcium score
study on 10/16/2013

CLINICAL DATA: 66-year-old Caucasian female with history of
hyperlipidemia and family history of heart disease.

EXAM:
CT CARDIAC CORONARY ARTERY CALCIUM SCORE
TECHNIQUE: Non-contrast imaging through the heart was performed using
prospective ECG gating. Image post processing was performed on an
independent workstation, allowing for quantitative analysis of the
heart and coronary arteries. Note that this exam targets the heart
and the chest was not imaged in its entirety.

[Series 3: calcium scoring 2.00 br40 bestdiast 72% axial · axial · 0.47mm/px · z∈[+1431,+1551]mm · 5 of 90 slices shown, 7 images]
[im 15/90  vessel]
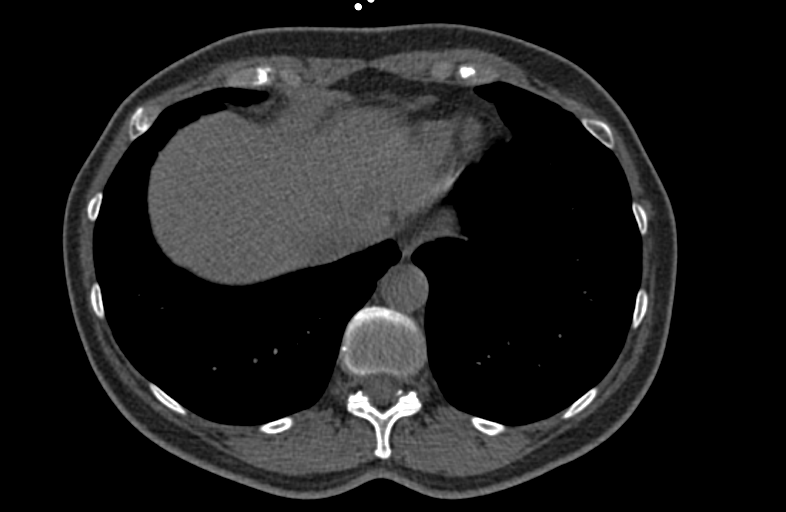
[im 15/90  lung]
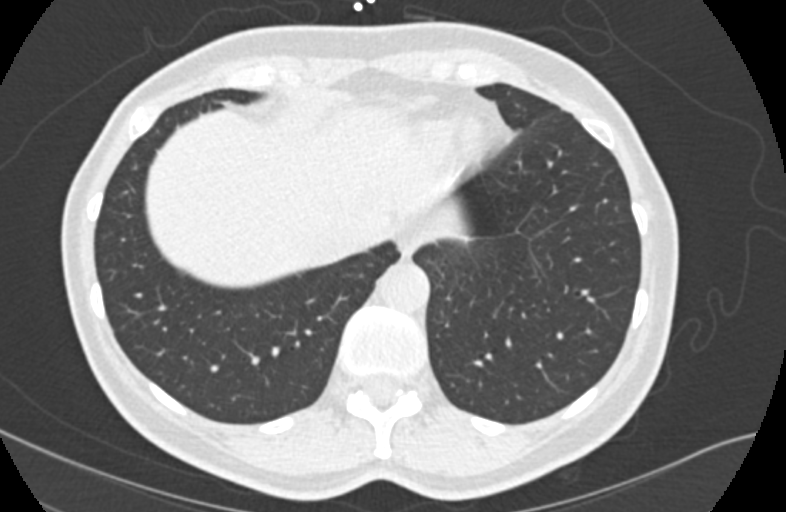
[im 30/90  vessel]
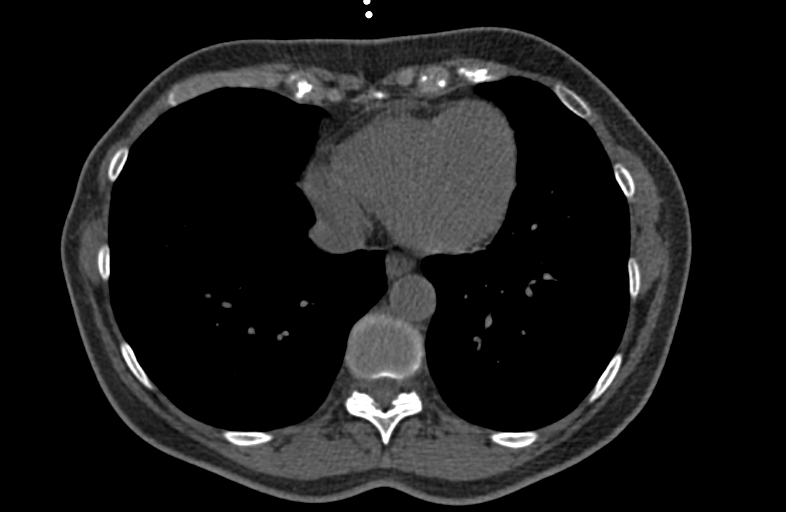
[im 45/90  vessel]
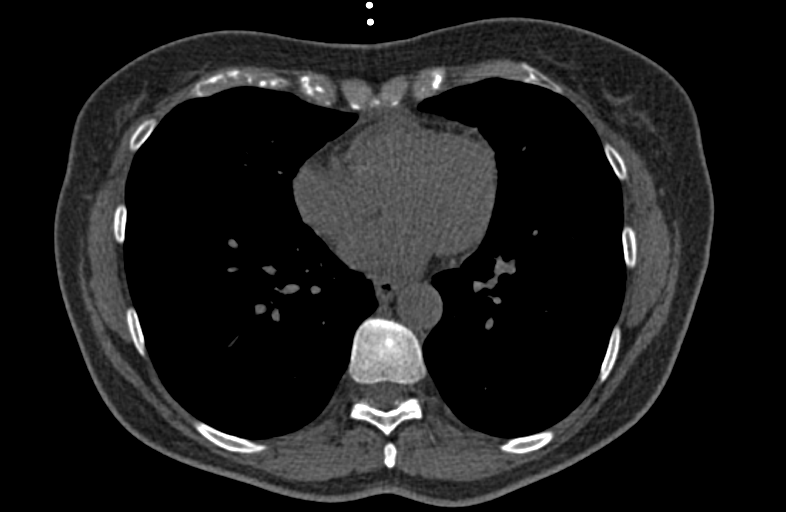
[im 60/90  vessel]
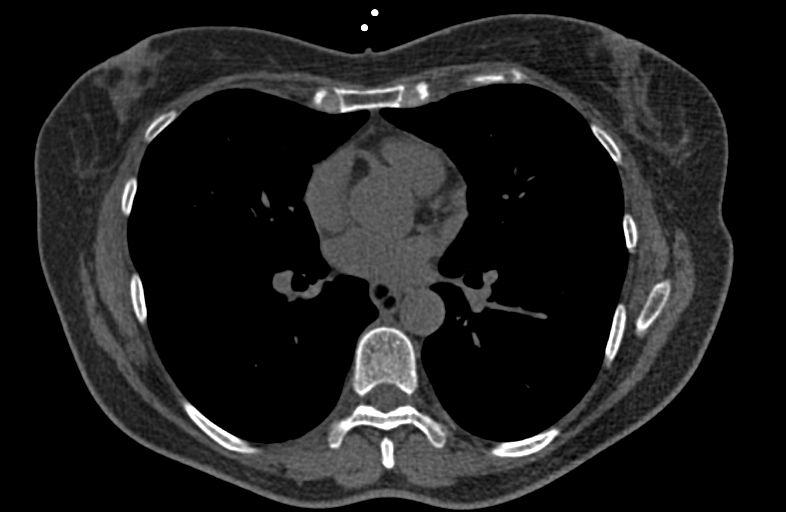
[im 75/90  vessel]
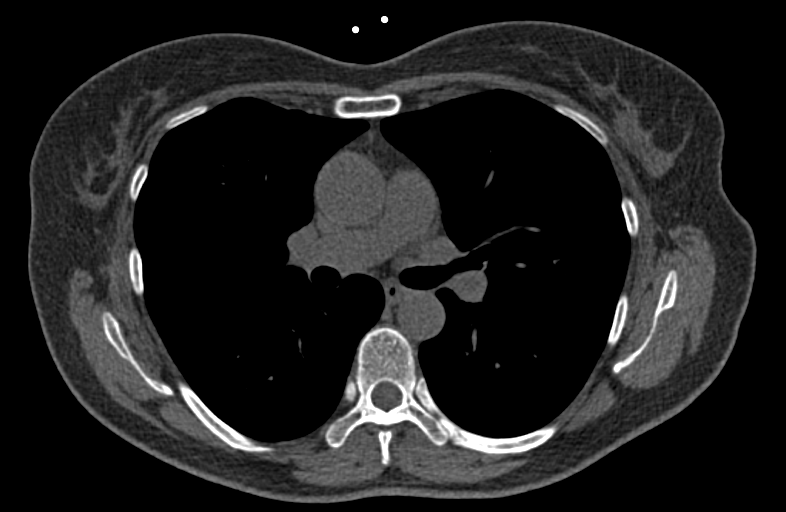
[im 75/90  lung]
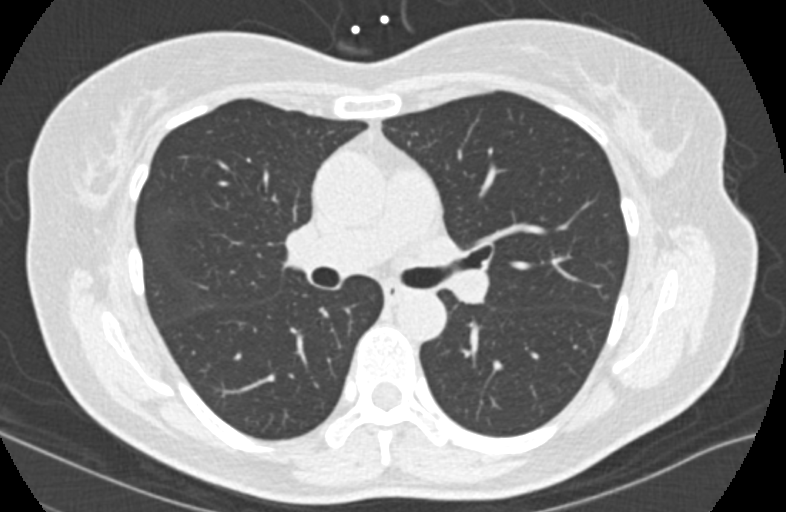

[Series 9: calcium scoring 2.00 br60 bestdiast 72% lungs · axial · 0.47mm/px · z∈[+1431,+1551]mm · 5 of 90 slices shown]
[im 15/90  vessel]
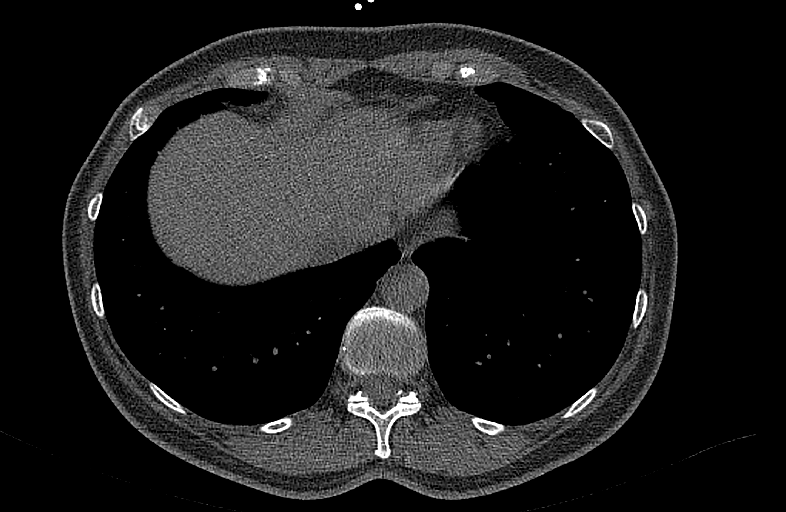
[im 30/90  vessel]
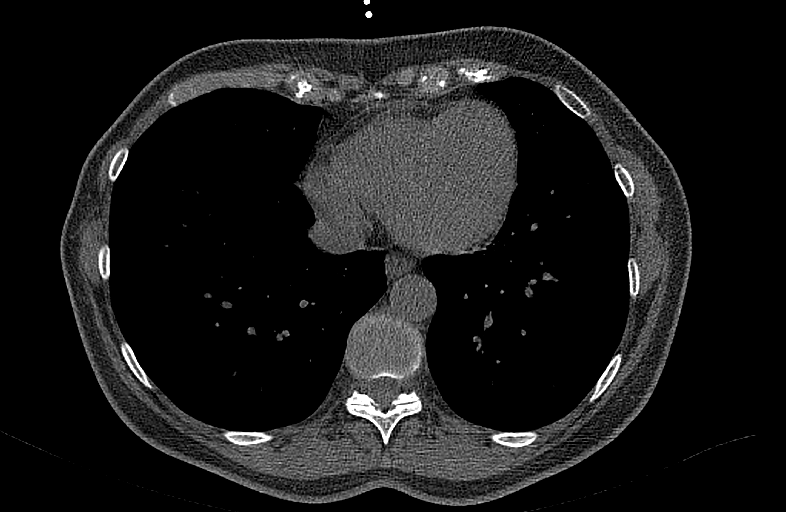
[im 45/90  vessel]
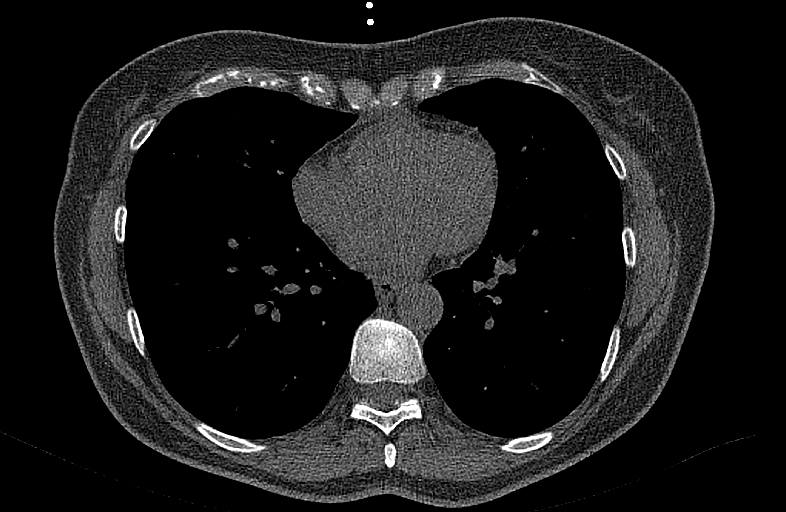
[im 60/90  vessel]
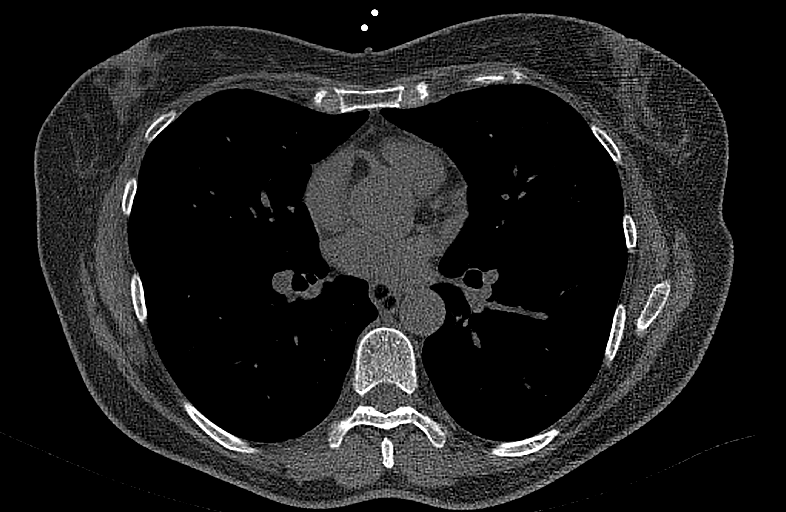
[im 75/90  vessel]
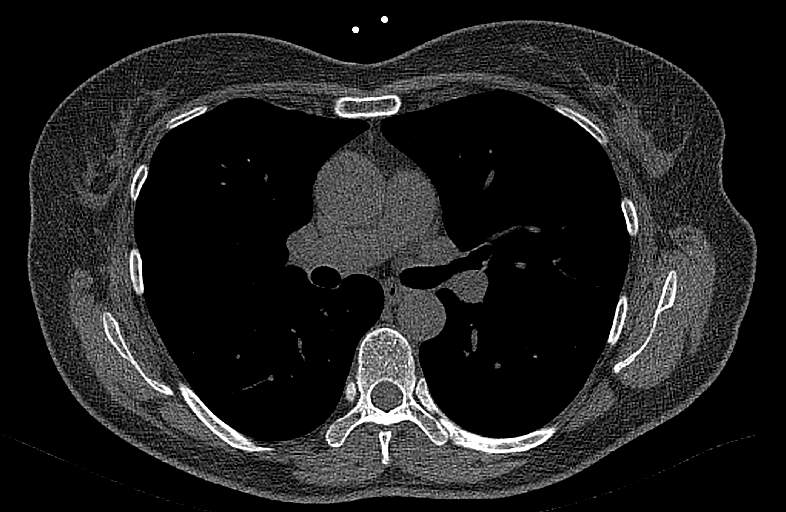

[10 of 20 positions shown; findings below may reference images not displayed]

FINDINGS: CORONARY CALCIUM SCORES:

Left Main: 0

LAD: 0

LCx: 0

RCA: 0

Total Agatston Score: 0

[HOSPITAL] percentile: 0

AORTA MEASUREMENTS:

Ascending Aorta: 30 mm

Descending Aorta: 21 mm

OTHER FINDINGS:

The heart size is within normal limits. No pericardial fluid is
identified. Visualized segments of the thoracic aorta and central
pulmonary arteries are normal in caliber. Visualized mediastinum and
hilar regions demonstrate no lymphadenopathy or masses. Stable 4 mm
nodule in the posteromedial left lower lobe on image 82 measures 4
mm. This is stable since 2828 and therefore benign. Stable areas of
small fissural thickening in the right major fissure. Visualized
lungs show no evidence of pulmonary edema, consolidation,
pneumothorax or pleural fluid. Visualized upper abdomen and bony
structures are unremarkable.
IMPRESSION: 1. Coronary calcium score of 0.
2. Stable benign 4 mm left lower lobe nodule and small areas of
fissural thickening in the right major fissure.

## 2024-01-10 DIAGNOSIS — M25562 Pain in left knee: Secondary | ICD-10-CM | POA: Diagnosis not present

## 2024-01-10 DIAGNOSIS — M1712 Unilateral primary osteoarthritis, left knee: Secondary | ICD-10-CM | POA: Diagnosis not present

## 2024-01-13 ENCOUNTER — Other Ambulatory Visit: Payer: Self-pay | Admitting: Internal Medicine

## 2024-01-13 ENCOUNTER — Other Ambulatory Visit (HOSPITAL_COMMUNITY): Payer: Self-pay

## 2024-01-14 ENCOUNTER — Other Ambulatory Visit: Payer: Self-pay

## 2024-01-14 ENCOUNTER — Other Ambulatory Visit (HOSPITAL_COMMUNITY): Payer: Self-pay

## 2024-01-14 MED ORDER — HYDROCORTISONE (PERIANAL) 2.5 % EX CREA
1.0000 | TOPICAL_CREAM | Freq: Two times a day (BID) | CUTANEOUS | 2 refills | Status: AC | PRN
  Filled 2024-01-14: qty 28, 9d supply, fill #0
  Filled 2024-02-11: qty 30, 10d supply, fill #0

## 2024-01-15 ENCOUNTER — Other Ambulatory Visit (HOSPITAL_COMMUNITY): Payer: Self-pay

## 2024-01-15 DIAGNOSIS — J3089 Other allergic rhinitis: Secondary | ICD-10-CM | POA: Diagnosis not present

## 2024-01-15 DIAGNOSIS — J301 Allergic rhinitis due to pollen: Secondary | ICD-10-CM | POA: Diagnosis not present

## 2024-01-15 DIAGNOSIS — J3081 Allergic rhinitis due to animal (cat) (dog) hair and dander: Secondary | ICD-10-CM | POA: Diagnosis not present

## 2024-02-04 DIAGNOSIS — J3089 Other allergic rhinitis: Secondary | ICD-10-CM | POA: Diagnosis not present

## 2024-02-04 DIAGNOSIS — J301 Allergic rhinitis due to pollen: Secondary | ICD-10-CM | POA: Diagnosis not present

## 2024-02-04 DIAGNOSIS — J3081 Allergic rhinitis due to animal (cat) (dog) hair and dander: Secondary | ICD-10-CM | POA: Diagnosis not present

## 2024-02-11 ENCOUNTER — Other Ambulatory Visit (HOSPITAL_COMMUNITY): Payer: Self-pay

## 2024-02-11 ENCOUNTER — Other Ambulatory Visit: Payer: Self-pay

## 2024-02-12 ENCOUNTER — Other Ambulatory Visit: Payer: Self-pay

## 2024-02-18 ENCOUNTER — Other Ambulatory Visit: Payer: Self-pay

## 2024-02-27 ENCOUNTER — Encounter: Payer: Self-pay | Admitting: Internal Medicine

## 2024-02-27 DIAGNOSIS — H9190 Unspecified hearing loss, unspecified ear: Secondary | ICD-10-CM

## 2024-03-05 ENCOUNTER — Inpatient Hospital Stay

## 2024-03-05 ENCOUNTER — Inpatient Hospital Stay: Admitting: Hematology

## 2024-03-06 ENCOUNTER — Other Ambulatory Visit: Payer: Self-pay

## 2024-03-06 DIAGNOSIS — Z1239 Encounter for other screening for malignant neoplasm of breast: Secondary | ICD-10-CM

## 2024-03-10 ENCOUNTER — Other Ambulatory Visit (HOSPITAL_COMMUNITY): Payer: Self-pay

## 2024-03-10 ENCOUNTER — Inpatient Hospital Stay

## 2024-03-10 ENCOUNTER — Other Ambulatory Visit: Payer: Self-pay

## 2024-03-10 ENCOUNTER — Other Ambulatory Visit: Payer: Self-pay | Admitting: Internal Medicine

## 2024-03-10 MED ORDER — HYDROCORTISONE ACETATE 25 MG RE SUPP
25.0000 mg | Freq: Two times a day (BID) | RECTAL | 5 refills | Status: AC | PRN
  Filled 2024-03-10: qty 24, 12d supply, fill #0

## 2024-03-13 ENCOUNTER — Ambulatory Visit: Admitting: Audiologist

## 2024-03-27 ENCOUNTER — Encounter: Payer: Self-pay | Admitting: Audiology

## 2024-03-27 ENCOUNTER — Ambulatory Visit: Admitting: Audiology

## 2024-03-27 DIAGNOSIS — H903 Sensorineural hearing loss, bilateral: Secondary | ICD-10-CM

## 2024-03-27 NOTE — Procedures (Signed)
 [facility]  [address] [place], [place]  [address] [phone]  Audiological Evaluation   NAME: Maria Kane     DOB:   [birth year]      MRN: [id number]                                                                                     DATE: 03/27/2024     DIAGNOSIS: Sensorineural hearing loss in both ears  History: [name] was seen for an audiological evaluation due to concerns regarding her hearing sensitivity. [name] reports decreased hearing occurring for 1 year. [name] reports increased difficulty hearing and communicating in the presence of background noise. [name] denies tinnitus, otalgia, and aural fullness. [name] reports a history of vertigo occurring a few times a year. [name] has previously been followed by Dr. Thaddeus, Otolaryngologist for her vertigo.   Evaluation:  Otoscopy: Right ear: A clear view of the tympanic membrane was visualized left ear: A clear view of the tympanic membrane was visualized  Tympanometry:  Right ear: Type A - Normal middle ear pressure and normal tympanic membrane mobility. Findings are consistent with normal middle ear function.  Left ear: Type A - Normal middle ear pressure and normal tympanic membrane mobility. Findings are consistent with normal middle ear function.  Conventional Audiometry:  Transducer: Headphones and Insert earphones Results:  Normal hearing sensitivity sloping to a moderate sensorineural hearing loss in the left ear and normal hearing sensitivity sloping to a moderately-severe sensorineural hearing loss in the right ear. A conductive component is noted at 250 Hz in both ears.   Speech Recognition Threshold:   Right ear:  15 dB HL left ear: 20 dB HL  Word Recognition Testing:   Right ear:  100 % at 65 dB HL Left ear: 100 % at 65 dB HL  Word List: [id number] Recorded  Results:  Today's test results show normal hearing sensitivity sloping to a moderate sensorineural hearing loss in the left ear and normal  hearing sensitivity sloping to a moderately-severe sensorineural hearing loss in the right ear. A conductive component is noted at 250 Hz in both ears. [name] may have communication challenges in difficult listening environments, such as situations with background noise, distance, or limited visual cues. While [name] is not a hearing aid candidate at this time, consistent use of effective communication strategies is recommended to support communication success.  Recommendations: Monitor hearing sensitivity - Return in 2 years for an audiological evaluation.     35 minutes spent testing and counseling on results. The Audiogram can be found under the media file.   If you have any questions please feel free to contact me at [phone].  Darryle Posey 03/27/2024  12:20 PM

## 2024-03-28 ENCOUNTER — Other Ambulatory Visit: Payer: Self-pay

## 2024-03-28 ENCOUNTER — Other Ambulatory Visit (HOSPITAL_COMMUNITY): Payer: Self-pay

## 2024-03-31 ENCOUNTER — Inpatient Hospital Stay: Admitting: Hematology

## 2024-03-31 ENCOUNTER — Inpatient Hospital Stay

## 2024-04-21 ENCOUNTER — Encounter: Payer: Commercial Managed Care - PPO | Admitting: Internal Medicine

## 2024-05-15 ENCOUNTER — Inpatient Hospital Stay: Admitting: Hematology

## 2024-05-15 ENCOUNTER — Inpatient Hospital Stay
